# Patient Record
Sex: Female | Born: 1970 | Race: White | Hispanic: No | Marital: Married | Smoking: Former smoker
Health system: Southern US, Community
[De-identification: ages and names within clinical notes are randomized; demographics above are authoritative.]

## PROBLEM LIST (undated history)

## (undated) DIAGNOSIS — F411 Generalized anxiety disorder: Secondary | ICD-10-CM

## (undated) DIAGNOSIS — K219 Gastro-esophageal reflux disease without esophagitis: Secondary | ICD-10-CM

## (undated) DIAGNOSIS — M1990 Unspecified osteoarthritis, unspecified site: Secondary | ICD-10-CM

## (undated) DIAGNOSIS — F329 Major depressive disorder, single episode, unspecified: Secondary | ICD-10-CM

## (undated) DIAGNOSIS — F32A Depression, unspecified: Secondary | ICD-10-CM

## (undated) DIAGNOSIS — F419 Anxiety disorder, unspecified: Secondary | ICD-10-CM

## (undated) DIAGNOSIS — K449 Diaphragmatic hernia without obstruction or gangrene: Secondary | ICD-10-CM

## (undated) DIAGNOSIS — I10 Essential (primary) hypertension: Secondary | ICD-10-CM

## (undated) DIAGNOSIS — C4491 Basal cell carcinoma of skin, unspecified: Secondary | ICD-10-CM

## (undated) DIAGNOSIS — R011 Cardiac murmur, unspecified: Secondary | ICD-10-CM

## (undated) DIAGNOSIS — Z5189 Encounter for other specified aftercare: Secondary | ICD-10-CM

## (undated) DIAGNOSIS — D649 Anemia, unspecified: Secondary | ICD-10-CM

## (undated) DIAGNOSIS — G2581 Restless legs syndrome: Secondary | ICD-10-CM

## (undated) DIAGNOSIS — C4490 Unspecified malignant neoplasm of skin, unspecified: Secondary | ICD-10-CM

## (undated) DIAGNOSIS — Z87891 Personal history of nicotine dependence: Secondary | ICD-10-CM

## (undated) HISTORY — DX: Encounter for other specified aftercare: Z51.89

## (undated) HISTORY — PX: WISDOM TOOTH EXTRACTION: SHX21

## (undated) HISTORY — DX: Gastro-esophageal reflux disease without esophagitis: K21.9

## (undated) HISTORY — DX: Unspecified malignant neoplasm of skin, unspecified: C44.90

## (undated) HISTORY — DX: Anemia, unspecified: D64.9

## (undated) HISTORY — DX: Basal cell carcinoma of skin, unspecified: C44.91

## (undated) HISTORY — DX: Cardiac murmur, unspecified: R01.1

## (undated) HISTORY — PX: STRABISMUS SURGERY: SHX218

## (undated) HISTORY — PX: EYE SURGERY: SHX253

## (undated) HISTORY — DX: Depression, unspecified: F32.A

## (undated) HISTORY — DX: Unspecified osteoarthritis, unspecified site: M19.90

## (undated) HISTORY — DX: Major depressive disorder, single episode, unspecified: F32.9

## (undated) HISTORY — PX: CERVICAL ABLATION: SHX5771

## (undated) HISTORY — DX: Anxiety disorder, unspecified: F41.9

---

## 1996-04-21 HISTORY — PX: BUNIONECTOMY: SHX129

## 1999-04-22 HISTORY — PX: NASAL SINUS SURGERY: SHX719

## 2007-03-16 ENCOUNTER — Emergency Department: Payer: Self-pay | Admitting: Emergency Medicine

## 2007-03-16 ENCOUNTER — Other Ambulatory Visit: Payer: Self-pay

## 2007-06-11 ENCOUNTER — Ambulatory Visit: Payer: Self-pay

## 2007-06-19 ENCOUNTER — Emergency Department: Payer: Self-pay | Admitting: Internal Medicine

## 2007-06-19 ENCOUNTER — Other Ambulatory Visit: Payer: Self-pay

## 2011-05-31 ENCOUNTER — Ambulatory Visit: Payer: Self-pay

## 2013-02-18 ENCOUNTER — Ambulatory Visit: Payer: Self-pay | Admitting: Physician Assistant

## 2013-02-18 LAB — RAPID STREP-A WITH REFLX: Micro Text Report: NEGATIVE

## 2013-02-20 LAB — BETA STREP CULTURE(ARMC)

## 2015-06-12 ENCOUNTER — Ambulatory Visit
Admission: EM | Admit: 2015-06-12 | Discharge: 2015-06-12 | Disposition: A | Discharge status: Home / Self Care | Payer: BLUE CROSS/BLUE SHIELD | Attending: Family Medicine | Admitting: Family Medicine

## 2015-06-12 ENCOUNTER — Encounter: Payer: Self-pay | Admitting: *Deleted

## 2015-06-12 ENCOUNTER — Ambulatory Visit (INDEPENDENT_AMBULATORY_CARE_PROVIDER_SITE_OTHER): Payer: BLUE CROSS/BLUE SHIELD

## 2015-06-12 DIAGNOSIS — M5030 Other cervical disc degeneration, unspecified cervical region: Secondary | ICD-10-CM

## 2015-06-12 DIAGNOSIS — M5412 Radiculopathy, cervical region: Secondary | ICD-10-CM

## 2015-06-12 DIAGNOSIS — M6249 Contracture of muscle, multiple sites: Secondary | ICD-10-CM

## 2015-06-12 DIAGNOSIS — M62838 Other muscle spasm: Secondary | ICD-10-CM

## 2015-06-12 HISTORY — DX: Essential (primary) hypertension: I10

## 2015-06-12 MED ORDER — NAPROXEN 500 MG PO TABS
500.0000 mg | ORAL_TABLET | Freq: Two times a day (BID) | ORAL | Status: DC
Start: 2015-06-12 — End: 2016-01-23

## 2015-06-12 MED ORDER — KETOROLAC TROMETHAMINE 60 MG/2ML IM SOLN: 60.0000 mg | Freq: Once | INTRAMUSCULAR | Status: DC

## 2015-06-12 MED ORDER — DIAZEPAM 2 MG PO TABS: 2.0000 mg | ORAL_TABLET | Freq: Three times a day (TID) | ORAL | Status: DC

## 2015-06-12 MED ORDER — METAXALONE 800 MG PO TABS: 800.0000 mg | ORAL_TABLET | Freq: Three times a day (TID) | ORAL | Status: DC

## 2015-06-12 MED ORDER — KETOROLAC TROMETHAMINE 60 MG/2ML IM SOLN
60.0000 mg | Freq: Once | INTRAMUSCULAR | Status: AC
  Administered 2015-06-12: 60 mg via INTRAMUSCULAR

## 2015-06-12 NOTE — Discharge Instructions (Signed)
Cervical Radiculopathy Cervical radiculopathy happens when a nerve in the neck (cervical nerve) is pinched or bruised. This condition can develop because of an injury or as part of the normal aging process. Pressure on the cervical nerves can cause pain or numbness that runs from the neck all the way down into the arm and fingers. Usually, this condition gets better with rest. Treatment may be needed if the condition does not improve.  CAUSES This condition may be caused by:  Injury.  Slipped (herniated) disk.  Muscle tightness in the neck because of overuse.  Arthritis.  Breakdown or degeneration in the bones and joints of the spine (spondylosis) due to aging.  Bone spurs that may develop near the cervical nerves. SYMPTOMS Symptoms of this condition include:  Pain that runs from the neck to the arm and hand. The pain can be severe or irritating. It may be worse when the neck is moved.  Numbness or weakness in the affected arm and hand. DIAGNOSIS This condition may be diagnosed based on symptoms, medical history, and a physical exam. You may also have tests, including:  X-rays.  CT scan.  MRI.  Electromyogram (EMG).  Nerve conduction tests. TREATMENT In many cases, treatment is not needed for this condition. With rest, the condition usually gets better over time. If treatment is needed, options may include:  Wearing a soft neck collar for short periods of time.  Physical therapy to strengthen your neck muscles.  Medicines, such as NSAIDs, oral corticosteroids, or spinal injections.  Surgery. This may be needed if other treatments do not help. Various types of surgery may be done depending on the cause of your problems. HOME CARE INSTRUCTIONS Managing Pain  Take over-the-counter and prescription medicines only as told by your health care provider.  If directed, apply ice to the affected area.  Put ice in a plastic bag.  Place a towel between your skin and the  bag.  Leave the ice on for 20 minutes, 2-3 times per day.  If ice does not help, you can try using heat. Take a warm shower or warm bath, or use a heat pack as told by your health care provider.  Try a gentle neck and shoulder massage to help relieve symptoms. Activity  Rest as needed. Follow instructions from your health care provider about any restrictions on activities.  Do stretching and strengthening exercises as told by your health care provider or physical therapist. General Instructions  If you were given a soft collar, wear it as told by your health care provider.  Use a flat pillow when you sleep.  Keep all follow-up visits as told by your health care provider. This is important. SEEK MEDICAL CARE IF:  Your condition does not improve with treatment. SEEK IMMEDIATE MEDICAL CARE IF:  Your pain gets much worse and cannot be controlled with medicines.  You have weakness or numbness in your hand, arm, face, or leg.  You have a high fever.  You have a stiff, rigid neck.  You lose control of your bowels or your bladder (have incontinence).  You have trouble with walking, balance, or speaking.   This information is not intended to replace advice given to you by your health care provider. Make sure you discuss any questions you have with your health care provider.   Document Released: 12/31/2000 Document Revised: 12/27/2014 Document Reviewed: 06/01/2014 Elsevier Interactive Patient Education 2016 Elsevier Inc.  Degenerative Disk Disease Degenerative disk disease is a condition caused by the changes  that occur in spinal disks as you grow older. Spinal disks are soft and compressible disks located between the bones of your spine (vertebrae). These disks act like shock absorbers. Degenerative disk disease can affect the whole spine. However, the neck and lower back are most commonly affected. Many changes can occur in the spinal disks with aging, such as:  The spinal disks  may dry and shrink.  Small tears may occur in the tough, outer covering of the disk (annulus).  The disk space may become smaller due to loss of water.  Abnormal growths in the bone (spurs) may occur. This can put pressure on the nerve roots exiting the spinal canal, causing pain.  The spinal canal may become narrowed. RISK FACTORS   Being overweight.  Having a family history of degenerative disk disease.  Smoking.  There is increased risk if you are doing heavy lifting or have a sudden injury. SIGNS AND SYMPTOMS  Symptoms vary from person to person and may include:  Pain that varies in intensity. Some people have no pain, while others have severe pain. The location of the pain depends on the part of your backbone that is affected.  You will have neck or arm pain if a disk in the neck area is affected.  You will have pain in your back, buttocks, or legs if a disk in the lower back is affected.  Pain that becomes worse while bending, reaching up, or with twisting movements.  Pain that may start gradually and then get worse as time passes. It may also start after a major or minor injury.  Numbness or tingling in the arms or legs. DIAGNOSIS  Your health care provider will ask you about your symptoms and about activities or habits that may cause the pain. He or she may also ask about any injuries, diseases, or treatments you have had. Your health care provider will examine you to check for the range of movement that is possible in the affected area, to check for strength in your extremities, and to check for sensation in the areas of the arms and legs supplied by different nerve roots. You may also have:   An X-ray of the spine.  Other imaging tests, such as MRI. TREATMENT  Your health care provider will advise you on the best plan for treatment. Treatment may include:  Medicines.  Rehabilitation exercises. HOME CARE INSTRUCTIONS   Follow proper lifting and walking  techniques as advised by your health care provider.  Maintain good posture.  Exercise regularly as advised by your health care provider.  Perform relaxation exercises.  Change your sitting, standing, and sleeping habits as advised by your health care provider.  Change positions frequently.  Lose weight or maintain a healthy weight as advised by your health care provider.  Do not use any tobacco products, including cigarettes, chewing tobacco, or electronic cigarettes. If you need help quitting, ask your health care provider.  Wear supportive footwear.  Take medicines only as directed by your health care provider. SEEK MEDICAL CARE IF:   Your pain does not go away within 1-4 weeks.  You have significant appetite or weight loss. SEEK IMMEDIATE MEDICAL CARE IF:   Your pain is severe.  You notice weakness in your arms, hands, or legs.  You begin to lose control of your bladder or bowel movements.  You have fevers or night sweats. MAKE SURE YOU:   Understand these instructions.  Will watch your condition.  Will get help right away  if you are not doing well or get worse.   This information is not intended to replace advice given to you by your health care provider. Make sure you discuss any questions you have with your health care provider.   Document Released: 02/02/2007 Document Revised: 04/28/2014 Document Reviewed: 08/09/2013 Elsevier Interactive Patient Education [employer].
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
## 2015-06-12 NOTE — ED Notes (Signed)
C/o gradual onset left sided neck pain over past several months, worse past 2 nights. Denies injury, paresthesias. States feels knot at left upper neck.

## 2015-12-18 NOTE — Progress Notes (Signed)
[facility] day:  12/19/2015  Chief Complaint: Lisa Vasquez is a 45 y.o. female with iron deficiency who is referred in consultation by Pasup Roxan Hockey, PA for assessment and management.  HPI: The patient has a history of iron deficiency anemia approximately 3-4 years ago. She seen at [facility] by Dr. Barkley Bruns..  As she was intolerant to oral iron supplements, she received IV iron.  She described receiving 2 infusions 3 weeks apart  In 07/2012. She tolerated her infusions well..   She has a history of menorrhagia is status post endometrial ablation in 2004.  She has had no menses since that time. She denies any blood in her stool or urine.  She has a history of gastritis/gastric erosions on EGD.  She states that she underwent EGD and colonoscopy about 3-4 years ago in the [facility] system which were negative. She was told she had a "nice colonoscopy".  She had a hiatal hernia with gastritis. She has been on omeprazole since that time.  She notes recently seeing black tarry stools. She is not on oral iron. She gets "physically ill" if she takes iron..  Regarding her diet, she eats yogurt and fruit for breakfast.  She tries to eat healthy with salad and grilled chicken. At times, she skips meals.  She skipped dinner 3 times this week. She describes having a bag of popcorn for dinner on Sunday night. She does not eat red meat. She mostly eats chicken, [place], and salmon. She eats green leafy vegetables. She has had ice pica since 04/2015.  Symptomatically, she feels "like shit".  She is aggravated as she is limited in her exercise and activity. She has a brown belt in tae kwon do.  She walks to the mailbox and back which is 20-30 feet and feels lightheaded.  This has caused frustration and depression.  She denies any B symptoms   Past Medical History:  Diagnosis Date  . Anemia   . Hypertension   . Skin cancer     Past Surgical  History:  Procedure Laterality Date  . CERVICAL ABLATION    . EYE SURGERY    . NASAL SINUS SURGERY  2001  . STRABISMUS SURGERY Right   . WISDOM TOOTH EXTRACTION      Family History  Problem Relation Age of Onset  . COPD Mother   . Skin cancer Mother   . Heart defect Mother   . Heart attack Maternal Aunt   . Heart attack Maternal Uncle   . Breast cancer Maternal Grandmother   . Melanoma Maternal Uncle     Social History:  reports that she has quit smoking. She does not have any smokeless tobacco history on file. She reports that she drinks alcohol. Her drug history is not on file.  She has a brown belt in tae kwon do. She lives in [place].  The patient is alone today.  Allergies:  Allergies  Allergen Reactions  . Ondansetron Other (See Comments)    Constipation     Current Medications: Current Outpatient Prescriptions  Medication Sig Dispense Refill  . lisinopril (PRINIVIL,ZESTRIL) 10 MG tablet Take 10 mg by mouth daily.    . nortriptyline (PAMELOR) 75 MG capsule Take 90 mg by mouth at bedtime.    Marland Kitchen omeprazole (PRILOSEC) 40 MG capsule Take 40 mg by mouth daily.    . rizatriptan (MAXALT) 10 MG tablet Take 10 mg by mouth as needed for migraine. May  repeat in 2 hours if needed    . sertraline (ZOLOFT) 100 MG tablet Take 200 mg by mouth daily.    . simvastatin (ZOCOR) 40 MG tablet Take 40 mg by mouth daily.    . diazepam (VALIUM) 2 MG tablet Take 1 tablet (2 mg total) by mouth 3 (three) times daily. (Patient not taking: Reported on 12/19/2015) 6 tablet 0  . metaxalone (SKELAXIN) 800 MG tablet Take 1 tablet (800 mg total) by mouth 3 (three) times daily. Start taking after finishing the valium for muscle spasm. (Patient not taking: Reported on 12/19/2015) 21 tablet 0  . naproxen (NAPROSYN) 500 MG tablet Take 1 tablet (500 mg total) by mouth 2 (two) times daily with a meal. (Patient not taking: Reported on 12/19/2015) 60 tablet 0   No current facility-administered medications for this  visit.     Review of Systems:  GENERAL:  Fatigued.  No fevers, sweats or weight loss. PERFORMANCE STATUS (ECOG):  2 HEENT:  No visual changes, runny nose, sore throat, mouth sores or tenderness. Lungs: No shortness of breath or cough.  No hemoptysis. Cardiac:  No chest pain, palpitations, orthopnea, or PND. GI:  Recent black tarry stools.  Nausea in AM.  Water emesis.  No diarrhea, constipation, or hematochezia. GU:  No menses s/p uterine ablation.  No urgency, frequency, dysuria, or hematuria. Musculoskeletal:  No back pain.  Arthritis in neck.  No muscle tenderness. Extremities:  No pain or swelling. Skin:  No rashes or skin changes. Neuro:  Lightheaded with activity.  No headache, numbness or weakness, balance or coordination issues. Endocrine:  No diabetes, thyroid issues.  Hot flashes- takes black cohash.  No night sweats. Psych:  Aggravated.  Depression.  No anxiety. Pain:  No focal pain. Review of systems:  All other systems reviewed and found to be negative.  Physical Exam: Blood pressure (!) 162/114, pulse 79, temperature 98.4 F (36.9 C), temperature source Tympanic, resp. rate 18, height 5\' 6"  (1.676 m), weight 235 lb 7.2 oz (106.8 kg). GENERAL:  Well developed, well nourished, sitting comfortably in the exam room in no acute distress. MENTAL STATUS:  Alert and oriented to person, place and time. HEAD:  Normocephalic, atraumatic, face symmetric, no Cushingoid features. EYES:  Glasses.eyes.  Pupils equal round and reactive to light and accomodation.  No conjunctivitis or scleral icterus. ENT:  Oropharynx clear without lesion.  Tongue normal. Mucous membranes moist.  RESPIRATORY:  Clear to auscultation without rales, wheezes or rhonchi. CARDIOVASCULAR:  Regular rate and rhythm without murmur, rub or gallop. ABDOMEN:  Soft, non-tender, with active bowel sounds, and no hepatosplenomegaly.  No masses. SKIN:  No rashes, ulcers or lesions. EXTREMITIES: No edema, no skin  discoloration or tenderness.  No palpable cords. LYMPH NODES: No palpable cervical, supraclavicular, axillary or inguinal adenopathy  NEUROLOGICAL: Unremarkable. PSYCH:  Appropriate.  No visits with results within 3 Day(s) from this visit.  Latest known visit with results is:  [facility] Conversion on 02/18/2013  Component Date Value Ref Range Status  . Micro Text Report 02/18/2013 NEGATIVE   Final  . Micro Text Report 02/20/2013    Final                   Value:   SOURCE: THROAT    ORGANISM 1                MODERATE GROWTH GROUP F STREPTOCOCCUS   COMMENT                   -  ANTIBIOTIC                                                        Assessment:  Lisa Vasquez is a 45 y.o. female with a history of iron deficiency anemia years ago. EGD and colonoscopy 3-4 years ago revealed gastritis only. She has been on omeprazole since that time.  She has a history of menorrhagia and status post uterine uterine ablation in 2004.   She has had no menses since that time.  Diet is modest.  Patient received IV iron at [facility] in 07/2012. She tolerated her infusions well.  She is intolerant of oral iron.  She notes a recent history of black tarry stools. She is not on oral iron.  She has had ice pica since 04/2015.  Plan: 1.  Discussed the iagnosis and management of iron deficiency anemia.  By history, she previously had heavy menses. She has had no menses since her uterine ablation. EGD and colonoscopy were negative.  Her diet is modest.  Concern is raised for upper GI bleeding given her recent black and tarry stools. 2.  Labs today:  CBC with differential, CMP, ferritin, iron studies, B12, folate. 3.  Guaiac cars x 3. 4.  Obtain records from [facility] on [address] [place] phone [phone] 5.  Preauth IV iron (same was [facility]). 6.  RTC for MD assess and IV iron.   Lequita Asal, MD  12/19/2015, 11:21 AM

## 2015-12-19 ENCOUNTER — Inpatient Hospital Stay: Payer: BLUE CROSS/BLUE SHIELD | Attending: Hematology and Oncology | Admitting: Hematology and Oncology

## 2015-12-19 ENCOUNTER — Encounter: Payer: Self-pay | Admitting: Hematology and Oncology

## 2015-12-19 ENCOUNTER — Inpatient Hospital Stay: Payer: BLUE CROSS/BLUE SHIELD

## 2015-12-19 DIAGNOSIS — D649 Anemia, unspecified: Secondary | ICD-10-CM

## 2015-12-19 DIAGNOSIS — Z87891 Personal history of nicotine dependence: Secondary | ICD-10-CM | POA: Insufficient documentation

## 2015-12-19 DIAGNOSIS — F5089 Other specified eating disorder: Secondary | ICD-10-CM | POA: Insufficient documentation

## 2015-12-19 DIAGNOSIS — N920 Excessive and frequent menstruation with regular cycle: Secondary | ICD-10-CM | POA: Insufficient documentation

## 2015-12-19 DIAGNOSIS — F329 Major depressive disorder, single episode, unspecified: Secondary | ICD-10-CM | POA: Insufficient documentation

## 2015-12-19 DIAGNOSIS — K921 Melena: Secondary | ICD-10-CM | POA: Diagnosis not present

## 2015-12-19 DIAGNOSIS — Z79899 Other long term (current) drug therapy: Secondary | ICD-10-CM | POA: Diagnosis not present

## 2015-12-19 DIAGNOSIS — K449 Diaphragmatic hernia without obstruction or gangrene: Secondary | ICD-10-CM | POA: Insufficient documentation

## 2015-12-19 DIAGNOSIS — D509 Iron deficiency anemia, unspecified: Secondary | ICD-10-CM | POA: Insufficient documentation

## 2015-12-19 DIAGNOSIS — I10 Essential (primary) hypertension: Secondary | ICD-10-CM | POA: Diagnosis not present

## 2015-12-19 LAB — IRON AND TIBC
Iron: 13 ug/dL — ABNORMAL LOW (ref 28–170)
Saturation Ratios: 2 % — ABNORMAL LOW (ref 10.4–31.8)
TIBC: 539 ug/dL — ABNORMAL HIGH (ref 250–450)
UIBC: 526 ug/dL

## 2015-12-19 LAB — COMPREHENSIVE METABOLIC PANEL
ALT: 21 U/L (ref 14–54)
AST: 26 U/L (ref 15–41)
Albumin: 3.9 g/dL (ref 3.5–5.0)
Alkaline Phosphatase: 56 U/L (ref 38–126)
Anion gap: 7 (ref 5–15)
BUN: 10 mg/dL (ref 6–20)
CO2: 25 mmol/L (ref 22–32)
Calcium: 8.6 mg/dL — ABNORMAL LOW (ref 8.9–10.3)
Chloride: 103 mmol/L (ref 101–111)
Creatinine, Ser: 0.95 mg/dL (ref 0.44–1.00)
GFR calc Af Amer: >60 mL/min (ref >60)
GFR calc non Af Amer: >60 mL/min (ref >60)
Glucose, Bld: 96 mg/dL (ref 65–99)
Potassium: 3.9 mmol/L (ref 3.5–5.1)
Sodium: 135 mmol/L (ref 135–145)
Total Bilirubin: 0.5 mg/dL (ref 0.3–1.2)
Total Protein: 7.1 g/dL (ref 6.5–8.1)

## 2015-12-19 LAB — FERRITIN: Ferritin: 9 ng/mL — ABNORMAL LOW (ref 11–307)

## 2015-12-19 LAB — CBC WITH DIFFERENTIAL/PLATELET
Basophils Absolute: 0.1 10*3/uL (ref 0–0.1)
Basophils Relative: 2 %
Eosinophils Absolute: 0.3 10*3/uL (ref 0–0.7)
Eosinophils Relative: 7 %
HCT: 27.8 % — ABNORMAL LOW (ref 35.0–47.0)
Hemoglobin: 8.4 g/dL — ABNORMAL LOW (ref 12.0–16.0)
Lymphocytes Relative: 24 %
Lymphs Abs: 1.1 10*3/uL (ref 1.0–3.6)
MCH: 20.5 pg — ABNORMAL LOW (ref 26.0–34.0)
MCHC: 30.3 g/dL — ABNORMAL LOW (ref 32.0–36.0)
MCV: 67.7 fL — ABNORMAL LOW (ref 80.0–100.0)
Monocytes Absolute: 0.4 10*3/uL (ref 0.2–0.9)
Monocytes Relative: 9 %
Neutro Abs: 2.8 10*3/uL (ref 1.4–6.5)
Neutrophils Relative %: 58 %
Platelets: 226 10*3/uL (ref 150–440)
RBC: 4.11 MIL/uL (ref 3.80–5.20)
RDW: 18 % — ABNORMAL HIGH (ref 11.5–14.5)
WBC: 4.7 10*3/uL (ref 3.6–11.0)

## 2015-12-19 LAB — FOLATE: Folate: 11.9 ng/mL (ref >5.9)

## 2015-12-19 LAB — VITAMIN B12: Vitamin B-12: 263 pg/mL (ref 180–914)

## 2015-12-19 NOTE — Progress Notes (Signed)
New patient evaluation.   

## 2015-12-19 NOTE — Progress Notes (Signed)
We rechecked pt's b/p at 11:03 and 160/100 p-76. Pt was nauseated because she had not ate anything this morning just in case she needed fasting blood work. I got her crackers and gingerale and she felt better recheck on b/p 148/102 at 11:36 with p-78 and then rechcek at 12:01 b/p 144/92 p-85.  We tried calling her PCP Dr. Leafy Kindle and the office was closed and paged md on call 3 times and no answer.  Pt felt good and she ahs b/p cuff at home and she will keep monitoring it and if her bottom number is over 100 she will call PCP and we will try calling PCP tom when the office is open.  Later this afternoon after pt already left Dr. Elder Love called back and he will call pt and make app for her.

## 2015-12-21 ENCOUNTER — Other Ambulatory Visit: Payer: Self-pay | Admitting: Hematology and Oncology

## 2015-12-21 ENCOUNTER — Telehealth: Payer: Self-pay | Admitting: *Deleted

## 2015-12-21 NOTE — Telephone Encounter (Signed)
-----   Message from Lequita Asal, MD sent at 12/21/2015 12:46 PM EDT ----- Regarding: B12 low normal  Order in for MMA at next blood draw.  M ----- Message ----- From: Interface, Lab In [place] Sent: 12/19/2015  12:22 PM To: Lequita Asal, MD

## 2015-12-21 NOTE — Telephone Encounter (Signed)
Called pt and let her know that the doctor wanted her to have a lab drawn when she comes back on 9/6. Pt agreeable to coming early and having labs work done.

## 2015-12-23 ENCOUNTER — Encounter: Payer: Self-pay | Admitting: Hematology and Oncology

## 2015-12-26 ENCOUNTER — Inpatient Hospital Stay: Payer: BLUE CROSS/BLUE SHIELD | Attending: Hematology and Oncology | Admitting: Hematology and Oncology

## 2015-12-26 ENCOUNTER — Telehealth: Payer: Self-pay | Admitting: *Deleted

## 2015-12-26 ENCOUNTER — Other Ambulatory Visit: Payer: Self-pay | Admitting: Hematology and Oncology

## 2015-12-26 ENCOUNTER — Other Ambulatory Visit: Payer: Self-pay | Admitting: *Deleted

## 2015-12-26 ENCOUNTER — Encounter: Payer: Self-pay | Admitting: *Deleted

## 2015-12-26 ENCOUNTER — Inpatient Hospital Stay: Payer: BLUE CROSS/BLUE SHIELD

## 2015-12-26 VITALS — BP 141/92 | HR 81 | Resp 18

## 2015-12-26 DIAGNOSIS — K2970 Gastritis, unspecified, without bleeding: Secondary | ICD-10-CM

## 2015-12-26 DIAGNOSIS — Z79899 Other long term (current) drug therapy: Secondary | ICD-10-CM | POA: Insufficient documentation

## 2015-12-26 DIAGNOSIS — R51 Headache: Secondary | ICD-10-CM | POA: Diagnosis not present

## 2015-12-26 DIAGNOSIS — D509 Iron deficiency anemia, unspecified: Secondary | ICD-10-CM | POA: Insufficient documentation

## 2015-12-26 DIAGNOSIS — N920 Excessive and frequent menstruation with regular cycle: Secondary | ICD-10-CM

## 2015-12-26 DIAGNOSIS — I10 Essential (primary) hypertension: Secondary | ICD-10-CM | POA: Insufficient documentation

## 2015-12-26 DIAGNOSIS — Z87891 Personal history of nicotine dependence: Secondary | ICD-10-CM | POA: Diagnosis not present

## 2015-12-26 DIAGNOSIS — D649 Anemia, unspecified: Secondary | ICD-10-CM

## 2015-12-26 DIAGNOSIS — E538 Deficiency of other specified B group vitamins: Secondary | ICD-10-CM | POA: Diagnosis not present

## 2015-12-26 LAB — OCCULT BLOOD X 1 CARD TO LAB, STOOL
Fecal Occult Bld: NEGATIVE
Fecal Occult Bld: NEGATIVE

## 2015-12-26 MED ORDER — IRON SUCROSE 20 MG/ML IV SOLN
200.0000 mg | Freq: Once | INTRAVENOUS | Status: AC
  Administered 2015-12-26: 200 mg via INTRAVENOUS
  Filled 2015-12-26: qty 10

## 2015-12-26 MED ORDER — SODIUM CHLORIDE 0.9 % IV SOLN
Freq: Once | INTRAVENOUS | Status: AC
  Administered 2015-12-26: 11:00:00 via INTRAVENOUS
  Filled 2015-12-26: qty 1000

## 2015-12-26 NOTE — Progress Notes (Signed)
Patient's BP medication expired and she has had to reorder.  She states she has been without it for about a week.  BP elevated 162/97.  Patient complains of headache. 4/10.

## 2015-12-26 NOTE — Telephone Encounter (Signed)
The nurse from Dr. Carollee Herter office called [place] back .dr Mike Gip  Wanted to know what kind of iron pt rcvd at their facility. Last time pt got any was 07/30/2012 and it was 750 mg of Injectofer and she rcvd 2 doses 1 week apart

## 2015-12-26 NOTE — Progress Notes (Signed)
[facility] day:  12/26/2015  Chief Complaint: Lisa Vasquez is a 45 y.o. female with iron deficiency who is seen for review of initial work-up and initiation of IV iron.  HPI: The patient was last seen in the hematology clinic on 12/19/2015.  At that time, she was seen for initial consultation.  She had a history of iron deficiency anemia years ago. EGD and colonoscopy 3-4 years ago revealed gastritis only. She has been on omeprazole since that time.  She had a history of menorrhagia and was status post uterine uterine ablation in 2004.   She had no menses since that time.  Diet was modest.  She had received IV iron at [facility] (07/2012).  At last visit, she noted black tarry stools. She was not on oral iron.  She had ice pica.  Workup included a hematocrit of 27.8, hemoglobin 8.4, MCV 67.7, platelets 226,000, white count 4700 with an ANC of 2800.  Ferritin was 9.  Iron saturation was 2% with a TIBC of 539.  B12 was 263 (low normal).  Folate was 9. Comprehensive metabolic panel was normal except for slightly low calcium of 8.6.   Guaiac cards 3 were negative.  Symptomatically, she has a headache today.  Blood pressure is elevated.  She has been out of her blood pressure medication for 1 week.   Past Medical History:  Diagnosis Date  . Anemia   . Hypertension   . Skin cancer     Past Surgical History:  Procedure Laterality Date  . CERVICAL ABLATION    . EYE SURGERY    . NASAL SINUS SURGERY  2001  . STRABISMUS SURGERY Right   . WISDOM TOOTH EXTRACTION      Family History  Problem Relation Age of Onset  . COPD Mother   . Skin cancer Mother   . Heart defect Mother   . Heart attack Maternal Aunt   . Heart attack Maternal Uncle   . Breast cancer Maternal Grandmother   . Melanoma Maternal Uncle     Social History:  reports that she has quit smoking. She does not have any smokeless tobacco history on file. She reports that she  drinks alcohol. Her drug history is not on file.  She has a brown belt in tae kwon do. She lives in [place].  The patient is alone today.  Allergies:  Allergies  Allergen Reactions  . Ondansetron Other (See Comments)    Constipation     Current Medications: Current Outpatient Prescriptions  Medication Sig Dispense Refill  . diazepam (VALIUM) 2 MG tablet Take 1 tablet (2 mg total) by mouth 3 (three) times daily. 6 tablet 0  . lisinopril (PRINIVIL,ZESTRIL) 10 MG tablet Take 10 mg by mouth daily.    . metaxalone (SKELAXIN) 800 MG tablet Take 1 tablet (800 mg total) by mouth 3 (three) times daily. Start taking after finishing the valium for muscle spasm. 21 tablet 0  . naproxen (NAPROSYN) 500 MG tablet Take 1 tablet (500 mg total) by mouth 2 (two) times daily with a meal. 60 tablet 0  . nortriptyline (PAMELOR) 75 MG capsule Take 90 mg by mouth at bedtime.    Marland Kitchen omeprazole (PRILOSEC) 40 MG capsule Take 40 mg by mouth daily.    . rizatriptan (MAXALT) 10 MG tablet Take 10 mg by mouth as needed for migraine. May repeat in 2 hours if needed    . sertraline (ZOLOFT) 100 MG tablet Take  200 mg by mouth daily.    . simvastatin (ZOCOR) 40 MG tablet Take 40 mg by mouth daily.     No current facility-administered medications for this visit.     Review of Systems:  GENERAL:  Fatigued.  No fevers or sweats.  Weight down 5 pounds. PERFORMANCE STATUS (ECOG):  2 HEENT:  No visual changes, runny nose, sore throat, mouth sores or tenderness. Lungs: No shortness of breath or cough.  No hemoptysis. Cardiac:  No chest pain, palpitations, orthopnea, or PND. GI:  Nausea in AM.  Water emesis.  No diarrhea, constipation, or hematochezia. GU:  No menses s/p uterine ablation.  No urgency, frequency, dysuria, or hematuria. Musculoskeletal:  No back pain.  Arthritis in neck.  No muscle tenderness. Extremities:  No pain or swelling. Skin:  No rashes or skin changes. Neuro:  Lightheaded with activity.  No headache,  numbness or weakness, balance or coordination issues. Endocrine:  No diabetes, thyroid issues.  Hot flashes- takes black cohash.  No night sweats. Psych:  Aggravated.  Depression.  No anxiety. Pain:  No focal pain. Review of systems:  All other systems reviewed and found to be negative.  Physical Exam: Blood pressure (!) 162/97, pulse 88, temperature 97.3 F (36.3 C), temperature source Tympanic, resp. rate 18, weight 230 lb 9.6 oz (104.6 kg). GENERAL:  Well developed, well nourished, sitting comfortably in the exam room in no acute distress. MENTAL STATUS:  Alert and oriented to person, place and time. HEAD:  Normocephalic, atraumatic, face symmetric, no Cushingoid features. EYES:  Glasses.  Pupils equal round and reactive to light and accomodation.  No conjunctivitis or scleral icterus. ENT:  Oropharynx clear without lesion.  Tongue normal. Mucous membranes moist.  RESPIRATORY:  Clear to auscultation without rales, wheezes or rhonchi. CARDIOVASCULAR:  Regular rate and rhythm without murmur, rub or gallop. ABDOMEN:  Soft, non-tender, with active bowel sounds, and no hepatosplenomegaly.  No masses. SKIN:  No rashes, ulcers or lesions. EXTREMITIES: No edema, no skin discoloration or tenderness.  No palpable cords. LYMPH NODES: No palpable cervical, supraclavicular, axillary or inguinal adenopathy  NEUROLOGICAL: Unremarkable. PSYCH:  Appropriate.   Appointment on 12/26/2015  Component Date Value Ref Range Status  . Fecal Occult Bld 12/26/2015 NEGATIVE  NEGATIVE Final  . Fecal Occult Bld 12/26/2015 NEGATIVE  NEGATIVE Final    Assessment:  Lisa Vasquez is a 45 y.o. female with iron deficiency anemia. EGD and colonoscopy 3-4 years ago revealed gastritis only. She has been on omeprazole since that time.  She has a history of menorrhagia and status post uterine uterine ablation in 2004.   She has had no menses since that time.  Diet is modest.  She has had ice pica since 04/2015.  She  received IV iron at [facility] in 07/2012. She tolerated her infusions well.  She is intolerant of oral iron.  Workup on 12/19/2015 revealed a hematocrit of 27.8, hemoglobin 8.4, MCV 67.7, platelets 226,000, white count 4700 with an ANC of 2800.  Ferritin was 9 (low) with an saturation was 2% with a TIBC of 539 (high) c/w iron deficiency.  B12 was 263 (low normal).  Folate was 9. Guaiac cards 3 were negative.  She has a headache.  Her blood pressure is elevated.  She ran out of her blood pressure medication 1 week ago.   Plan: 1.  Review of work-up and diagnosis of isolated iron deficiency.  Discuss initiation of Venofer x 3 then reassessment. 2.  Venofer today 3.  Labs:  MMA to r/o B12 deficiency. 4.  RTC weekly (09/13, 09/20, 09/27) x 3 for Venofer.  5.  Add labs on 01/16/2016 including CBC and ferritin. 6.  Follow-up with PCP regarding blood pressure. 7.  RTC on 01/23/2016 for MD assess and +/- Venofer.   Lequita Asal, MD  12/26/2015, 10:09 AM

## 2015-12-29 LAB — METHYLMALONIC ACID, SERUM: Methylmalonic Acid, Quantitative: 77 nmol/L (ref 0–378)

## 2016-01-02 ENCOUNTER — Inpatient Hospital Stay: Payer: BLUE CROSS/BLUE SHIELD

## 2016-01-02 VITALS — BP 119/81 | HR 87 | Temp 98.2°F | Resp 18

## 2016-01-02 DIAGNOSIS — D509 Iron deficiency anemia, unspecified: Secondary | ICD-10-CM

## 2016-01-02 MED ORDER — SODIUM CHLORIDE 0.9 % IV SOLN
Freq: Once | INTRAVENOUS | Status: AC
  Administered 2016-01-02: 09:00:00 via INTRAVENOUS
  Filled 2016-01-02: qty 1000

## 2016-01-02 MED ORDER — SODIUM CHLORIDE 0.9 % IV SOLN
200.0000 mg | Freq: Once | INTRAVENOUS | Status: AC
  Administered 2016-01-02: 200 mg via INTRAVENOUS
  Filled 2016-01-02: qty 10

## 2016-01-09 ENCOUNTER — Inpatient Hospital Stay: Payer: BLUE CROSS/BLUE SHIELD

## 2016-01-09 VITALS — BP 105/73 | HR 91 | Temp 97.0°F | Resp 18

## 2016-01-09 DIAGNOSIS — D509 Iron deficiency anemia, unspecified: Secondary | ICD-10-CM

## 2016-01-09 MED ORDER — SODIUM CHLORIDE 0.9 % IV SOLN
200.0000 mg | Freq: Once | INTRAVENOUS | Status: AC
  Administered 2016-01-09: 200 mg via INTRAVENOUS
  Filled 2016-01-09: qty 10

## 2016-01-09 MED ORDER — SODIUM CHLORIDE 0.9 % IV SOLN
Freq: Once | INTRAVENOUS | Status: AC
  Administered 2016-01-09: 09:00:00 via INTRAVENOUS
  Filled 2016-01-09: qty 1000

## 2016-01-09 MED ORDER — SODIUM CHLORIDE 0.9 % IV SOLN
200.0000 mg | Freq: Once | INTRAVENOUS | Status: DC
  Filled 2016-01-09: qty 10

## 2016-01-16 ENCOUNTER — Inpatient Hospital Stay: Payer: BLUE CROSS/BLUE SHIELD

## 2016-01-16 VITALS — BP 116/81 | HR 84 | Temp 97.2°F | Resp 18

## 2016-01-16 DIAGNOSIS — D509 Iron deficiency anemia, unspecified: Secondary | ICD-10-CM | POA: Diagnosis not present

## 2016-01-16 LAB — CBC WITH DIFFERENTIAL/PLATELET
Basophils Absolute: 0.1 10*3/uL (ref 0–0.1)
Basophils Relative: 1 %
Eosinophils Absolute: 0.4 10*3/uL (ref 0–0.7)
Eosinophils Relative: 6 %
HCT: 35.5 % (ref 35.0–47.0)
Hemoglobin: 10.9 g/dL — ABNORMAL LOW (ref 12.0–16.0)
Lymphocytes Relative: 28 %
Lymphs Abs: 1.8 10*3/uL (ref 1.0–3.6)
MCH: 22.5 pg — ABNORMAL LOW (ref 26.0–34.0)
MCHC: 30.9 g/dL — ABNORMAL LOW (ref 32.0–36.0)
MCV: 72.9 fL — ABNORMAL LOW (ref 80.0–100.0)
Monocytes Absolute: 0.5 10*3/uL (ref 0.2–0.9)
Monocytes Relative: 8 %
Neutro Abs: 3.5 10*3/uL (ref 1.4–6.5)
Neutrophils Relative %: 57 %
Platelets: 264 10*3/uL (ref 150–440)
RBC: 4.87 MIL/uL (ref 3.80–5.20)
RDW: 26.6 % — ABNORMAL HIGH (ref 11.5–14.5)
WBC: 6.3 10*3/uL (ref 3.6–11.0)

## 2016-01-16 LAB — FERRITIN: Ferritin: 139 ng/mL (ref 11–307)

## 2016-01-16 MED ORDER — SODIUM CHLORIDE 0.9 % IV SOLN
Freq: Once | INTRAVENOUS | Status: AC
Start: 2016-01-16 — End: 2016-01-16
  Administered 2016-01-16: 09:00:00 via INTRAVENOUS
  Filled 2016-01-16: qty 1000

## 2016-01-16 MED ORDER — SODIUM CHLORIDE 0.9 % IV SOLN
200.0000 mg | Freq: Once | INTRAVENOUS | Status: AC
  Administered 2016-01-16: 200 mg via INTRAVENOUS
  Filled 2016-01-16: qty 10

## 2016-01-19 ENCOUNTER — Encounter: Payer: Self-pay | Admitting: Hematology and Oncology

## 2016-01-23 ENCOUNTER — Inpatient Hospital Stay: Payer: BLUE CROSS/BLUE SHIELD | Attending: Hematology and Oncology | Admitting: Hematology and Oncology

## 2016-01-23 ENCOUNTER — Encounter: Payer: Self-pay | Admitting: Hematology and Oncology

## 2016-01-23 ENCOUNTER — Inpatient Hospital Stay: Payer: BLUE CROSS/BLUE SHIELD

## 2016-01-23 VITALS — BP 124/90 | HR 103 | Temp 97.3°F | Resp 18 | Wt 229.5 lb

## 2016-01-23 DIAGNOSIS — Z79899 Other long term (current) drug therapy: Secondary | ICD-10-CM | POA: Diagnosis not present

## 2016-01-23 DIAGNOSIS — D509 Iron deficiency anemia, unspecified: Secondary | ICD-10-CM

## 2016-01-23 DIAGNOSIS — K2970 Gastritis, unspecified, without bleeding: Secondary | ICD-10-CM | POA: Diagnosis not present

## 2016-01-23 DIAGNOSIS — I10 Essential (primary) hypertension: Secondary | ICD-10-CM

## 2016-01-23 DIAGNOSIS — D508 Other iron deficiency anemias: Secondary | ICD-10-CM

## 2016-01-23 DIAGNOSIS — Z87891 Personal history of nicotine dependence: Secondary | ICD-10-CM | POA: Insufficient documentation

## 2016-01-23 DIAGNOSIS — Z85828 Personal history of other malignant neoplasm of skin: Secondary | ICD-10-CM | POA: Diagnosis not present

## 2016-01-23 DIAGNOSIS — N920 Excessive and frequent menstruation with regular cycle: Secondary | ICD-10-CM | POA: Insufficient documentation

## 2016-01-23 NOTE — Progress Notes (Signed)
[facility] day:  01/23/2016  Chief Complaint: Lisa Vasquez is a 45 y.o. female with iron deficiency who is seen for reassessment after initiation of IV iron.  HPI: The patient was last seen in the hematology clinic on 12/26/2015.  At that time, work-up was reviewed.  We discussed her diagnosis of isolated iron deficiency and plan to initiate weekly Venofer.  She had some issues with hypertension as she had run out of her medications x 1 week.  Blood pressure improved after getting her lisinopril refilled.  She received Venofer on 12/26/2015, 01/02/2016, 01/09/2016, and 01/16/2016.  CBC on 01/16/2016 revealed a hematocrit of 35.5, hemoglobin 10.9, and MCV 72.9.  Ferritin was 139.  MMA on 12/26/2015 was 77 (normal) ruling out B12 deficiency.  Symptomatically, she is feeling better.  Ice pica resolved after her second infusion.   Past Medical History:  Diagnosis Date  . Anemia   . Hypertension   . Skin cancer     Past Surgical History:  Procedure Laterality Date  . CERVICAL ABLATION    . EYE SURGERY    . NASAL SINUS SURGERY  2001  . STRABISMUS SURGERY Right   . WISDOM TOOTH EXTRACTION      Family History  Problem Relation Age of Onset  . COPD Mother   . Skin cancer Mother   . Heart defect Mother   . Heart attack Maternal Aunt   . Heart attack Maternal Uncle   . Breast cancer Maternal Grandmother   . Melanoma Maternal Uncle     Social History:  reports that she has quit smoking. She does not have any smokeless tobacco history on file. She reports that she drinks alcohol. Her drug history is not on file.  She has a brown belt in tae kwon do. She lives in [place].  The patient is alone today.  Allergies:  Allergies  Allergen Reactions  . Ondansetron Other (See Comments)    Constipation     Current Medications: Current Outpatient Prescriptions  Medication Sig Dispense Refill  . lisinopril (PRINIVIL,ZESTRIL) 10 MG tablet  Take 10 mg by mouth daily.    . nortriptyline (PAMELOR) 75 MG capsule Take 90 mg by mouth at bedtime.    Marland Kitchen omeprazole (PRILOSEC) 40 MG capsule Take 40 mg by mouth daily.    . rizatriptan (MAXALT) 10 MG tablet Take 10 mg by mouth as needed for migraine. May repeat in 2 hours if needed    . sertraline (ZOLOFT) 100 MG tablet Take 200 mg by mouth daily.    . simvastatin (ZOCOR) 40 MG tablet Take 40 mg by mouth daily.     No current facility-administered medications for this visit.     Review of Systems:  GENERAL:  Feels better.  No fevers or sweats.  Weight down 1 pound. PERFORMANCE STATUS (ECOG):  1 HEENT:  No visual changes, runny nose, sore throat, mouth sores or tenderness. Lungs: No shortness of breath or cough.  No hemoptysis. Cardiac:  No chest pain, palpitations, orthopnea, or PND. GI:  Once a day nausea/vomiting.  No diarrhea, constipation, or hematochezia. GU:  No menses s/p uterine ablation.  No urgency, frequency, dysuria, or hematuria. Musculoskeletal:  No back pain.  Arthritis in neck.  No muscle tenderness. Extremities:  No pain or swelling. Skin:  No rashes or skin changes. Neuro:  No headache, numbness or weakness, balance or coordination issues. Endocrine:  No diabetes, thyroid issues.  Hot flashes- takes black cohash.  No night sweats. Psych:  No concerns expressed today. Pain:  No focal pain. Review of systems:  All other systems reviewed and found to be negative.  Physical Exam: Blood pressure 124/90, pulse (!) 103, temperature 97.3 F (36.3 C), temperature source Tympanic, resp. rate 18, weight 229 lb 8 oz (104.1 kg). GENERAL:  Well developed, well nourished, sitting comfortably in the exam room in no acute distress. MENTAL STATUS:  Alert and oriented to person, place and time. HEAD:  Shoulder length hair.  Normocephalic, atraumatic, face symmetric, no Cushingoid features. EYES:  Glasses.  Pupils equal round and reactive to light and accomodation.  No conjunctivitis  or scleral icterus. ENT:  Oropharynx clear without lesion.  Tongue normal. Mucous membranes moist.  RESPIRATORY:  Clear to auscultation without rales, wheezes or rhonchi. CARDIOVASCULAR:  Regular rate and rhythm without murmur, rub or gallop. ABDOMEN:  Soft, non-tender, with active bowel sounds, and no hepatosplenomegaly.  No masses. SKIN:  No rashes, ulcers or lesions. EXTREMITIES: No edema, no skin discoloration or tenderness.  No palpable cords. LYMPH NODES: No palpable cervical, supraclavicular, axillary or inguinal adenopathy  NEUROLOGICAL: Unremarkable. PSYCH:  Appropriate.   No visits with results within 3 Day(s) from this visit.  Latest known visit with results is:  Appointment on 01/16/2016  Component Date Value Ref Range Status  . WBC 01/16/2016 6.3  3.6 - 11.0 K/uL Final  . RBC 01/16/2016 4.87  3.80 - 5.20 MIL/uL Final  . Hemoglobin 01/16/2016 10.9* 12.0 - 16.0 g/dL Final  . HCT 01/16/2016 35.5  35.0 - 47.0 % Final  . MCV 01/16/2016 72.9* 80.0 - 100.0 fL Final  . MCH 01/16/2016 22.5* 26.0 - 34.0 pg Final  . MCHC 01/16/2016 30.9* 32.0 - 36.0 g/dL Final  . RDW 01/16/2016 26.6* 11.5 - 14.5 % Final  . Platelets 01/16/2016 264  150 - 440 K/uL Final  . Neutrophils Relative % 01/16/2016 57  % Final  . Neutro Abs 01/16/2016 3.5  1.4 - 6.5 K/uL Final  . Lymphocytes Relative 01/16/2016 28  % Final  . Lymphs Abs 01/16/2016 1.8  1.0 - 3.6 K/uL Final  . Monocytes Relative 01/16/2016 8  % Final  . Monocytes Absolute 01/16/2016 0.5  0.2 - 0.9 K/uL Final  . Eosinophils Relative 01/16/2016 6  % Final  . Eosinophils Absolute 01/16/2016 0.4  0 - 0.7 K/uL Final  . Basophils Relative 01/16/2016 1  % Final  . Basophils Absolute 01/16/2016 0.1  0 - 0.1 K/uL Final  . Ferritin 01/16/2016 139  11 - 307 ng/mL Final    Assessment:  Lisa Vasquez is a 45 y.o. female with iron deficiency anemia. EGD and colonoscopy 3-4 years ago revealed gastritis only. She has been on omeprazole since that  time.  She has a history of menorrhagia and status post uterine uterine ablation in 2004.   She has had no menses since that time.  Diet is modest.  She is intolerant of oral iron.  She has had ice pica since 04/2015.  She received IV iron at [facility] in 07/2012.  She received Venofer 800 mg (12/26/2015 - 01/16/2016).  She tolerated her infusions well.  Workup on 12/19/2015 revealed a hematocrit of 27.8, hemoglobin 8.4, MCV 67.7, platelets 226,000, white count 4700 with an ANC of 2800.  Ferritin was 9 (low) with an saturation was 2% with a TIBC of 539 (high) c/w iron deficiency.  B12 was 263 (low normal).  Folate was 9. Guaiac cards 3 were negative.  Symptomatically, she is feeling better.  Ice pica has resolved.   Plan: 1.  Review labs from 01/16/2016.  Iron stores replete. 2.  Review additional labs.  No current evidence of B12 deficiency. Continue to monitor. 3.  RTC in 3 months for labs (CBC with diff, ferritin). 4.  RTC in 6 months for MD assess, labs (CBC with diff, ferritin-day before) and +/- Venofer.   Lequita Asal, MD  01/23/2016, 10:00 AM

## 2016-01-23 NOTE — Progress Notes (Signed)
Patient offers no complaints today. 

## 2016-01-24 ENCOUNTER — Other Ambulatory Visit: Payer: Self-pay | Admitting: *Deleted

## 2016-01-24 DIAGNOSIS — D508 Other iron deficiency anemias: Secondary | ICD-10-CM

## 2016-04-23 ENCOUNTER — Inpatient Hospital Stay: Payer: BLUE CROSS/BLUE SHIELD | Attending: Hematology and Oncology

## 2016-07-21 ENCOUNTER — Other Ambulatory Visit: Payer: BLUE CROSS/BLUE SHIELD

## 2016-07-23 ENCOUNTER — Ambulatory Visit: Payer: BLUE CROSS/BLUE SHIELD

## 2016-07-23 ENCOUNTER — Ambulatory Visit: Payer: BLUE CROSS/BLUE SHIELD | Admitting: Hematology and Oncology

## 2016-07-28 ENCOUNTER — Inpatient Hospital Stay: Payer: BLUE CROSS/BLUE SHIELD | Attending: Hematology and Oncology

## 2016-07-28 DIAGNOSIS — I10 Essential (primary) hypertension: Secondary | ICD-10-CM | POA: Diagnosis not present

## 2016-07-28 DIAGNOSIS — Z87891 Personal history of nicotine dependence: Secondary | ICD-10-CM | POA: Diagnosis not present

## 2016-07-28 DIAGNOSIS — Z79899 Other long term (current) drug therapy: Secondary | ICD-10-CM | POA: Insufficient documentation

## 2016-07-28 DIAGNOSIS — N920 Excessive and frequent menstruation with regular cycle: Secondary | ICD-10-CM | POA: Insufficient documentation

## 2016-07-28 DIAGNOSIS — D508 Other iron deficiency anemias: Secondary | ICD-10-CM

## 2016-07-28 DIAGNOSIS — F5089 Other specified eating disorder: Secondary | ICD-10-CM | POA: Insufficient documentation

## 2016-07-28 DIAGNOSIS — D509 Iron deficiency anemia, unspecified: Secondary | ICD-10-CM | POA: Insufficient documentation

## 2016-07-28 DIAGNOSIS — R5383 Other fatigue: Secondary | ICD-10-CM | POA: Diagnosis not present

## 2016-07-28 DIAGNOSIS — K2970 Gastritis, unspecified, without bleeding: Secondary | ICD-10-CM | POA: Insufficient documentation

## 2016-07-28 DIAGNOSIS — Z85828 Personal history of other malignant neoplasm of skin: Secondary | ICD-10-CM | POA: Diagnosis not present

## 2016-07-28 LAB — CBC WITH DIFFERENTIAL/PLATELET
Basophils Absolute: 0.1 10*3/uL (ref 0–0.1)
Basophils Relative: 1 %
Eosinophils Absolute: 0.5 10*3/uL (ref 0–0.7)
Eosinophils Relative: 6 %
HCT: 35.3 % (ref 35.0–47.0)
Hemoglobin: 11.2 g/dL — ABNORMAL LOW (ref 12.0–16.0)
Lymphocytes Relative: 22 %
Lymphs Abs: 1.6 10*3/uL (ref 1.0–3.6)
MCH: 26.1 pg (ref 26.0–34.0)
MCHC: 31.7 g/dL — ABNORMAL LOW (ref 32.0–36.0)
MCV: 82.2 fL (ref 80.0–100.0)
Monocytes Absolute: 0.5 10*3/uL (ref 0.2–0.9)
Monocytes Relative: 6 %
Neutro Abs: 4.8 10*3/uL (ref 1.4–6.5)
Neutrophils Relative %: 65 %
Platelets: 268 10*3/uL (ref 150–440)
RBC: 4.3 MIL/uL (ref 3.80–5.20)
RDW: 16 % — ABNORMAL HIGH (ref 11.5–14.5)
WBC: 7.5 10*3/uL (ref 3.6–11.0)

## 2016-07-28 LAB — FERRITIN: Ferritin: 10 ng/mL — ABNORMAL LOW (ref 11–307)

## 2016-07-30 ENCOUNTER — Inpatient Hospital Stay: Payer: BLUE CROSS/BLUE SHIELD

## 2016-07-30 ENCOUNTER — Inpatient Hospital Stay (HOSPITAL_BASED_OUTPATIENT_CLINIC_OR_DEPARTMENT_OTHER): Payer: BLUE CROSS/BLUE SHIELD | Admitting: Hematology and Oncology

## 2016-07-30 VITALS — BP 139/80 | HR 88 | Resp 18

## 2016-07-30 VITALS — BP 141/95 | HR 87 | Temp 97.8°F | Resp 18 | Wt 235.2 lb

## 2016-07-30 DIAGNOSIS — R5383 Other fatigue: Secondary | ICD-10-CM | POA: Diagnosis not present

## 2016-07-30 DIAGNOSIS — D508 Other iron deficiency anemias: Secondary | ICD-10-CM

## 2016-07-30 DIAGNOSIS — E538 Deficiency of other specified B group vitamins: Secondary | ICD-10-CM

## 2016-07-30 DIAGNOSIS — Z79899 Other long term (current) drug therapy: Secondary | ICD-10-CM | POA: Diagnosis not present

## 2016-07-30 DIAGNOSIS — F5089 Other specified eating disorder: Secondary | ICD-10-CM

## 2016-07-30 DIAGNOSIS — D509 Iron deficiency anemia, unspecified: Secondary | ICD-10-CM | POA: Diagnosis not present

## 2016-07-30 MED ORDER — SODIUM CHLORIDE 0.9 % IV SOLN
Freq: Once | INTRAVENOUS | Status: AC
  Administered 2016-07-30: 10:00:00 via INTRAVENOUS
  Filled 2016-07-30: qty 1000

## 2016-07-30 MED ORDER — SODIUM CHLORIDE 0.9 % IV SOLN: 200.0000 mg | Freq: Once | INTRAVENOUS | Status: DC

## 2016-07-30 MED ORDER — IRON SUCROSE 20 MG/ML IV SOLN
200.0000 mg | Freq: Once | INTRAVENOUS | Status: AC
  Administered 2016-07-30: 200 mg via INTRAVENOUS

## 2016-07-30 NOTE — Patient Instructions (Signed)
Iron Sucrose injection What is this medicine? IRON SUCROSE (AHY ern SOO krohs) is an iron complex. Iron is used to make healthy red blood cells, which carry oxygen and nutrients throughout the body. This medicine is used to treat iron deficiency anemia in people with chronic kidney disease. This medicine may be used for other purposes; ask your health care provider or pharmacist if you have questions. COMMON BRAND NAME(S): Venofer What should I tell my health care provider before I take this medicine? They need to know if you have any of these conditions: -anemia not caused by low iron levels -heart disease -high levels of iron in the blood -kidney disease -liver disease -an unusual or allergic reaction to iron, other medicines, foods, dyes, or preservatives -pregnant or trying to get pregnant -breast-feeding How should I use this medicine? This medicine is for infusion into a vein. It is given by a health care professional in a hospital or clinic setting. Talk to your pediatrician regarding the use of this medicine in children. While this drug may be prescribed for children as young as 2 years for selected conditions, precautions do apply. Overdosage: If you think you have taken too much of this medicine contact a poison control center or emergency room at once. NOTE: This medicine is only for you. Do not share this medicine with others. What if I miss a dose? It is important not to miss your dose. Call your doctor or health care professional if you are unable to keep an appointment. What may interact with this medicine? Do not take this medicine with any of the following medications: -deferoxamine -dimercaprol -other iron products This medicine may also interact with the following medications: -chloramphenicol -deferasirox This list may not describe all possible interactions. Give your health care provider a list of all the medicines, herbs, non-prescription drugs, or dietary  supplements you use. Also tell them if you smoke, drink alcohol, or use illegal drugs. Some items may interact with your medicine. What should I watch for while using this medicine? Visit your doctor or healthcare professional regularly. Tell your doctor or healthcare professional if your symptoms do not start to get better or if they get worse. You may need blood work done while you are taking this medicine. You may need to follow a special diet. Talk to your doctor. Foods that contain iron include: whole grains/cereals, dried fruits, beans, or peas, leafy green vegetables, and organ meats (liver, kidney). What side effects may I notice from receiving this medicine? Side effects that you should report to your doctor or health care professional as soon as possible: -allergic reactions like skin rash, itching or hives, swelling of the face, lips, or tongue -breathing problems -changes in blood pressure -cough -fast, irregular heartbeat -feeling faint or lightheaded, falls -fever or chills -flushing, sweating, or hot feelings -joint or muscle aches/pains -seizures -swelling of the ankles or feet -unusually weak or tired Side effects that usually do not require medical attention (report to your doctor or health care professional if they continue or are bothersome): -diarrhea -feeling achy -headache -irritation at site where injected -nausea, vomiting -stomach upset -tiredness This list may not describe all possible side effects. Call your doctor for medical advice about side effects. You may report side effects to FDA at 1-800-FDA-1088. Where should I keep my medicine? This drug is given in a hospital or clinic and will not be stored at home. NOTE: This sheet is a summary. It may not cover all possible information. If   you have questions about this medicine, talk to your doctor, pharmacist, or health care provider.  2018 Elsevier/Gold Standard (2011-01-16 17:14:35)  

## 2016-07-30 NOTE — Progress Notes (Signed)
[facility] day:  07/30/2016  Chief Complaint: Lisa Vasquez is a 46 y.o. female with iron deficiency who is seen for 6 month assessment.  HPI: The patient was last seen in the hematology clinic on 01/25/2016.  At that time, she was feeling better after receiving Venofer 800 mg over 4 weeks.  Ice pica had resolved.  Hematocrit was 35.5 with a hemoglobin of 10.9.  Ferritin was 139.  She began to feel fatigued in 04/2016.  Her ice pica has returned. She notes no change in her diet. She denies any vaginal bleeding. She denies any blood in her urine or stool.   Past Medical History:  Diagnosis Date  . Anemia   . Hypertension   . Skin cancer     Past Surgical History:  Procedure Laterality Date  . CERVICAL ABLATION    . EYE SURGERY    . NASAL SINUS SURGERY  2001  . STRABISMUS SURGERY Right   . WISDOM TOOTH EXTRACTION      Family History  Problem Relation Age of Onset  . COPD Mother   . Skin cancer Mother   . Heart defect Mother   . Heart attack Maternal Aunt   . Heart attack Maternal Uncle   . Breast cancer Maternal Grandmother   . Melanoma Maternal Uncle     Social History:  reports that she has quit smoking. She does not have any smokeless tobacco history on file. She reports that she drinks alcohol. Her drug history is not on file.  She has a brown belt in tae kwon do. She lives in [place].  The patient is alone today.  Allergies:  Allergies  Allergen Reactions  . Ondansetron Other (See Comments)    Constipation     Current Medications: Current Outpatient Prescriptions  Medication Sig Dispense Refill  . lisinopril (PRINIVIL,ZESTRIL) 10 MG tablet Take 10 mg by mouth daily.    . nortriptyline (PAMELOR) 75 MG capsule Take 90 mg by mouth at bedtime.    Marland Kitchen omeprazole (PRILOSEC) 40 MG capsule Take 40 mg by mouth daily.    . rizatriptan (MAXALT) 10 MG tablet Take 10 mg by mouth as needed for migraine. May repeat in 2 hours if  needed    . sertraline (ZOLOFT) 100 MG tablet Take 200 mg by mouth daily.    . simvastatin (ZOCOR) 40 MG tablet Take 40 mg by mouth daily.     No current facility-administered medications for this visit.     Review of Systems:  GENERAL:  Feels fatigued.  No fevers or sweats.  Weight up 6 pounds. PERFORMANCE STATUS (ECOG):  1 HEENT:  No visual changes, runny nose, sore throat, mouth sores or tenderness. Lungs: No shortness of breath or cough.  No hemoptysis. Cardiac:  No chest pain, palpitations, orthopnea, or PND. GI:  No nausea, vomiting, diarrhea, constipation, melena or hematochezia. GU:  No menses s/p uterine ablation.  No urgency, frequency, dysuria, or hematuria. Musculoskeletal:  No back pain.  Arthritis in neck.  No muscle tenderness. Extremities:  No pain or swelling. Skin:  No rashes or skin changes. Neuro:  No headache, numbness or weakness, balance or coordination issues. Endocrine:  No diabetes, thyroid issues.  Hot flashes- takes black cohash.  No night sweats. Psych:  No mood changes. Pain:  No focal pain. Review of systems:  All other systems reviewed and found to be negative.  Physical Exam: Blood pressure (!) 150/103, pulse 96, temperature 97.8  F (36.6 C), temperature source Tympanic, resp. rate 18, weight 235 lb 3.7 oz (106.7 kg). GENERAL:  Well developed, well nourished, woman sitting comfortably in the exam room in no acute distress. MENTAL STATUS:  Alert and oriented to person, place and time. HEAD:  Shoulder length hair.  Normocephalic, atraumatic, face symmetric, no Cushingoid features. EYES:  Glasses.  Pupils equal round and reactive to light and accomodation.  No conjunctivitis or scleral icterus. ENT:  Oropharynx clear without lesion.  Tongue normal. Mucous membranes moist.  RESPIRATORY:  Clear to auscultation without rales, wheezes or rhonchi. CARDIOVASCULAR:  Regular rate and rhythm without murmur, rub or gallop. ABDOMEN:  Soft, non-tender, with active  bowel sounds, and no hepatosplenomegaly.  No masses. SKIN:  No rashes, ulcers or lesions. EXTREMITIES: No edema, no skin discoloration or tenderness.  No palpable cords. LYMPH NODES: No palpable cervical, supraclavicular, axillary or inguinal adenopathy  NEUROLOGICAL: Unremarkable. PSYCH:  Appropriate.   Appointment on 07/28/2016  Component Date Value Ref Range Status  . WBC 07/28/2016 7.5  3.6 - 11.0 K/uL Final  . RBC 07/28/2016 4.30  3.80 - 5.20 MIL/uL Final  . Hemoglobin 07/28/2016 11.2* 12.0 - 16.0 g/dL Final  . HCT 07/28/2016 35.3  35.0 - 47.0 % Final  . MCV 07/28/2016 82.2  80.0 - 100.0 fL Final  . MCH 07/28/2016 26.1  26.0 - 34.0 pg Final  . MCHC 07/28/2016 31.7* 32.0 - 36.0 g/dL Final  . RDW 07/28/2016 16.0* 11.5 - 14.5 % Final  . Platelets 07/28/2016 268  150 - 440 K/uL Final  . Neutrophils Relative % 07/28/2016 65  % Final  . Neutro Abs 07/28/2016 4.8  1.4 - 6.5 K/uL Final  . Lymphocytes Relative 07/28/2016 22  % Final  . Lymphs Abs 07/28/2016 1.6  1.0 - 3.6 K/uL Final  . Monocytes Relative 07/28/2016 6  % Final  . Monocytes Absolute 07/28/2016 0.5  0.2 - 0.9 K/uL Final  . Eosinophils Relative 07/28/2016 6  % Final  . Eosinophils Absolute 07/28/2016 0.5  0 - 0.7 K/uL Final  . Basophils Relative 07/28/2016 1  % Final  . Basophils Absolute 07/28/2016 0.1  0 - 0.1 K/uL Final  . Ferritin 07/28/2016 10* 11 - 307 ng/mL Final    Assessment:  Lisa Vasquez is a 46 y.o. female with iron deficiency anemia. EGD and colonoscopy 3-4 years ago revealed gastritis only. She has been on omeprazole since that time.  She has a history of menorrhagia and status post uterine uterine ablation in 2004.   She has had no menses since that time.  Diet is modest.  She is intolerant of oral iron.  She has had ice pica since 04/2015.  She [facility] in 07/2012.  She received Venofer 800 mg (12/26/2015 - 01/16/2016).  She tolerated her infusions well.  Workup on 12/19/2015  revealed a hematocrit of 27.8, hemoglobin 8.4, MCV 67.7, platelets 226,000, white count 4700 with an ANC of 2800.  Ferritin was 9 (low) with an saturation was 2% with a TIBC of 539 (high) c/w iron deficiency.  B12 was 263 (low normal).  MMA was 77 (normal) on 12/26/2015 r/o B12 deficiency.  Folate was 9. Guaiac cards 3 were negative.  Ferritin has been followed:  9 on 12/19/2015, 139 on 01/16/2016, and 10 on 07/28/2016.  Symptomatically, she is is feeling fatigued.  Ice pica has returned.  Exam is stable.  Plan: 1.  Review labs from 07/28/2016.  Discuss recurrent iron deficiency.  Etiology unclear.  Patient not having menses.  No apparent GI or GU bleeding.  Discuss reinstitution of Venofer. 2.  Venofer today 3.  RTC weekly x 2 for Venofer 4.  Urinalysis- r/o microscopic hematuria. 5.  Guaiac cards x 3 6.  RTC in 3 months for labs (CBC with diff, ferritin, B12). 7.  RTC in 6 months for MD assessment, labs (CBC with diff, ferritin-day before) and +/- Venofer.  Addendum:  Urinalysis reveals no hematuria.   Lequita Asal, MD  07/30/2016, 9:37 AM

## 2016-07-30 NOTE — Progress Notes (Signed)
Patient states she started getting fatigued again.  In late February she noticed the ice pica again.

## 2016-08-06 ENCOUNTER — Inpatient Hospital Stay: Payer: BLUE CROSS/BLUE SHIELD

## 2016-08-06 VITALS — BP 132/90 | HR 80 | Temp 97.1°F | Resp 18

## 2016-08-06 DIAGNOSIS — R7989 Other specified abnormal findings of blood chemistry: Secondary | ICD-10-CM

## 2016-08-06 DIAGNOSIS — E538 Deficiency of other specified B group vitamins: Secondary | ICD-10-CM

## 2016-08-06 DIAGNOSIS — D508 Other iron deficiency anemias: Secondary | ICD-10-CM

## 2016-08-06 DIAGNOSIS — D509 Iron deficiency anemia, unspecified: Secondary | ICD-10-CM | POA: Diagnosis not present

## 2016-08-06 LAB — URINALYSIS, COMPLETE (UACMP) WITH MICROSCOPIC
Bilirubin Urine: NEGATIVE
Glucose, UA: NEGATIVE mg/dL
Hgb urine dipstick: NEGATIVE
Ketones, ur: NEGATIVE mg/dL
Leukocytes, UA: NEGATIVE
Nitrite: NEGATIVE
Protein, ur: NEGATIVE mg/dL
RBC / HPF: NONE SEEN RBC/hpf (ref 0–5)
Specific Gravity, Urine: 1.025 (ref 1.005–1.030)
pH: 5.5 (ref 5.0–8.0)

## 2016-08-06 MED ORDER — SODIUM CHLORIDE 0.9 % IV SOLN
Freq: Once | INTRAVENOUS | Status: AC
  Administered 2016-08-06: 10:00:00 via INTRAVENOUS
  Filled 2016-08-06: qty 1000

## 2016-08-06 MED ORDER — SODIUM CHLORIDE 0.9 % IV SOLN: 200.0000 mg | Freq: Once | INTRAVENOUS | Status: DC

## 2016-08-06 MED ORDER — IRON SUCROSE 20 MG/ML IV SOLN
200.0000 mg | Freq: Once | INTRAVENOUS | Status: AC
  Administered 2016-08-06: 200 mg via INTRAVENOUS

## 2016-08-06 NOTE — Patient Instructions (Signed)
Iron Sucrose injection What is this medicine? IRON SUCROSE (AHY ern SOO krohs) is an iron complex. Iron is used to make healthy red blood cells, which carry oxygen and nutrients throughout the body. This medicine is used to treat iron deficiency anemia in people with chronic kidney disease. This medicine may be used for other purposes; ask your health care provider or pharmacist if you have questions. COMMON BRAND NAME(S): Venofer What should I tell my health care provider before I take this medicine? They need to know if you have any of these conditions: -anemia not caused by low iron levels -heart disease -high levels of iron in the blood -kidney disease -liver disease -an unusual or allergic reaction to iron, other medicines, foods, dyes, or preservatives -pregnant or trying to get pregnant -breast-feeding How should I use this medicine? This medicine is for infusion into a vein. It is given by a health care professional in a hospital or clinic setting. Talk to your pediatrician regarding the use of this medicine in children. While this drug may be prescribed for children as young as 2 years for selected conditions, precautions do apply. Overdosage: If you think you have taken too much of this medicine contact a poison control center or emergency room at once. NOTE: This medicine is only for you. Do not share this medicine with others. What if I miss a dose? It is important not to miss your dose. Call your doctor or health care professional if you are unable to keep an appointment. What may interact with this medicine? Do not take this medicine with any of the following medications: -deferoxamine -dimercaprol -other iron products This medicine may also interact with the following medications: -chloramphenicol -deferasirox This list may not describe all possible interactions. Give your health care provider a list of all the medicines, herbs, non-prescription drugs, or dietary  supplements you use. Also tell them if you smoke, drink alcohol, or use illegal drugs. Some items may interact with your medicine. What should I watch for while using this medicine? Visit your doctor or healthcare professional regularly. Tell your doctor or healthcare professional if your symptoms do not start to get better or if they get worse. You may need blood work done while you are taking this medicine. You may need to follow a special diet. Talk to your doctor. Foods that contain iron include: whole grains/cereals, dried fruits, beans, or peas, leafy green vegetables, and organ meats (liver, kidney). What side effects may I notice from receiving this medicine? Side effects that you should report to your doctor or health care professional as soon as possible: -allergic reactions like skin rash, itching or hives, swelling of the face, lips, or tongue -breathing problems -changes in blood pressure -cough -fast, irregular heartbeat -feeling faint or lightheaded, falls -fever or chills -flushing, sweating, or hot feelings -joint or muscle aches/pains -seizures -swelling of the ankles or feet -unusually weak or tired Side effects that usually do not require medical attention (report to your doctor or health care professional if they continue or are bothersome): -diarrhea -feeling achy -headache -irritation at site where injected -nausea, vomiting -stomach upset -tiredness This list may not describe all possible side effects. Call your doctor for medical advice about side effects. You may report side effects to FDA at 1-800-FDA-1088. Where should I keep my medicine? This drug is given in a hospital or clinic and will not be stored at home. NOTE: This sheet is a summary. It may not cover all possible information. If   you have questions about this medicine, talk to your doctor, pharmacist, or health care provider.  2018 Elsevier/Gold Standard (2011-01-16 17:14:35)  

## 2016-08-13 ENCOUNTER — Inpatient Hospital Stay: Payer: BLUE CROSS/BLUE SHIELD

## 2016-08-13 VITALS — BP 112/79 | HR 90 | Temp 97.9°F | Resp 20

## 2016-08-13 DIAGNOSIS — D509 Iron deficiency anemia, unspecified: Secondary | ICD-10-CM | POA: Diagnosis not present

## 2016-08-13 DIAGNOSIS — D508 Other iron deficiency anemias: Secondary | ICD-10-CM

## 2016-08-13 MED ORDER — IRON SUCROSE 20 MG/ML IV SOLN
200.0000 mg | Freq: Once | INTRAVENOUS | Status: AC
  Administered 2016-08-13: 200 mg via INTRAVENOUS
  Filled 2016-08-13: qty 10

## 2016-08-13 MED ORDER — SODIUM CHLORIDE 0.9 % IV SOLN
Freq: Once | INTRAVENOUS | Status: AC
  Administered 2016-08-13: 09:00:00 via INTRAVENOUS
  Filled 2016-08-13: qty 1000

## 2016-08-13 MED ORDER — SODIUM CHLORIDE 0.9 % IV SOLN: 200.0000 mg | Freq: Once | INTRAVENOUS | Status: DC

## 2016-08-13 NOTE — Patient Instructions (Signed)

## 2016-08-27 ENCOUNTER — Encounter: Payer: Self-pay | Admitting: Hematology and Oncology

## 2016-09-05 ENCOUNTER — Inpatient Hospital Stay: Payer: BLUE CROSS/BLUE SHIELD | Attending: Hematology and Oncology

## 2016-09-05 DIAGNOSIS — D509 Iron deficiency anemia, unspecified: Secondary | ICD-10-CM | POA: Insufficient documentation

## 2016-09-05 DIAGNOSIS — E538 Deficiency of other specified B group vitamins: Secondary | ICD-10-CM

## 2016-09-05 DIAGNOSIS — F5089 Other specified eating disorder: Secondary | ICD-10-CM | POA: Insufficient documentation

## 2016-09-05 DIAGNOSIS — R5383 Other fatigue: Secondary | ICD-10-CM | POA: Diagnosis not present

## 2016-09-05 DIAGNOSIS — Z79899 Other long term (current) drug therapy: Secondary | ICD-10-CM | POA: Diagnosis not present

## 2016-09-05 DIAGNOSIS — I10 Essential (primary) hypertension: Secondary | ICD-10-CM | POA: Insufficient documentation

## 2016-09-05 DIAGNOSIS — Z87891 Personal history of nicotine dependence: Secondary | ICD-10-CM | POA: Insufficient documentation

## 2016-09-05 DIAGNOSIS — K2970 Gastritis, unspecified, without bleeding: Secondary | ICD-10-CM | POA: Diagnosis not present

## 2016-09-05 DIAGNOSIS — D508 Other iron deficiency anemias: Secondary | ICD-10-CM

## 2016-09-05 DIAGNOSIS — Z85828 Personal history of other malignant neoplasm of skin: Secondary | ICD-10-CM | POA: Diagnosis not present

## 2016-09-05 DIAGNOSIS — N920 Excessive and frequent menstruation with regular cycle: Secondary | ICD-10-CM | POA: Insufficient documentation

## 2016-09-05 LAB — OCCULT BLOOD X 1 CARD TO LAB, STOOL
Fecal Occult Bld: POSITIVE — AB
Fecal Occult Bld: POSITIVE — AB

## 2016-09-09 ENCOUNTER — Telehealth: Payer: Self-pay | Admitting: *Deleted

## 2016-09-09 NOTE — Telephone Encounter (Signed)
-----   Message from Lequita Asal, MD sent at 09/09/2016  1:38 PM EDT ----- Regarding: Please call patient  All guaiac cards +.  Any melena or hematochezia?  Does she have a gastroenterologist?  M  ----- Message ----- From: Interface, Lab In [place] Sent: 09/05/2016  12:12 PM To: Lequita Asal, MD

## 2016-09-09 NOTE — Telephone Encounter (Signed)
Called patient to inform her that her guiac stools are positive.  She does have melena but no hematochezia.  She does not have a gastroenterologist.

## 2016-10-29 ENCOUNTER — Inpatient Hospital Stay: Payer: BLUE CROSS/BLUE SHIELD

## 2016-10-30 ENCOUNTER — Telehealth: Payer: Self-pay | Admitting: *Deleted

## 2016-10-30 ENCOUNTER — Inpatient Hospital Stay: Payer: BLUE CROSS/BLUE SHIELD | Attending: Hematology and Oncology

## 2016-10-30 DIAGNOSIS — Z79899 Other long term (current) drug therapy: Secondary | ICD-10-CM | POA: Diagnosis not present

## 2016-10-30 DIAGNOSIS — D508 Other iron deficiency anemias: Secondary | ICD-10-CM

## 2016-10-30 DIAGNOSIS — E538 Deficiency of other specified B group vitamins: Secondary | ICD-10-CM

## 2016-10-30 DIAGNOSIS — D500 Iron deficiency anemia secondary to blood loss (chronic): Secondary | ICD-10-CM | POA: Diagnosis not present

## 2016-10-30 LAB — CBC WITH DIFFERENTIAL/PLATELET
Basophils Absolute: 0.1 10*3/uL (ref 0–0.1)
Basophils Relative: 2 %
Eosinophils Absolute: 0.3 10*3/uL (ref 0–0.7)
Eosinophils Relative: 6 %
HCT: 38.3 % (ref 35.0–47.0)
Hemoglobin: 12.4 g/dL (ref 12.0–16.0)
Lymphocytes Relative: 22 %
Lymphs Abs: 1.2 10*3/uL (ref 1.0–3.6)
MCH: 27.7 pg (ref 26.0–34.0)
MCHC: 32.5 g/dL (ref 32.0–36.0)
MCV: 85.2 fL (ref 80.0–100.0)
Monocytes Absolute: 0.5 10*3/uL (ref 0.2–0.9)
Monocytes Relative: 8 %
Neutro Abs: 3.5 10*3/uL (ref 1.4–6.5)
Neutrophils Relative %: 62 %
Platelets: 242 10*3/uL (ref 150–440)
RBC: 4.49 MIL/uL (ref 3.80–5.20)
RDW: 16.2 % — ABNORMAL HIGH (ref 11.5–14.5)
WBC: 5.6 10*3/uL (ref 3.6–11.0)

## 2016-10-30 LAB — FERRITIN: Ferritin: 15 ng/mL (ref 11–307)

## 2016-10-30 LAB — VITAMIN B12: Vitamin B-12: 335 pg/mL (ref 180–914)

## 2016-10-30 NOTE — Telephone Encounter (Signed)
-----   Message from Lequita Asal, MD sent at 10/30/2016  2:07 PM EDT ----- Regarding: Please call patient  Ferritin is low.  Hematocrit is normal.  Any symptoms?  Consider Venofer if symptomatic.  M  ----- Message ----- From: Interface, Lab In [place] Sent: 10/30/2016   9:27 AM To: Lequita Asal, MD

## 2016-10-30 NOTE — Telephone Encounter (Signed)
Called patient to inquire if she is having any symptoms?  Her ferritin is low.  Also asked if she would consider Venofer if she is symptomatic.  She states she is fatigued and short of breath.  She also asked what is going to be done about her stool cards being positive?

## 2016-10-31 ENCOUNTER — Other Ambulatory Visit: Payer: Self-pay | Admitting: *Deleted

## 2016-10-31 DIAGNOSIS — D508 Other iron deficiency anemias: Secondary | ICD-10-CM

## 2016-10-31 NOTE — Telephone Encounter (Signed)
  She needs referral to GI.  Venofer weekly x 2.  M

## 2016-11-04 ENCOUNTER — Inpatient Hospital Stay: Payer: BLUE CROSS/BLUE SHIELD

## 2016-11-04 VITALS — BP 130/84 | HR 85 | Temp 97.0°F | Resp 20

## 2016-11-04 DIAGNOSIS — D500 Iron deficiency anemia secondary to blood loss (chronic): Secondary | ICD-10-CM

## 2016-11-04 MED ORDER — SODIUM CHLORIDE 0.9 % IV SOLN
Freq: Once | INTRAVENOUS | Status: AC
  Administered 2016-11-04: 09:00:00 via INTRAVENOUS
  Filled 2016-11-04: qty 1000

## 2016-11-04 MED ORDER — IRON SUCROSE 20 MG/ML IV SOLN
200.0000 mg | Freq: Once | INTRAVENOUS | Status: AC
  Administered 2016-11-04: 200 mg via INTRAVENOUS

## 2016-11-04 MED ORDER — SODIUM CHLORIDE 0.9 % IV SOLN: 200.0000 mg | Freq: Once | INTRAVENOUS | Status: DC

## 2016-11-04 NOTE — Patient Instructions (Signed)
Iron Sucrose injection What is this medicine? IRON SUCROSE (AHY ern SOO krohs) is an iron complex. Iron is used to make healthy red blood cells, which carry oxygen and nutrients throughout the body. This medicine is used to treat iron deficiency anemia in people with chronic kidney disease. This medicine may be used for other purposes; ask your health care provider or pharmacist if you have questions. COMMON BRAND NAME(S): Venofer What should I tell my health care provider before I take this medicine? They need to know if you have any of these conditions: -anemia not caused by low iron levels -heart disease -high levels of iron in the blood -kidney disease -liver disease -an unusual or allergic reaction to iron, other medicines, foods, dyes, or preservatives -pregnant or trying to get pregnant -breast-feeding How should I use this medicine? This medicine is for infusion into a vein. It is given by a health care professional in a hospital or clinic setting. Talk to your pediatrician regarding the use of this medicine in children. While this drug may be prescribed for children as young as 2 years for selected conditions, precautions do apply. Overdosage: If you think you have taken too much of this medicine contact a poison control center or emergency room at once. NOTE: This medicine is only for you. Do not share this medicine with others. What if I miss a dose? It is important not to miss your dose. Call your doctor or health care professional if you are unable to keep an appointment. What may interact with this medicine? Do not take this medicine with any of the following medications: -deferoxamine -dimercaprol -other iron products This medicine may also interact with the following medications: -chloramphenicol -deferasirox This list may not describe all possible interactions. Give your health care provider a list of all the medicines, herbs, non-prescription drugs, or dietary  supplements you use. Also tell them if you smoke, drink alcohol, or use illegal drugs. Some items may interact with your medicine. What should I watch for while using this medicine? Visit your doctor or healthcare professional regularly. Tell your doctor or healthcare professional if your symptoms do not start to get better or if they get worse. You may need blood work done while you are taking this medicine. You may need to follow a special diet. Talk to your doctor. Foods that contain iron include: whole grains/cereals, dried fruits, beans, or peas, leafy green vegetables, and organ meats (liver, kidney). What side effects may I notice from receiving this medicine? Side effects that you should report to your doctor or health care professional as soon as possible: -allergic reactions like skin rash, itching or hives, swelling of the face, lips, or tongue -breathing problems -changes in blood pressure -cough -fast, irregular heartbeat -feeling faint or lightheaded, falls -fever or chills -flushing, sweating, or hot feelings -joint or muscle aches/pains -seizures -swelling of the ankles or feet -unusually weak or tired Side effects that usually do not require medical attention (report to your doctor or health care professional if they continue or are bothersome): -diarrhea -feeling achy -headache -irritation at site where injected -nausea, vomiting -stomach upset -tiredness This list may not describe all possible side effects. Call your doctor for medical advice about side effects. You may report side effects to FDA at 1-800-FDA-1088. Where should I keep my medicine? This drug is given in a hospital or clinic and will not be stored at home. NOTE: This sheet is a summary. It may not cover all possible information. If   you have questions about this medicine, talk to your doctor, pharmacist, or health care provider.  2018 Elsevier/Gold Standard (2011-01-16 17:14:35)  

## 2016-11-12 ENCOUNTER — Inpatient Hospital Stay: Payer: BLUE CROSS/BLUE SHIELD

## 2016-11-12 ENCOUNTER — Telehealth: Payer: Self-pay | Admitting: *Deleted

## 2016-11-12 NOTE — Telephone Encounter (Signed)
Called patient regarding her phone call to Shirlean Mylar that she received venofer on 11-04-16 and after taking a shower, vomited and passed out.  She wanted to know if this could be caused by Venofer.  Asked patient how soon after receiving Venofer did she throw up and pass out.  She states this all happened this past Monday, 6 days post treatment.  She states she threw up what looked like bile.   She also confirmed that she has had Venofer in the past without complications.  I advised her per MD that she does not feel this is related to her treatment.  Advised her to contact PCP.  She is in agreement and will do so.Marland Kitchen

## 2016-11-17 ENCOUNTER — Inpatient Hospital Stay: Payer: BLUE CROSS/BLUE SHIELD

## 2016-11-17 ENCOUNTER — Other Ambulatory Visit: Payer: Self-pay | Admitting: Hematology and Oncology

## 2016-11-17 DIAGNOSIS — D508 Other iron deficiency anemias: Secondary | ICD-10-CM

## 2016-11-17 DIAGNOSIS — D500 Iron deficiency anemia secondary to blood loss (chronic): Secondary | ICD-10-CM | POA: Diagnosis not present

## 2016-11-17 MED ORDER — SODIUM CHLORIDE 0.9 % IV SOLN
Freq: Once | INTRAVENOUS | Status: AC
  Administered 2016-11-17: 10:00:00 via INTRAVENOUS
  Filled 2016-11-17: qty 1000

## 2016-11-17 MED ORDER — IRON SUCROSE 20 MG/ML IV SOLN
200.0000 mg | Freq: Once | INTRAVENOUS | Status: AC
  Administered 2016-11-17: 200 mg via INTRAVENOUS
  Filled 2016-11-17: qty 10

## 2016-12-09 ENCOUNTER — Other Ambulatory Visit
Admission: RE | Admit: 2016-12-09 | Discharge: 2016-12-09 | Disposition: A | Discharge status: Home / Self Care | Payer: BLUE CROSS/BLUE SHIELD | Source: Ambulatory Visit | Attending: Gastroenterology | Admitting: Gastroenterology

## 2016-12-09 ENCOUNTER — Other Ambulatory Visit: Payer: Self-pay

## 2016-12-09 ENCOUNTER — Ambulatory Visit (INDEPENDENT_AMBULATORY_CARE_PROVIDER_SITE_OTHER): Payer: BLUE CROSS/BLUE SHIELD | Admitting: Gastroenterology

## 2016-12-09 ENCOUNTER — Encounter: Payer: Self-pay | Admitting: Gastroenterology

## 2016-12-09 VITALS — BP 157/93 | HR 102 | Temp 97.5°F | Ht 65.5 in | Wt 226.0 lb

## 2016-12-09 DIAGNOSIS — D500 Iron deficiency anemia secondary to blood loss (chronic): Secondary | ICD-10-CM | POA: Diagnosis not present

## 2016-12-09 LAB — FERRITIN: Ferritin: 87 ng/mL (ref 11–307)

## 2016-12-09 LAB — IRON AND TIBC
Iron: 53 ug/dL (ref 28–170)
Saturation Ratios: 11 % (ref 10.4–31.8)
TIBC: 482 ug/dL — ABNORMAL HIGH (ref 250–450)
UIBC: 429 ug/dL

## 2016-12-09 LAB — CBC
HCT: 43 % (ref 35.0–47.0)
Hemoglobin: 14.4 g/dL (ref 12.0–16.0)
MCH: 29.2 pg (ref 26.0–34.0)
MCHC: 33.5 g/dL (ref 32.0–36.0)
MCV: 86.9 fL (ref 80.0–100.0)
Platelets: 307 10*3/uL (ref 150–440)
RBC: 4.95 MIL/uL (ref 3.80–5.20)
RDW: 17 % — ABNORMAL HIGH (ref 11.5–14.5)
WBC: 9.2 10*3/uL (ref 3.6–11.0)

## 2016-12-09 MED ORDER — OMEPRAZOLE 40 MG PO CPDR: 40.0000 mg | DELAYED_RELEASE_CAPSULE | Freq: Two times a day (BID) | ORAL | 2 refills | Status: DC

## 2016-12-09 NOTE — Progress Notes (Signed)
Cephas Darby, MD [address]  [place]  [place], [place] [address]  Main: [phone]  Fax: [phone]    Gastroenterology Consultation  Referring Provider:     Ronnell Freshwater, PA-C Primary Care Physician:  Ronnell Freshwater, PA-C Primary Gastroenterologist:  Dr. Cephas Darby Reason for Consultation:     Iron deficiency anemia        HPI:   Lisa Vasquez is a 46 y.o. y/o female referred by Dr. Leafy Kindle, Wilburt Finlay, PA-C  for consultation & management  of IDA. She has been diagnosed with iron deficiency anemia in 11/2015 based on CBC and low ferritin levels. She closely follows Dr. Mike Gip since September 2017 and has received Venofer infusions since then. However, she has been persistently anemic with low ferritin levels.  Intermittent dark, tarry stools past 2years, currently brown BMs. She thinks that her hemoglobin might be low as she feels tired and ice pica has returned. Takes BC arthritis strength powder for chronic pain of neck, shoulders, has been taking since she was a teenager, 2 times a week, 1 packet/dose. She has not seen a pain specialist or orthopedic doctor. She had history of cervical ablation in 2014 Prilosec 40mg  daily for heart burn Hb 12 in 10/2016 She denies any other upper GI, lower GI symptoms, weight loss, loss of appetite  GI Procedures: EGD 2014 at [facility] showed erosions, hiatal hernia Colonoscopy 2014 at [facility], normal per patient  Wine daily, 1-2 glasses before bed No smoking No drug abuse  Fam h/o GI malignancy: none  Past Medical History:  Diagnosis Date  . Anemia   . Hypertension   . Skin cancer     Past Surgical History:  Procedure Laterality Date  . CERVICAL ABLATION    . EYE SURGERY    . NASAL SINUS SURGERY  2001  . STRABISMUS SURGERY Right   . WISDOM TOOTH EXTRACTION      Prior to Admission medications   Medication Sig Start Date End Date Taking? Authorizing Provider  fluticasone (FLONASE) 50 MCG/ACT  nasal spray PLACE 1 SPRAY IN EACH NOSTRIL D 09/01/16  Yes [provider]  lisinopril-hydrochlorothiazide (PRINZIDE,ZESTORETIC) 20-12.5 MG tablet TK 1 T PO  QD 11/11/16  Yes [provider]  nortriptyline (PAMELOR) 10 MG capsule TK 3 CS PO QD AT DINNER 09/19/16  Yes [provider]  omeprazole (PRILOSEC) 40 MG capsule Take 40 mg by mouth daily.   Yes [provider]  rizatriptan (MAXALT) 10 MG tablet Take 10 mg by mouth as needed for migraine. May repeat in 2 hours if needed   Yes [provider]  sertraline (ZOLOFT) 100 MG tablet Take 200 mg by mouth daily.   Yes [provider]  simvastatin (ZOCOR) 40 MG tablet Take 40 mg by mouth daily.   Yes [provider]  lisinopril (PRINIVIL,ZESTRIL) 10 MG tablet Take 10 mg by mouth daily.    [provider]  nortriptyline (PAMELOR) 75 MG capsule Take 90 mg by mouth at bedtime.    [provider]    Family History  Problem Relation Age of Onset  . COPD Mother   . Skin cancer Mother   . Heart defect Mother   . Heart attack Maternal Aunt   . Heart attack Maternal Uncle   . Breast cancer Maternal Grandmother   . Melanoma Maternal Uncle      Social History  Substance Use Topics  . Smoking status: Former Research scientist (life sciences)  . Smokeless  tobacco: Never Used  . Alcohol use Yes    Allergies as of 12/09/2016 - Review Complete 12/09/2016  Allergen Reaction Noted  . Ondansetron Other (See Comments) 12/02/2012    Review of Systems:    All systems reviewed and negative except where noted in HPI.   Physical Exam:  BP (!) 157/93   Pulse (!) 102   Temp (!) 97.5 F (36.4 C) (Oral)   Ht 5' 5.5" (1.664 m)   Wt 102.5 kg (226 lb)   BMI 37.04 kg/m  No LMP recorded. Patient has had an ablation.  General:   Alert,  Well-developed, well-nourished, pleasant and cooperative in NAD Head:  Normocephalic and atraumatic. Eyes:  Sclera clear, no icterus.   Conjunctiva pink. Ears:  Normal  auditory acuity. Nose:  No deformity, discharge, or lesions. Mouth:  No deformity or lesions,oropharynx pink & moist. Neck:  Supple; no masses or thyromegaly. Lungs:  Respirations even and unlabored.  Clear throughout to auscultation.   No wheezes, crackles, or rhonchi. No acute distress. Heart:  Regular rate and rhythm; no murmurs, clicks, rubs, or gallops. Abdomen:  Normal bowel sounds.  No bruits.  Soft, non-tender and non-distended without masses, hepatosplenomegaly or hernias noted.  No guarding or rebound tenderness.   Rectal: Nor performed Msk:  Symmetrical without gross deformities. Good, equal movement & strength bilaterally. Pulses:  Normal pulses noted. Extremities:  No clubbing or edema.  No cyanosis. Neurologic:  Alert and oriented x3;  grossly normal neurologically. Skin:  Intact without significant lesions or rashes. No jaundice. Lymph Nodes:  No significant cervical adenopathy. Psych:  Alert and cooperative. Normal mood and affect.  Imaging Studies: None  Assessment and Plan:   Lisa Vasquez is a 46 y.o. y/o female with Chronic pain on chronic BC powder, iron deficiency anemia since September 2017, on Venofer on a regular basis. Has persistent iron deficiency. Hemoglobin normal in 10/2016. She has fatigue, ice pica. Also, has chronic heartburn  1.Increase prilosec to 40mg  BID  2.Stop BC powder, avoid NSAIDs, discuss about chronic pain issues with either PCP or a pain specialist. Advised her to see orthopedics for chronic neck pain 3.Blood work today 4. Based on the blood work today and if she has persistent iron deficiency anemia despite stopping BC powder, I will proceed with upper endoscopy and a colonoscopy. If these are unremarkable, I will proceed with VCE.  Follow up in 3 months   Cephas Darby, MD

## 2016-12-09 NOTE — Patient Instructions (Addendum)
1.Increase prilosec to 40mg  BID  2.Stop BC powder 3.Blood work today  Please call our office to speak with my nurse Driscilla Grammes at [phone] during business hours from 8am to 4pm if you have any questions/concerns. During after hours, you will be redirected to on call GI physician. For any emergency please call 911 or go the nearest emergency room.    Cephas Darby, MD [address]  [place]  [place], [place] [address]  Main: [phone]  Fax: [phone]

## 2016-12-10 ENCOUNTER — Encounter: Payer: Self-pay | Admitting: Gastroenterology

## 2016-12-11 ENCOUNTER — Other Ambulatory Visit: Payer: Self-pay

## 2017-01-27 ENCOUNTER — Inpatient Hospital Stay: Payer: BLUE CROSS/BLUE SHIELD | Attending: Hematology and Oncology

## 2017-01-27 ENCOUNTER — Other Ambulatory Visit: Payer: Self-pay

## 2017-01-27 DIAGNOSIS — R5383 Other fatigue: Secondary | ICD-10-CM | POA: Insufficient documentation

## 2017-01-27 DIAGNOSIS — Z79899 Other long term (current) drug therapy: Secondary | ICD-10-CM | POA: Diagnosis not present

## 2017-01-27 DIAGNOSIS — Z87891 Personal history of nicotine dependence: Secondary | ICD-10-CM | POA: Insufficient documentation

## 2017-01-27 DIAGNOSIS — E538 Deficiency of other specified B group vitamins: Secondary | ICD-10-CM

## 2017-01-27 DIAGNOSIS — I10 Essential (primary) hypertension: Secondary | ICD-10-CM | POA: Insufficient documentation

## 2017-01-27 DIAGNOSIS — N920 Excessive and frequent menstruation with regular cycle: Secondary | ICD-10-CM | POA: Diagnosis not present

## 2017-01-27 DIAGNOSIS — D508 Other iron deficiency anemias: Secondary | ICD-10-CM

## 2017-01-27 DIAGNOSIS — K2970 Gastritis, unspecified, without bleeding: Secondary | ICD-10-CM | POA: Diagnosis not present

## 2017-01-27 DIAGNOSIS — Z85828 Personal history of other malignant neoplasm of skin: Secondary | ICD-10-CM | POA: Insufficient documentation

## 2017-01-27 DIAGNOSIS — D500 Iron deficiency anemia secondary to blood loss (chronic): Secondary | ICD-10-CM | POA: Insufficient documentation

## 2017-01-27 LAB — CBC WITH DIFFERENTIAL/PLATELET
Basophils Absolute: 0.1 10*3/uL (ref 0–0.1)
Basophils Relative: 2 %
Eosinophils Absolute: 0.5 10*3/uL (ref 0–0.7)
Eosinophils Relative: 8 %
HCT: 41.8 % (ref 35.0–47.0)
Hemoglobin: 13.9 g/dL (ref 12.0–16.0)
Lymphocytes Relative: 21 %
Lymphs Abs: 1.4 10*3/uL (ref 1.0–3.6)
MCH: 29.3 pg (ref 26.0–34.0)
MCHC: 33.2 g/dL (ref 32.0–36.0)
MCV: 88.3 fL (ref 80.0–100.0)
Monocytes Absolute: 0.5 10*3/uL (ref 0.2–0.9)
Monocytes Relative: 8 %
Neutro Abs: 4 10*3/uL (ref 1.4–6.5)
Neutrophils Relative %: 61 %
Platelets: 246 10*3/uL (ref 150–440)
RBC: 4.73 MIL/uL (ref 3.80–5.20)
RDW: 16.7 % — ABNORMAL HIGH (ref 11.5–14.5)
WBC: 6.4 10*3/uL (ref 3.6–11.0)

## 2017-01-27 LAB — FERRITIN: Ferritin: 37 ng/mL (ref 11–307)

## 2017-01-27 NOTE — Progress Notes (Signed)
[facility] day:  01/28/2017  Chief Complaint: Lisa Vasquez is a 46 y.o. female with iron deficiency who is seen for 6 month assessment.  HPI: The patient was last seen in the hematology clinic on 07/30/2016.  At that time, patient was doing the feel fatigued again. Her ice pica and returned. Patient was maintaining an iron rich diet. She denied bleeding. Hemoglobin 11.2, hematocrit 35.3, MCV 82.2, and platelets 268,000.  Ferritin was 10.  Guaiac cards 2 were positive for occult blood.  Patient received weekly Venofer 200 mg x 3 doses (07/30/2016 - 08/13/2016).  CBC on 10/30/2016 revealed a hemoglobin of 12.4, hematocrit 38.3, and MCV 85.2. Ferritin was 15. B12 level was 335.  She was referred to gastroenterology.  Patient received 2 additional doses of Venofer 200 mg (11/04/2016, 11/17/2016). CBC on 12/09/2016 revealed hemoglobin 14.4, hematocrit 43.0, and MCV 86.9. Ferritin was 87. Iron saturations was 11% with a TIBC of 482.  Patient was seen in consult by Dr. Sherri Sear on 12/09/2016. Patient was advised to stop daily use of BC powders and to avoid NSAIDs. She was encouraged to discuss pain management issues with PCP, and to see orthopedics for her chronic neck pain. Prilosec was increased to 40 mg twice a day. Patient was scheduled follow-up with GI in 3 months, at which time she will be considered for an EGD and colonoscopy if her iron deficiency persists. If these studies yield an unclear etiology, Dr. Marius Ditch plans to proceed with a capsule study to further evaluate patient for GI blood loss.   CBC on 01/27/2017 revealing a hemoglobin of 13.9, hematocrit 41.8, and MCV 88.3. Ferritin has drifted back down to 37.  Symptomatically, patient is feeling tired. She has difficulties initiating sleep. She only sleeps 5 hours a night. She has never been diagnosed with OSAH syndrome, however she does snore. Patient denies ice pica at this time. She  states, "I start to crave ice when my levels get really low". Patient maintaining an iron rich diet. She denies bleeding; no hematochezia, melena, or vaginal bleeding. Stool has changed to light brown (rather than dark) after discontinuation of BC powders.   Past Medical History:  Diagnosis Date  . Anemia   . Hypertension   . Skin cancer     Past Surgical History:  Procedure Laterality Date  . CERVICAL ABLATION    . EYE SURGERY    . NASAL SINUS SURGERY  2001  . STRABISMUS SURGERY Right   . WISDOM TOOTH EXTRACTION      Family History  Problem Relation Age of Onset  . COPD Mother   . Skin cancer Mother   . Heart defect Mother   . Heart attack Maternal Aunt   . Heart attack Maternal Uncle   . Breast cancer Maternal Grandmother   . Melanoma Maternal Uncle     Social History:  reports that she has quit smoking. She has never used smokeless tobacco. She reports that she drinks alcohol. Her drug history is not on file.  She has a brown belt in tae kwon do. She lives in [place].  The patient is alone today.  Allergies:  Allergies  Allergen Reactions  . Ondansetron Other (See Comments)    Constipation     Current Medications: Current Outpatient Prescriptions  Medication Sig Dispense Refill  . fluticasone (FLONASE) 50 MCG/ACT nasal spray PLACE 1 SPRAY IN EACH NOSTRIL D  1  . lisinopril (PRINIVIL,ZESTRIL) 10 MG tablet Take  10 mg by mouth daily.    Marland Kitchen lisinopril-hydrochlorothiazide (PRINZIDE,ZESTORETIC) 20-12.5 MG tablet TK 1 T PO  QD  0  . nortriptyline (PAMELOR) 10 MG capsule TK 3 CS PO QD AT DINNER  3  . nortriptyline (PAMELOR) 75 MG capsule Take 90 mg by mouth at bedtime.    Marland Kitchen omeprazole (PRILOSEC) 40 MG capsule Take 1 capsule (40 mg total) by mouth 2 (two) times daily before a meal. 180 capsule 2  . rizatriptan (MAXALT) 10 MG tablet Take 10 mg by mouth as needed for migraine. May repeat in 2 hours if needed    . sertraline (ZOLOFT) 100 MG tablet Take 200 mg by mouth daily.     . simvastatin (ZOCOR) 40 MG tablet Take 40 mg by mouth daily.     No current facility-administered medications for this visit.     Review of Systems:  GENERAL:  Feels tired.  No fevers or sweats.  Weight up 2 pounds. PERFORMANCE STATUS (ECOG):  1 HEENT:  No visual changes, runny nose, sore throat, mouth sores or tenderness. Lungs: No shortness of breath or cough.  No hemoptysis. No known sleep apnea. Cardiac:  No chest pain, palpitations, orthopnea, or PND. GI:  No nausea, vomiting, diarrhea, constipation, melena or hematochezia. GU:  No menses s/p uterine ablation.  No urgency, frequency, dysuria, or hematuria. Musculoskeletal:  No back pain.  Arthritis in neck.  No muscle tenderness. Extremities:  No pain or swelling. Skin:  No rashes or skin changes. Neuro:  No headache, numbness or weakness, balance or coordination issues. Endocrine:  No diabetes, thyroid issues.  Hot flashes- takes black cohash.  No night sweats. Psych:  No mood changes.  Poor sleep. Pain:  No focal pain. Review of systems:  All other systems reviewed and found to be negative.  Physical Exam: Blood pressure 117/86, pulse 80, temperature 97.8 F (36.6 C), temperature source Tympanic, weight 228 lb 13.4 oz (103.8 kg). GENERAL:  Well developed, well nourished, woman sitting comfortably in the exam room in no acute distress. MENTAL STATUS:  Alert and oriented to person, place and time. HEAD:  Short blonde hair with pink highlights.  Normocephalic, atraumatic, face symmetric, no Cushingoid features. EYES:  Glasses.  Pupils equal round and reactive to light and accomodation.  No conjunctivitis or scleral icterus. ENT:  Oropharynx clear without lesion.  Tongue normal. Mucous membranes moist.  RESPIRATORY:  Clear to auscultation without rales, wheezes or rhonchi. CARDIOVASCULAR:  Regular rate and rhythm without murmur, rub or gallop. ABDOMEN:  Soft, non-tender, with active bowel sounds, and no hepatosplenomegaly.  No  masses. SKIN:  Tattoos.  No rashes, ulcers or lesions. EXTREMITIES: No edema, no skin discoloration or tenderness.  No palpable cords. LYMPH NODES: No palpable cervical, supraclavicular, axillary or inguinal adenopathy  NEUROLOGICAL: Unremarkable. PSYCH:  Appropriate.   Orders Only on 01/27/2017  Component Date Value Ref Range Status  . WBC 01/27/2017 6.4  3.6 - 11.0 K/uL Final  . RBC 01/27/2017 4.73  3.80 - 5.20 MIL/uL Final  . Hemoglobin 01/27/2017 13.9  12.0 - 16.0 g/dL Final  . HCT 01/27/2017 41.8  35.0 - 47.0 % Final  . MCV 01/27/2017 88.3  80.0 - 100.0 fL Final  . MCH 01/27/2017 29.3  26.0 - 34.0 pg Final  . MCHC 01/27/2017 33.2  32.0 - 36.0 g/dL Final  . RDW 01/27/2017 16.7* 11.5 - 14.5 % Final  . Platelets 01/27/2017 246  150 - 440 K/uL Final  . Neutrophils Relative % 01/27/2017 61  %  Final  . Neutro Abs 01/27/2017 4.0  1.4 - 6.5 K/uL Final  . Lymphocytes Relative 01/27/2017 21  % Final  . Lymphs Abs 01/27/2017 1.4  1.0 - 3.6 K/uL Final  . Monocytes Relative 01/27/2017 8  % Final  . Monocytes Absolute 01/27/2017 0.5  0.2 - 0.9 K/uL Final  . Eosinophils Relative 01/27/2017 8  % Final  . Eosinophils Absolute 01/27/2017 0.5  0 - 0.7 K/uL Final  . Basophils Relative 01/27/2017 2  % Final  . Basophils Absolute 01/27/2017 0.1  0 - 0.1 K/uL Final  . Ferritin 01/27/2017 37  11 - 307 ng/mL Final    Assessment:  Lisa Vasquez is a 46 y.o. female with iron deficiency anemia. EGD and colonoscopy 3-4 years ago revealed gastritis only. She has been on omeprazole since that time.  She has a history of menorrhagia and status post uterine uterine ablation in 2004.   She has had no menses since that time.  Diet is modest.  She previously took excess BC powders.  She is intolerant of oral iron.  She has had ice pica since 04/2015.  She received IV iron at [facility] in 07/2012.  She received Venofer 800 mg (12/26/2015 - 01/16/2016).  Venofer 200 mg x 3  (07/30/2016 - 08/13/2016). Venofer  200 mg x 2  (11/04/2016, 11/17/2016). She tolerated her infusions well.  Workup on 12/19/2015 revealed a hematocrit of 27.8, hemoglobin 8.4, MCV 67.7, platelets 226,000, white count 4700 with an ANC of 2800.  Ferritin was 9 (low) with an saturation was 2% with a TIBC of 539 (high) c/w iron deficiency.  B12 was 263 (low normal).  MMA was 77 (normal) on 12/26/2015 r/o B12 deficiency.  Folate was 9. Guaiac cards 3 were negative.  She has received Venofer at [facility]: 600 mg (07/30/2016 - 08/13/2016) and 400 mg (11/04/2016, 11/17/2016).  She receives Venofer when her ferritin is < 30 and symptomatic.  Ferritin has been followed:  9 on 12/19/2015, 139 on 01/16/2016, 10 on 07/28/2016, 15 on 10/30/2016, 87 on 12/09/2016, and 37 on 01/27/2017.  Symptomatically, she feels fatigued.  She denies ice pica.  Exam is stable. Hemoglobin [place] 13.9 and hematocrit 41.8. RBC indices are normal. Ferritin is 37.    Plan: 1.  Review labs from 01/27/2017. Hematocrit and MCV are normal. Ferritin has drifted down from 87 to 37. 2.  No Venofer today.  Discuss plan for Venofer when ferritin < 30. 3.  Follow up with GI in November for continued evaluation and treatment. She will likely need an EGD +/- colonoscopy. 4.  RTC in 4 weeks for labs (CBC with diff, ferritin). 5.  RTC in 2 months for MD assessment, labs (CBC with diff, ferritin-day before) and +/- Venofer.   Honor Loh, NP 01/28/2017, 10:42 AM   I saw and evaluated the patient, participating in the key portions of the service and reviewing pertinent diagnostic studies and records.  I reviewed the nurse practitioner's note and agree with the findings and the plan.  The assessment and plan were discussed with the patient.  A few questions were asked by the patient and answered.   Nolon Stalls, MD 01/28/2017,10:42 AM

## 2017-01-28 ENCOUNTER — Inpatient Hospital Stay: Payer: BLUE CROSS/BLUE SHIELD

## 2017-01-28 ENCOUNTER — Inpatient Hospital Stay (HOSPITAL_BASED_OUTPATIENT_CLINIC_OR_DEPARTMENT_OTHER): Payer: BLUE CROSS/BLUE SHIELD | Admitting: Hematology and Oncology

## 2017-01-28 ENCOUNTER — Other Ambulatory Visit: Payer: Self-pay | Admitting: *Deleted

## 2017-01-28 ENCOUNTER — Encounter: Payer: Self-pay | Admitting: Hematology and Oncology

## 2017-01-28 ENCOUNTER — Other Ambulatory Visit: Payer: BLUE CROSS/BLUE SHIELD

## 2017-01-28 VITALS — BP 117/86 | HR 80 | Temp 97.8°F | Wt 228.8 lb

## 2017-01-28 DIAGNOSIS — N920 Excessive and frequent menstruation with regular cycle: Secondary | ICD-10-CM

## 2017-01-28 DIAGNOSIS — R5383 Other fatigue: Secondary | ICD-10-CM

## 2017-01-28 DIAGNOSIS — K2970 Gastritis, unspecified, without bleeding: Secondary | ICD-10-CM | POA: Diagnosis not present

## 2017-01-28 DIAGNOSIS — D500 Iron deficiency anemia secondary to blood loss (chronic): Secondary | ICD-10-CM | POA: Diagnosis not present

## 2017-01-28 DIAGNOSIS — D508 Other iron deficiency anemias: Secondary | ICD-10-CM

## 2017-01-28 NOTE — Progress Notes (Signed)
Patient states she has nausea every morning.  She recently saw GI who increased her Omeprazole to 40 mg twice daily.  Also stopped her from taking BC powders.  States she has a hard time falling asleep but once she goes to sleep she sleeps well. Otherwise, no complaints.

## 2017-01-29 ENCOUNTER — Ambulatory Visit: Payer: BLUE CROSS/BLUE SHIELD

## 2017-02-25 ENCOUNTER — Telehealth: Payer: Self-pay | Admitting: *Deleted

## 2017-02-25 ENCOUNTER — Inpatient Hospital Stay: Payer: BLUE CROSS/BLUE SHIELD | Attending: Hematology and Oncology

## 2017-02-25 DIAGNOSIS — Z87891 Personal history of nicotine dependence: Secondary | ICD-10-CM | POA: Insufficient documentation

## 2017-02-25 DIAGNOSIS — Z85828 Personal history of other malignant neoplasm of skin: Secondary | ICD-10-CM | POA: Insufficient documentation

## 2017-02-25 DIAGNOSIS — I10 Essential (primary) hypertension: Secondary | ICD-10-CM | POA: Insufficient documentation

## 2017-02-25 DIAGNOSIS — K2970 Gastritis, unspecified, without bleeding: Secondary | ICD-10-CM | POA: Diagnosis not present

## 2017-02-25 DIAGNOSIS — D500 Iron deficiency anemia secondary to blood loss (chronic): Secondary | ICD-10-CM | POA: Insufficient documentation

## 2017-02-25 DIAGNOSIS — Z79899 Other long term (current) drug therapy: Secondary | ICD-10-CM | POA: Insufficient documentation

## 2017-02-25 DIAGNOSIS — R5383 Other fatigue: Secondary | ICD-10-CM | POA: Insufficient documentation

## 2017-02-25 DIAGNOSIS — N920 Excessive and frequent menstruation with regular cycle: Secondary | ICD-10-CM | POA: Diagnosis not present

## 2017-02-25 DIAGNOSIS — D508 Other iron deficiency anemias: Secondary | ICD-10-CM

## 2017-02-25 LAB — CBC WITH DIFFERENTIAL/PLATELET
Basophils Absolute: 0.1 10*3/uL (ref 0–0.1)
Basophils Relative: 3 %
Eosinophils Absolute: 0.4 10*3/uL (ref 0–0.7)
Eosinophils Relative: 7 %
HCT: 39.3 % (ref 35.0–47.0)
Hemoglobin: 13.2 g/dL (ref 12.0–16.0)
Lymphocytes Relative: 23 %
Lymphs Abs: 1.2 10*3/uL (ref 1.0–3.6)
MCH: 29.9 pg (ref 26.0–34.0)
MCHC: 33.5 g/dL (ref 32.0–36.0)
MCV: 89.1 fL (ref 80.0–100.0)
Monocytes Absolute: 0.4 10*3/uL (ref 0.2–0.9)
Monocytes Relative: 9 %
Neutro Abs: 3 10*3/uL (ref 1.4–6.5)
Neutrophils Relative %: 58 %
Platelets: 244 10*3/uL (ref 150–440)
RBC: 4.41 MIL/uL (ref 3.80–5.20)
RDW: 14 % (ref 11.5–14.5)
WBC: 5.1 10*3/uL (ref 3.6–11.0)

## 2017-02-25 LAB — FERRITIN: Ferritin: 34 ng/mL (ref 11–307)

## 2017-02-25 NOTE — Telephone Encounter (Signed)
Called patient to inform her that her ferritin is 34.  States she is a little tired, otherwise, no symptoms.  Has RTC appt 12-4 and 12-5.  Wants to wait until that appt.

## 2017-02-25 NOTE — Telephone Encounter (Signed)
-----   Message from Lequita Asal, MD sent at 02/25/2017 12:16 PM EST ----- Regarding: Please call patient  Ferritin is 34.  Usually receives IV iron if symptomatic or ferritin < 30.  M  ----- Message ----- From: Interface, Lab In [place] Sent: 02/25/2017   9:04 AM To: Lequita Asal, MD

## 2017-03-24 ENCOUNTER — Inpatient Hospital Stay: Payer: BLUE CROSS/BLUE SHIELD | Attending: Hematology and Oncology

## 2017-03-24 DIAGNOSIS — Z87891 Personal history of nicotine dependence: Secondary | ICD-10-CM | POA: Insufficient documentation

## 2017-03-24 DIAGNOSIS — D508 Other iron deficiency anemias: Secondary | ICD-10-CM

## 2017-03-24 DIAGNOSIS — D509 Iron deficiency anemia, unspecified: Secondary | ICD-10-CM | POA: Diagnosis not present

## 2017-03-24 DIAGNOSIS — N920 Excessive and frequent menstruation with regular cycle: Secondary | ICD-10-CM | POA: Insufficient documentation

## 2017-03-24 DIAGNOSIS — I10 Essential (primary) hypertension: Secondary | ICD-10-CM | POA: Diagnosis not present

## 2017-03-24 DIAGNOSIS — Z79899 Other long term (current) drug therapy: Secondary | ICD-10-CM | POA: Diagnosis not present

## 2017-03-24 DIAGNOSIS — G2581 Restless legs syndrome: Secondary | ICD-10-CM | POA: Diagnosis not present

## 2017-03-24 DIAGNOSIS — Z85828 Personal history of other malignant neoplasm of skin: Secondary | ICD-10-CM | POA: Insufficient documentation

## 2017-03-24 DIAGNOSIS — K2970 Gastritis, unspecified, without bleeding: Secondary | ICD-10-CM | POA: Diagnosis not present

## 2017-03-24 LAB — CBC WITH DIFFERENTIAL/PLATELET
Basophils Absolute: 0.1 10*3/uL (ref 0–0.1)
Basophils Relative: 1 %
Eosinophils Absolute: 0.4 10*3/uL (ref 0–0.7)
Eosinophils Relative: 9 %
HCT: 44.2 % (ref 35.0–47.0)
Hemoglobin: 14.6 g/dL (ref 12.0–16.0)
Lymphocytes Relative: 30 %
Lymphs Abs: 1.4 10*3/uL (ref 1.0–3.6)
MCH: 29.3 pg (ref 26.0–34.0)
MCHC: 33 g/dL (ref 32.0–36.0)
MCV: 88.9 fL (ref 80.0–100.0)
Monocytes Absolute: 0.5 10*3/uL (ref 0.2–0.9)
Monocytes Relative: 9 %
Neutro Abs: 2.5 10*3/uL (ref 1.4–6.5)
Neutrophils Relative %: 51 %
Platelets: 269 10*3/uL (ref 150–440)
RBC: 4.97 MIL/uL (ref 3.80–5.20)
RDW: 13.5 % (ref 11.5–14.5)
WBC: 4.8 10*3/uL (ref 3.6–11.0)

## 2017-03-24 LAB — FERRITIN: Ferritin: 25 ng/mL (ref 11–307)

## 2017-03-24 NOTE — Progress Notes (Signed)
[facility] day:  03/25/2017  Chief Complaint: Lisa Vasquez is a 46 y.o. female with iron deficiency who is seen for 2 month assessment.  HPI: The patient was last seen in the hematology clinic on 01/28/2017.  At that time, patient was feeling tired. She noted difficulties with her sleep. Patient denied ice pica.  Exam was stable. Hemoglobin was 13.9, hematocrit 41.8, MCV 88.3, and platelets 246,000. Ferritin was 37.  Patient was offered additional iron infusions, however she elected to wait until her 03/2017 appointment.  Labs on 02/25/2017 revealed a hemoglobin of 13.2 with hematocrit 39.3. RBC indices were normal. Ferritin had decreased to 34.  Labs on 03/24/2017 revealed a hemoglobin of 14.6 with hematocrit 44.2. RBC indices were normal. Ferritin was 25.   Symptomatically, patient has been doing well. Patient has fatigue. She is experiencing ice pica once again. States, "I started chewing ice again about 2 weeks ago".  Patient denies obvious bleeding; no hematochezia, melena, or heavy vaginal bleeding. Patient has restless legs. She eats well. Patient eats chicken 4 times a week. She consumes green leafy vegetables on a daily basis.   Patient has pain in her neck and shoulders. Pain is rated 6/10 today.    Past Medical History:  Diagnosis Date  . Anemia   . Hypertension   . Skin cancer     Past Surgical History:  Procedure Laterality Date  . CERVICAL ABLATION    . EYE SURGERY    . NASAL SINUS SURGERY  2001  . STRABISMUS SURGERY Right   . WISDOM TOOTH EXTRACTION      Family History  Problem Relation Age of Onset  . COPD Mother   . Skin cancer Mother   . Heart defect Mother   . Heart attack Maternal Aunt   . Heart attack Maternal Uncle   . Breast cancer Maternal Grandmother   . Melanoma Maternal Uncle     Social History:  reports that she has quit smoking. she has never used smokeless tobacco. She reports that she drinks  alcohol. Her drug history is not on file.  She has a brown belt in tae kwon do. She lives in [place].  The patient is alone today.  Allergies:  Allergies  Allergen Reactions  . Ondansetron Other (See Comments)    Constipation     Current Medications: Current Outpatient Medications  Medication Sig Dispense Refill  . fluticasone (FLONASE) 50 MCG/ACT nasal spray PLACE 1 SPRAY IN EACH NOSTRIL D  1  . lisinopril-hydrochlorothiazide (PRINZIDE,ZESTORETIC) 20-12.5 MG tablet TK 1 T PO  QD  0  . nortriptyline (PAMELOR) 10 MG capsule TK 3 CS PO QD AT DINNER  3  . omeprazole (PRILOSEC) 40 MG capsule Take 1 capsule (40 mg total) by mouth 2 (two) times daily before a meal. 180 capsule 2  . rizatriptan (MAXALT) 10 MG tablet Take 10 mg by mouth as needed for migraine. May repeat in 2 hours if needed    . sertraline (ZOLOFT) 100 MG tablet Take 200 mg by mouth daily.    . simvastatin (ZOCOR) 40 MG tablet Take 40 mg by mouth daily.     No current facility-administered medications for this visit.     Review of Systems:  GENERAL:  Fatigued.  No fevers or sweats.  Weight stable.  PERFORMANCE STATUS (ECOG):  1 HEENT:  No visual changes, runny nose, sore throat, mouth sores or tenderness. Lungs: No shortness of breath or cough.  No hemoptysis. No known sleep apnea. Cardiac:  No chest pain, palpitations, orthopnea, or PND. GI:  No nausea, vomiting, diarrhea, constipation, melena or hematochezia. GU:  No menses s/p uterine ablation.  No urgency, frequency, dysuria, or hematuria. Musculoskeletal:  No back pain.  Arthritis in neck.  No muscle tenderness. Extremities:  No pain or swelling. Skin:  No rashes or skin changes. Neuro:  No headache, numbness or weakness, balance or coordination issues. Endocrine:  No diabetes, thyroid issues.  Hot flashes- takes black cohash.  No night sweats. Psych:  No mood changes.  Poor sleep. Pain:  Neck pain (6 out of 10). Review of systems:  All other systems reviewed and  found to be negative.  Physical Exam: Blood pressure 130/90, pulse 96, temperature 98.4 F (36.9 C), temperature source Tympanic, resp. rate 20, weight 228 lb 6.3 oz (103.6 kg). GENERAL:  Well developed, well nourished, woman sitting comfortably in the exam room in no acute distress. MENTAL STATUS:  Alert and oriented to person, place and time. HEAD:  Short styled blonde hair.  Normocephalic, atraumatic, face symmetric, no Cushingoid features. EYES:  Glasses.  Pupils equal round and reactive to light and accomodation.  No conjunctivitis or scleral icterus. ENT:  Oropharynx clear without lesion.  Tongue normal.  Mucous membranes moist.  RESPIRATORY:  Clear to auscultation without rales, wheezes or rhonchi. CARDIOVASCULAR:  Regular rate and rhythm without murmur, rub or gallop. ABDOMEN:  Soft, non-tender, with active bowel sounds, and no hepatosplenomegaly.  No masses. SKIN:  Tattoos.  No rashes, ulcers or lesions. EXTREMITIES: No edema, no skin discoloration or tenderness.  No palpable cords. LYMPH NODES: No palpable cervical, supraclavicular, axillary or inguinal adenopathy  NEUROLOGICAL: Unremarkable. PSYCH:  Appropriate.   Appointment on 03/24/2017  Component Date Value Ref Range Status  . Ferritin 03/24/2017 25  11 - 307 ng/mL Final  . WBC 03/24/2017 4.8  3.6 - 11.0 K/uL Final  . RBC 03/24/2017 4.97  3.80 - 5.20 MIL/uL Final  . Hemoglobin 03/24/2017 14.6  12.0 - 16.0 g/dL Final  . HCT 03/24/2017 44.2  35.0 - 47.0 % Final  . MCV 03/24/2017 88.9  80.0 - 100.0 fL Final  . MCH 03/24/2017 29.3  26.0 - 34.0 pg Final  . MCHC 03/24/2017 33.0  32.0 - 36.0 g/dL Final  . RDW 03/24/2017 13.5  11.5 - 14.5 % Final  . Platelets 03/24/2017 269  150 - 440 K/uL Final  . Neutrophils Relative % 03/24/2017 51  % Final  . Neutro Abs 03/24/2017 2.5  1.4 - 6.5 K/uL Final  . Lymphocytes Relative 03/24/2017 30  % Final  . Lymphs Abs 03/24/2017 1.4  1.0 - 3.6 K/uL Final  . Monocytes Relative 03/24/2017 9   % Final  . Monocytes Absolute 03/24/2017 0.5  0.2 - 0.9 K/uL Final  . Eosinophils Relative 03/24/2017 9  % Final  . Eosinophils Absolute 03/24/2017 0.4  0 - 0.7 K/uL Final  . Basophils Relative 03/24/2017 1  % Final  . Basophils Absolute 03/24/2017 0.1  0 - 0.1 K/uL Final    Assessment:  Lisa Vasquez is a 46 y.o. female with iron deficiency anemia. EGD and colonoscopy 3-4 years ago revealed gastritis only. She has been on omeprazole since that time.  She has a history of menorrhagia and status post uterine uterine ablation in 2004.   She has had no menses since that time.  Diet is modest.  She previously took excess BC powders.  She is intolerant of oral  iron.  She has had ice pica since 04/2015.  She received IV iron at [facility] in 07/2012.  She received Venofer 800 mg (12/26/2015 - 01/16/2016).  Venofer 200 mg x 3  (07/30/2016 - 08/13/2016). Venofer 200 mg x 2  (11/04/2016, 11/17/2016). She tolerated her infusions well.  Workup on 12/19/2015 revealed a hematocrit of 27.8, hemoglobin 8.4, MCV 67.7, platelets 226,000, white count 4700 with an ANC of 2800.  Ferritin was 9 (low) with an saturation was 2% with a TIBC of 539 (high) c/w iron deficiency.  B12 was 263 (low normal).  MMA was 77 (normal) on 12/26/2015 r/o B12 deficiency.  Folate was 9. Guaiac cards 3 were negative.  She has received Venofer at [facility]: 600 mg (07/30/2016 - 08/13/2016) and 400 mg (11/04/2016, 11/17/2016).  She receives Venofer when her ferritin is < 30 and symptomatic.  Ferritin has been followed:  9 on 12/19/2015, 139 on 01/16/2016, 10 on 07/28/2016, 15 on 10/30/2016, 87 on 12/09/2016, and 37 on 01/27/2017.  Symptomatically, she feels fatigued.  She has ice pica and restless legs. Patient denies obvious bleeding.  Exam is stable. Hemoglobin is 14.6 and hematocrit 44.2.  RBC indices are normal. Ferritin is 25.    Plan: 1.  Review labs from 03/24/2017. Hematocrit and MCV are normal.  Discuss declining iron stores.   Ferritin has drifted down from 37 to 25 in 2 months.  She is symptomatic.  Will schedule for weekly Venofer 200mg  x 2  2.  Discuss follow up with GI  for continued evaluation and treatment. She will likely need an EGD +/- colonoscopy. 3.  RTC in 6 weeks for labs (CBC with diff, ferritin). 4.  RTC in 3 months for MD assessment, labs (CBC with diff, ferritin-day before) and +/- Venofer.   Honor Loh, NP 03/25/2017, 9:15 AM   I saw and evaluated the patient, participating in the key portions of the service and reviewing pertinent diagnostic studies and records.  I reviewed the nurse practitioner's note and agree with the findings and the plan.  The assessment and plan were discussed with the patient.  A few questions were asked by the patient and answered.   Nolon Stalls, MD 03/25/2017,9:15 AM

## 2017-03-25 ENCOUNTER — Telehealth: Payer: Self-pay | Admitting: Gastroenterology

## 2017-03-25 ENCOUNTER — Inpatient Hospital Stay: Payer: BLUE CROSS/BLUE SHIELD

## 2017-03-25 ENCOUNTER — Encounter: Payer: Self-pay | Admitting: Hematology and Oncology

## 2017-03-25 ENCOUNTER — Inpatient Hospital Stay (HOSPITAL_BASED_OUTPATIENT_CLINIC_OR_DEPARTMENT_OTHER): Payer: BLUE CROSS/BLUE SHIELD | Admitting: Hematology and Oncology

## 2017-03-25 VITALS — BP 130/90 | HR 96 | Temp 98.4°F | Resp 20 | Wt 228.4 lb

## 2017-03-25 VITALS — BP 141/96 | HR 92 | Temp 96.0°F | Resp 20

## 2017-03-25 DIAGNOSIS — Z79899 Other long term (current) drug therapy: Secondary | ICD-10-CM

## 2017-03-25 DIAGNOSIS — D508 Other iron deficiency anemias: Secondary | ICD-10-CM

## 2017-03-25 DIAGNOSIS — D509 Iron deficiency anemia, unspecified: Secondary | ICD-10-CM

## 2017-03-25 MED ORDER — IRON SUCROSE 20 MG/ML IV SOLN
200.0000 mg | Freq: Once | INTRAVENOUS | Status: AC
  Administered 2017-03-25: 200 mg via INTRAVENOUS
  Filled 2017-03-25: qty 10

## 2017-03-25 MED ORDER — SODIUM CHLORIDE 0.9 % IV SOLN
Freq: Once | INTRAVENOUS | Status: AC
  Administered 2017-03-25: 09:00:00 via INTRAVENOUS
  Filled 2017-03-25: qty 1000

## 2017-03-25 MED ORDER — IRON SUCROSE 20 MG/ML IV SOLN
INTRAVENOUS | Status: AC
  Filled 2017-03-25: qty 10

## 2017-03-25 NOTE — Telephone Encounter (Signed)
LVM for patient to contact office to schedule colonoscopy/EGD with Dr. Marius Ditch. Dx: Anemia  Thanks Sharyn Lull

## 2017-03-25 NOTE — Telephone Encounter (Signed)
Per [place] this patient needs to be scheduled for a colonoscopy/EGD with Dr. Marius Ditch. Please call.

## 2017-03-25 NOTE — Progress Notes (Signed)
Patient states she is not feeling well lately because of her arthritis.  Otherwise, offers no complaints.

## 2017-04-01 ENCOUNTER — Inpatient Hospital Stay: Payer: BLUE CROSS/BLUE SHIELD

## 2017-04-01 VITALS — BP 133/88 | HR 88 | Temp 97.5°F | Resp 20

## 2017-04-01 DIAGNOSIS — D509 Iron deficiency anemia, unspecified: Secondary | ICD-10-CM

## 2017-04-01 MED ORDER — SODIUM CHLORIDE 0.9 % IV SOLN
Freq: Once | INTRAVENOUS | Status: AC
  Administered 2017-04-01: 09:00:00 via INTRAVENOUS
  Filled 2017-04-01: qty 1000

## 2017-04-01 MED ORDER — IRON SUCROSE 20 MG/ML IV SOLN
200.0000 mg | Freq: Once | INTRAVENOUS | Status: AC
  Administered 2017-04-01: 200 mg via INTRAVENOUS
  Filled 2017-04-01: qty 10

## 2017-04-01 MED ORDER — IRON SUCROSE 20 MG/ML IV SOLN
INTRAVENOUS | Status: AC
  Filled 2017-04-01: qty 10

## 2017-04-01 NOTE — Patient Instructions (Signed)

## 2017-05-05 ENCOUNTER — Inpatient Hospital Stay: Payer: BLUE CROSS/BLUE SHIELD | Attending: Hematology and Oncology

## 2017-05-05 DIAGNOSIS — D508 Other iron deficiency anemias: Secondary | ICD-10-CM

## 2017-05-05 DIAGNOSIS — D509 Iron deficiency anemia, unspecified: Secondary | ICD-10-CM | POA: Diagnosis not present

## 2017-05-05 LAB — CBC WITH DIFFERENTIAL/PLATELET
Basophils Absolute: 0.1 10*3/uL (ref 0–0.1)
Basophils Relative: 1 %
Eosinophils Absolute: 0.3 10*3/uL (ref 0–0.7)
Eosinophils Relative: 4 %
HCT: 36.9 % (ref 35.0–47.0)
Hemoglobin: 12.3 g/dL (ref 12.0–16.0)
Lymphocytes Relative: 20 %
Lymphs Abs: 1.2 10*3/uL (ref 1.0–3.6)
MCH: 30.2 pg (ref 26.0–34.0)
MCHC: 33.2 g/dL (ref 32.0–36.0)
MCV: 90.8 fL (ref 80.0–100.0)
Monocytes Absolute: 0.5 10*3/uL (ref 0.2–0.9)
Monocytes Relative: 8 %
Neutro Abs: 4 10*3/uL (ref 1.4–6.5)
Neutrophils Relative %: 67 %
Platelets: 209 10*3/uL (ref 150–440)
RBC: 4.07 MIL/uL (ref 3.80–5.20)
RDW: 14.4 % (ref 11.5–14.5)
WBC: 6 10*3/uL (ref 3.6–11.0)

## 2017-05-05 LAB — FERRITIN: Ferritin: 64 ng/mL (ref 11–307)

## 2017-05-06 ENCOUNTER — Other Ambulatory Visit: Payer: BLUE CROSS/BLUE SHIELD

## 2017-06-23 ENCOUNTER — Inpatient Hospital Stay: Payer: BLUE CROSS/BLUE SHIELD | Attending: Hematology and Oncology

## 2017-06-23 DIAGNOSIS — D509 Iron deficiency anemia, unspecified: Secondary | ICD-10-CM | POA: Diagnosis present

## 2017-06-23 DIAGNOSIS — D508 Other iron deficiency anemias: Secondary | ICD-10-CM

## 2017-06-23 LAB — CBC WITH DIFFERENTIAL/PLATELET
Basophils Absolute: 0.1 10*3/uL (ref 0–0.1)
Basophils Relative: 1 %
Eosinophils Absolute: 0.7 10*3/uL (ref 0–0.7)
Eosinophils Relative: 8 %
HCT: 43.5 % (ref 35.0–47.0)
Hemoglobin: 14.7 g/dL (ref 12.0–16.0)
Lymphocytes Relative: 18 %
Lymphs Abs: 1.6 10*3/uL (ref 1.0–3.6)
MCH: 30.4 pg (ref 26.0–34.0)
MCHC: 33.9 g/dL (ref 32.0–36.0)
MCV: 89.6 fL (ref 80.0–100.0)
Monocytes Absolute: 0.7 10*3/uL (ref 0.2–0.9)
Monocytes Relative: 8 %
Neutro Abs: 6 10*3/uL (ref 1.4–6.5)
Neutrophils Relative %: 65 %
Platelets: 243 10*3/uL (ref 150–440)
RBC: 4.86 MIL/uL (ref 3.80–5.20)
RDW: 14.2 % (ref 11.5–14.5)
WBC: 9.1 10*3/uL (ref 3.6–11.0)

## 2017-06-23 LAB — FERRITIN: Ferritin: 69 ng/mL (ref 11–307)

## 2017-06-23 NOTE — Progress Notes (Deleted)
[facility] day:  06/23/2017  Chief Complaint: Lisa Vasquez is a 47 y.o. female with iron deficiency who is seen for 3 month assessment.  HPI: The patient was last seen in the hematology clinic on 03/25/2017.  At that time, patient was doing well overall.  Patient complained of fatigue, ice pica, and restless leg symptoms.  She stated "I started chewing ice again about 2 weeks ago".  Patient denied bleeding.  Diet was good.  Patient complained of pain in her neck and shoulders rated 6 out of 10.  CBC was unremarkable.  Ferritin was low at 25.  Patient was scheduled for weekly Venofer 200 mg x2 doses.  Advised to follow-up with GI for possible EGD and colonoscopy.  Patient received Venofer 200 mg on 03/25/2017 and 04/01/2017.    Labs on 05/05/2017  showed a hemoglobin of 12.3, hematocrit 36.9, MCV 90.8, and platelets 209,000.  Ferritin was 64 (previously 25). Labs on 06/23/2017 revealed a hemoglobin 14.7, hematocrit 43.5, MCV 89.6, and platelets 243,000.  Ferritin was 69 (previously 64).  Message was left with patient on 03/25/2017 by the staff at Dr. Verlin Grills office to discuss need for recommended colonoscopy and EGD.  There is no documentation suggesting that the patient return to call to their office.  Symptomatically,   Past Medical History:  Diagnosis Date  . Anemia   . Hypertension   . Skin cancer     Past Surgical History:  Procedure Laterality Date  . CERVICAL ABLATION    . EYE SURGERY    . NASAL SINUS SURGERY  2001  . STRABISMUS SURGERY Right   . WISDOM TOOTH EXTRACTION      Family History  Problem Relation Age of Onset  . COPD Mother   . Skin cancer Mother   . Heart defect Mother   . Heart attack Maternal Aunt   . Heart attack Maternal Uncle   . Breast cancer Maternal Grandmother   . Melanoma Maternal Uncle     Social History:  reports that she has quit smoking. she has never used smokeless tobacco. She reports that  she drinks alcohol. Her drug history is not on file.  She has a brown belt in tae kwon do. She lives in [place].  The patient is alone today.  Allergies:  Allergies  Allergen Reactions  . Ondansetron Other (See Comments)    Constipation     Current Medications: Current Outpatient Medications  Medication Sig Dispense Refill  . fluticasone (FLONASE) 50 MCG/ACT nasal spray PLACE 1 SPRAY IN EACH NOSTRIL D  1  . lisinopril-hydrochlorothiazide (PRINZIDE,ZESTORETIC) 20-12.5 MG tablet TK 1 T PO  QD  0  . nortriptyline (PAMELOR) 10 MG capsule TK 3 CS PO QD AT DINNER  3  . omeprazole (PRILOSEC) 40 MG capsule Take 1 capsule (40 mg total) by mouth 2 (two) times daily before a meal. 180 capsule 2  . rizatriptan (MAXALT) 10 MG tablet Take 10 mg by mouth as needed for migraine. May repeat in 2 hours if needed    . sertraline (ZOLOFT) 100 MG tablet Take 200 mg by mouth daily.    . simvastatin (ZOCOR) 40 MG tablet Take 40 mg by mouth daily.     No current facility-administered medications for this visit.     Review of Systems:  GENERAL:  Fatigued.  No fevers or sweats.  Weight stable.  PERFORMANCE STATUS (ECOG):  1 HEENT:  No visual changes, runny nose, sore throat,  mouth sores or tenderness. Lungs: No shortness of breath or cough.  No hemoptysis. No known sleep apnea. Cardiac:  No chest pain, palpitations, orthopnea, or PND. GI:  No nausea, vomiting, diarrhea, constipation, melena or hematochezia. GU:  No menses s/p uterine ablation.  No urgency, frequency, dysuria, or hematuria. Musculoskeletal:  No back pain.  Arthritis in neck.  No muscle tenderness. Extremities:  No pain or swelling. Skin:  No rashes or skin changes. Neuro:  No headache, numbness or weakness, balance or coordination issues. Endocrine:  No diabetes, thyroid issues.  Hot flashes- takes black cohash.  No night sweats. Psych:  No mood changes.  Poor sleep. Pain:  Neck pain (6 out of 10). Review of systems:  All other systems  reviewed and found to be negative.  Physical Exam: There were no vitals taken for this visit. GENERAL:  Well developed, well nourished, woman sitting comfortably in the exam room in no acute distress. MENTAL STATUS:  Alert and oriented to person, place and time. HEAD:  Short styled blonde hair.  Normocephalic, atraumatic, face symmetric, no Cushingoid features. EYES:  Glasses.  Pupils equal round and reactive to light and accomodation.  No conjunctivitis or scleral icterus. ENT:  Oropharynx clear without lesion.  Tongue normal.  Mucous membranes moist.  RESPIRATORY:  Clear to auscultation without rales, wheezes or rhonchi. CARDIOVASCULAR:  Regular rate and rhythm without murmur, rub or gallop. ABDOMEN:  Soft, non-tender, with active bowel sounds, and no hepatosplenomegaly.  No masses. SKIN:  Tattoos.  No rashes, ulcers or lesions. EXTREMITIES: No edema, no skin discoloration or tenderness.  No palpable cords. LYMPH NODES: No palpable cervical, supraclavicular, axillary or inguinal adenopathy  NEUROLOGICAL: Unremarkable. PSYCH:  Appropriate.   Appointment on 06/23/2017  Component Date Value Ref Range Status  . Ferritin 06/23/2017 69  11 - 307 ng/mL Final   Performed at [facility], [address] [place], [place] [address]  . WBC 06/23/2017 9.1  3.6 - 11.0 K/uL Final  . RBC 06/23/2017 4.86  3.80 - 5.20 MIL/uL Final  . Hemoglobin 06/23/2017 14.7  12.0 - 16.0 g/dL Final  . HCT 06/23/2017 43.5  35.0 - 47.0 % Final  . MCV 06/23/2017 89.6  80.0 - 100.0 fL Final  . MCH 06/23/2017 30.4  26.0 - 34.0 pg Final  . MCHC 06/23/2017 33.9  32.0 - 36.0 g/dL Final  . RDW 06/23/2017 14.2  11.5 - 14.5 % Final  . Platelets 06/23/2017 243  150 - 440 K/uL Final  . Neutrophils Relative % 06/23/2017 65  % Final  . Neutro Abs 06/23/2017 6.0  1.4 - 6.5 K/uL Final  . Lymphocytes Relative 06/23/2017 18  % Final  . Lymphs Abs 06/23/2017 1.6  1.0 - 3.6 K/uL Final  . Monocytes Relative 06/23/2017 8   % Final  . Monocytes Absolute 06/23/2017 0.7  0.2 - 0.9 K/uL Final  . Eosinophils Relative 06/23/2017 8  % Final  . Eosinophils Absolute 06/23/2017 0.7  0 - 0.7 K/uL Final  . Basophils Relative 06/23/2017 1  % Final  . Basophils Absolute 06/23/2017 0.1  0 - 0.1 K/uL Final   Performed at [facility], [address] [place],  [address]    Assessment:  Lisa Vasquez is a 47 y.o. female with iron deficiency anemia. EGD and colonoscopy 3-4 years ago revealed gastritis only. She has been on omeprazole since that time.  She has a history of menorrhagia and status post uterine uterine ablation in 2004.  She has had no menses since that time.  Diet is modest.  She previously took excess BC powders.  She is intolerant of oral iron.  She has had ice pica since 04/2015.  She received IV iron at [facility] in 07/2012.  She received Venofer 800 mg (12/26/2015 - 01/16/2016).  Venofer 200 mg x 3  (07/30/2016 - 08/13/2016). Venofer 200 mg x 2  (11/04/2016, 11/17/2016). She tolerated her infusions well.  Workup on 12/19/2015 revealed a hematocrit of 27.8, hemoglobin 8.4, MCV 67.7, platelets 226,000, white count 4700 with an ANC of 2800.  Ferritin was 9 (low) with an saturation was 2% with a TIBC of 539 (high) c/w iron deficiency.  B12 was 263 (low normal).  MMA was 77 (normal) on 12/26/2015 r/o B12 deficiency.  Folate was 9. Guaiac cards 3 were negative.  She has received Venofer at [facility]: 600 mg (07/30/2016 - 08/13/2016), 400 mg (11/04/2016, 11/17/2016), and 400 mg (03/25/2017, 04/01/2017).  She receives Venofer when her ferritin is < 30 and symptomatic.  Ferritin has been followed:  9 on 12/19/2015, 139 on 01/16/2016, 10 on 07/28/2016, 15 on 10/30/2016, 87 on 12/09/2016, 37 on 01/27/2017, 34 02/25/2017, 25 on 03/24/2017, 64 on 05/05/2017, and 69 on 06/23/2017.  Symptomatically,  she feels fatigued.  She has ice pica and restless legs. Patient denies obvious bleeding.  Exam is stable.    Hemoglobin 14.7 with hematocrit 43.5.  RBC indices are normal.  Ferritin stable at 69.  Plan: 1.  Review labs from 06/23/2017. Hematocrit and MCV are normal. Ferritin stable at 69.  She receives Venofer if her ferritin is < 30. No Venofer today. 2.  Discuss follow up with GI  for continued evaluation and treatment. She will likely need an EGD +/- colonoscopy. 3.  RTC in 6 weeks for labs (CBC with diff, ferritin). 4.  RTC in 3 months for MD assessment, labs (CBC with diff, ferritin-day before) and +/- Venofer.   Honor Loh, NP 06/23/2017, 4:17 PM   I saw and evaluated the patient, participating in the key portions of the service and reviewing pertinent diagnostic studies and records.  I reviewed the nurse practitioner's note and agree with the findings and the plan.  The assessment and plan were discussed with the patient.  A few questions were asked by the patient and answered.   Nolon Stalls, MD 06/23/2017,4:17 PM

## 2017-06-24 ENCOUNTER — Inpatient Hospital Stay: Payer: BLUE CROSS/BLUE SHIELD | Admitting: Hematology and Oncology

## 2017-06-24 ENCOUNTER — Inpatient Hospital Stay: Payer: BLUE CROSS/BLUE SHIELD

## 2017-12-03 ENCOUNTER — Other Ambulatory Visit: Payer: Self-pay | Admitting: Gastroenterology

## 2018-02-05 ENCOUNTER — Other Ambulatory Visit: Payer: Self-pay | Admitting: Hematology and Oncology

## 2018-02-05 DIAGNOSIS — D509 Iron deficiency anemia, unspecified: Secondary | ICD-10-CM

## 2018-02-09 ENCOUNTER — Inpatient Hospital Stay: Payer: BLUE CROSS/BLUE SHIELD | Attending: Hematology and Oncology

## 2018-02-09 DIAGNOSIS — D500 Iron deficiency anemia secondary to blood loss (chronic): Secondary | ICD-10-CM

## 2018-02-09 DIAGNOSIS — D509 Iron deficiency anemia, unspecified: Secondary | ICD-10-CM | POA: Diagnosis not present

## 2018-02-09 DIAGNOSIS — R42 Dizziness and giddiness: Secondary | ICD-10-CM | POA: Diagnosis not present

## 2018-02-09 DIAGNOSIS — G2581 Restless legs syndrome: Secondary | ICD-10-CM | POA: Diagnosis not present

## 2018-02-09 DIAGNOSIS — R5383 Other fatigue: Secondary | ICD-10-CM | POA: Insufficient documentation

## 2018-02-09 LAB — CBC WITH DIFFERENTIAL/PLATELET
Abs Immature Granulocytes: 0.05 10*3/uL (ref 0.00–0.07)
Basophils Absolute: 0.1 10*3/uL (ref 0.0–0.1)
Basophils Relative: 1 %
Eosinophils Absolute: 0.1 10*3/uL (ref 0.0–0.5)
Eosinophils Relative: 2 %
HCT: 28.3 % — ABNORMAL LOW (ref 36.0–46.0)
Hemoglobin: 8 g/dL — ABNORMAL LOW (ref 12.0–15.0)
Immature Granulocytes: 1 %
Lymphocytes Relative: 15 %
Lymphs Abs: 1 10*3/uL (ref 0.7–4.0)
MCH: 20.8 pg — ABNORMAL LOW (ref 26.0–34.0)
MCHC: 28.3 g/dL — ABNORMAL LOW (ref 30.0–36.0)
MCV: 73.7 fL — ABNORMAL LOW (ref 80.0–100.0)
Monocytes Absolute: 0.3 10*3/uL (ref 0.1–1.0)
Monocytes Relative: 5 %
Neutro Abs: 5.4 10*3/uL (ref 1.7–7.7)
Neutrophils Relative %: 76 %
Platelets: 311 10*3/uL (ref 150–400)
RBC: 3.84 MIL/uL — ABNORMAL LOW (ref 3.87–5.11)
RDW: 17 % — ABNORMAL HIGH (ref 11.5–15.5)
WBC: 7 10*3/uL (ref 4.0–10.5)
nRBC: 0.4 % — ABNORMAL HIGH (ref 0.0–0.2)

## 2018-02-09 LAB — FERRITIN: Ferritin: 8 ng/mL — ABNORMAL LOW (ref 11–307)

## 2018-02-10 ENCOUNTER — Inpatient Hospital Stay: Payer: BLUE CROSS/BLUE SHIELD

## 2018-02-10 ENCOUNTER — Inpatient Hospital Stay (HOSPITAL_BASED_OUTPATIENT_CLINIC_OR_DEPARTMENT_OTHER): Payer: BLUE CROSS/BLUE SHIELD | Admitting: Urgent Care

## 2018-02-10 VITALS — BP 160/97 | HR 81 | Resp 18

## 2018-02-10 VITALS — BP 158/91 | HR 77 | Temp 95.4°F | Resp 18 | Wt 235.9 lb

## 2018-02-10 DIAGNOSIS — D509 Iron deficiency anemia, unspecified: Secondary | ICD-10-CM | POA: Diagnosis not present

## 2018-02-10 DIAGNOSIS — R42 Dizziness and giddiness: Secondary | ICD-10-CM | POA: Diagnosis not present

## 2018-02-10 DIAGNOSIS — R5383 Other fatigue: Secondary | ICD-10-CM

## 2018-02-10 DIAGNOSIS — D500 Iron deficiency anemia secondary to blood loss (chronic): Secondary | ICD-10-CM

## 2018-02-10 DIAGNOSIS — G2581 Restless legs syndrome: Secondary | ICD-10-CM

## 2018-02-10 DIAGNOSIS — R0600 Dyspnea, unspecified: Secondary | ICD-10-CM

## 2018-02-10 LAB — IRON AND TIBC
Iron: 14 ug/dL — ABNORMAL LOW (ref 28–170)
Saturation Ratios: 3 % — ABNORMAL LOW (ref 10.4–31.8)
TIBC: 571 ug/dL — ABNORMAL HIGH (ref 250–450)
UIBC: 557 ug/dL

## 2018-02-10 MED ORDER — IRON SUCROSE 20 MG/ML IV SOLN
200.0000 mg | Freq: Once | INTRAVENOUS | Status: AC
  Administered 2018-02-10: 200 mg via INTRAVENOUS
  Filled 2018-02-10: qty 10

## 2018-02-10 MED ORDER — SODIUM CHLORIDE 0.9 % IV SOLN
Freq: Once | INTRAVENOUS | Status: AC
  Administered 2018-02-10: 10:00:00 via INTRAVENOUS
  Filled 2018-02-10: qty 250

## 2018-02-10 MED ORDER — SODIUM CHLORIDE 0.9 % IV SOLN: 200.0000 mg | Freq: Once | INTRAVENOUS | Status: DC

## 2018-02-10 NOTE — Progress Notes (Signed)
Patient is feeling fatigued.  Also c/o nausea in the mornings.  States she does not have it at all when her iron is normal.

## 2018-02-10 NOTE — Progress Notes (Signed)
[facility] day:  02/10/2018  Chief Complaint: Lisa Vasquez is a 47 y.o. female with iron deficiency who is seen for a 21-month assessment and consideration of additional IV iron infusions.  HPI: The patient was last seen in the hematology clinic on 03/25/2017.  At that time, patient complained of being fatigued.  Approximately 2 weeks prior to her visit, patient began to experience significant ice pica.  She stated, "I started chewing ice again".  Patient denied bleeding.  She had restless leg symptoms.  Exam was stable.  Hemoglobin 14.6 and hematocrit 44.2.  RBC indices were normal.  Ferritin was low at 25.  Patient received Venofer 200 mg IV x2 (03/25/2017 and 04/01/2017).  CBC on 05/05/2017: WBC of 6000 (ANC 4000).  Hemoglobin 12.2, hematocrit 36.9, MCV 90.8, and platelets 209,000.  Ferritin had improved to 64 ng/mL. CBC on 06/23/2017: WBC of 9100 (ANC 6000).  Hemoglobin 14.7, hematocrit 43.5, MCV 89.6, and platelets 243,000.  Ferritin remains stable at 69 ng/mL. CBC on 02/09/2018: WBC of 7000 ([place] 5400).  Hemoglobin   8.0, hematocrit 28.3, MCV 73.7, and platelets 311,000.  Ferritin had significantly dropped to 8 ng/mL  In the interim, patient has been markedly fatigued for the last month.  Patient complains of increasing exertional shortness of breath and a sensation of her heart pounding.  Patient states, "I got really winded coming into the office this morning.  I felt like I was going to have to sit down in the parking lot".  She notes vertiginous symptoms with rapid position changes. (+) ice pica; has been "eating ice" more over the last few weeks.  Patient has significant restless leg symptoms. She denies bleeding; no hematochezia, melena, or gross hematuria.  Patient notes that she had a EGD and colonoscopy 1 year ago, however she is unable to advise which provider performed her procedure.  There is no record of EGD/colonoscopy being done at  [facility], nor are there any recent results available in care everywhere for review.  Patient denies that she has experienced any B symptoms. She denies any interval infections. Patient advises that she maintains an adequate appetite. She is eating well. Weight today is 235 lb 14.3 oz (107 kg), which compared to her last visit to the clinic, represents a 7 pound increase.  Patient denies pain in the clinic today.  Past Medical History:  Diagnosis Date  . Anemia   . Hypertension   . Skin cancer     Past Surgical History:  Procedure Laterality Date  . CERVICAL ABLATION    . EYE SURGERY    . NASAL SINUS SURGERY  2001  . STRABISMUS SURGERY Right   . WISDOM TOOTH EXTRACTION      Family History  Problem Relation Age of Onset  . COPD Mother   . Skin cancer Mother   . Heart defect Mother   . Heart attack Maternal Aunt   . Heart attack Maternal Uncle   . Breast cancer Maternal Grandmother   . Melanoma Maternal Uncle     Social History:  reports that she has quit smoking. She has never used smokeless tobacco. She reports that she drinks alcohol. Her drug history is not on file.  She has a brown belt in tae kwon do. She lives in [place].  The patient is alone today.  Allergies:  Allergies  Allergen Reactions  . Ondansetron Other (See Comments)    Constipation     Current Medications: Current  Outpatient Medications  Medication Sig Dispense Refill  . fluticasone (FLONASE) 50 MCG/ACT nasal spray PLACE 1 SPRAY IN EACH NOSTRIL D  1  . nortriptyline (PAMELOR) 10 MG capsule TK 3 CS PO QD AT DINNER  3  . omeprazole (PRILOSEC) 40 MG capsule Take 1 capsule (40 mg total) by mouth 2 (two) times daily before a meal. 180 capsule 2  . rizatriptan (MAXALT) 10 MG tablet Take 10 mg by mouth as needed for migraine. May repeat in 2 hours if needed    . sertraline (ZOLOFT) 100 MG tablet Take 200 mg by mouth daily.    . simvastatin (ZOCOR) 40 MG tablet Take 40 mg by mouth daily.    . cyclobenzaprine  (FLEXERIL) 10 MG tablet Take 10 mg by mouth daily.  1  . lisinopril (PRINIVIL,ZESTRIL) 10 MG tablet Take 10 mg by mouth daily.  0  . metoprolol succinate (TOPROL-XL) 25 MG 24 hr tablet Take 25 mg by mouth daily.  1   No current facility-administered medications for this visit.     Review of Systems  Constitutional: Positive for malaise/fatigue. Negative for diaphoresis, fever and weight loss (up 7 pounds).  HENT: Negative.   Eyes: Negative.   Respiratory: Positive for shortness of breath (exerional). Negative for cough, hemoptysis and sputum production.   Cardiovascular: Negative for chest pain, palpitations, orthopnea, leg swelling and PND.       Periods of pounding heart  Gastrointestinal: Positive for heartburn. Negative for abdominal pain, blood in stool, constipation, diarrhea, melena, nausea and vomiting.  Genitourinary: Negative for dysuria, frequency, hematuria and urgency.  Musculoskeletal: Positive for neck pain (Arthritis). Negative for back pain, falls, joint pain and myalgias.  Skin: Negative for itching and rash.  Neurological: Positive for dizziness. Negative for tremors, weakness and headaches.  Endo/Heme/Allergies: Does not bruise/bleed easily.       Vasomotor symptoms-uses black cohosh  Psychiatric/Behavioral: Negative for depression, memory loss and suicidal ideas. The patient is not nervous/anxious and does not have insomnia.   All other systems reviewed and are negative.  Performance status (ECOG): 1 - Symptomatic but completely ambulatory  Vital Signs BP (!) 158/91 (BP Location: Right Arm, Patient Position: Sitting)   Pulse 77   Temp (!) 95.4 F (35.2 C) (Tympanic)   Resp 18   Wt 235 lb 14.3 oz (107 kg)   SpO2 99%   BMI 38.66 kg/m   Physical Exam  Constitutional: She is oriented to person, place, and time and well-developed, well-nourished, and in no distress.  HENT:  Head: Normocephalic and atraumatic.  Mouth/Throat: Oropharynx is clear and moist and  mucous membranes are normal.  Blonde hair  Eyes: Pupils are equal, round, and reactive to light. EOM are normal. No scleral icterus.  Glasses  Neck: Normal range of motion. Neck supple. No tracheal deviation present. No thyromegaly present.  Cardiovascular: Normal rate, regular rhythm, normal heart sounds and intact distal pulses. Exam reveals no gallop and no friction rub.  No murmur heard. Pulses bounding  Pulmonary/Chest: Effort normal and breath sounds normal. No respiratory distress. She has no wheezes. She has no rales.  Abdominal: Soft. Bowel sounds are normal. She exhibits no distension. There is no tenderness.  Musculoskeletal: Normal range of motion. She exhibits no edema or tenderness.  Lymphadenopathy:    She has no cervical adenopathy.    She has no axillary adenopathy.       Right: No inguinal and no supraclavicular adenopathy present.       Left: No  inguinal and no supraclavicular adenopathy present.  Neurological: She is alert and oriented to person, place, and time.  Skin: Skin is warm and dry. No rash noted. No erythema. There is pallor.  Psychiatric: Mood, affect and judgment normal.  Nursing note and vitals reviewed.    Appointment on 02/09/2018  Component Date Value Ref Range Status  . Ferritin 02/09/2018 8* 11 - 307 ng/mL Final   Performed at [facility], [address] [place], [place] [address]  . WBC 02/09/2018 7.0  4.0 - 10.5 K/uL Final  . RBC 02/09/2018 3.84* 3.87 - 5.11 MIL/uL Final  . Hemoglobin 02/09/2018 8.0* 12.0 - 15.0 g/dL Final  . HCT 02/09/2018 28.3* 36.0 - 46.0 % Final  . MCV 02/09/2018 73.7* 80.0 - 100.0 fL Final  . MCH 02/09/2018 20.8* 26.0 - 34.0 pg Final  . MCHC 02/09/2018 28.3* 30.0 - 36.0 g/dL Final  . RDW 02/09/2018 17.0* 11.5 - 15.5 % Final  . Platelets 02/09/2018 311  150 - 400 K/uL Final  . nRBC 02/09/2018 0.4* 0.0 - 0.2 % Final  . Neutrophils Relative % 02/09/2018 76  % Final  . Neutro Abs 02/09/2018 5.4  1.7 - 7.7 K/uL  Final  . Lymphocytes Relative 02/09/2018 15  % Final  . Lymphs Abs 02/09/2018 1.0  0.7 - 4.0 K/uL Final  . Monocytes Relative 02/09/2018 5  % Final  . Monocytes Absolute 02/09/2018 0.3  0.1 - 1.0 K/uL Final  . Eosinophils Relative 02/09/2018 2  % Final  . Eosinophils Absolute 02/09/2018 0.1  0.0 - 0.5 K/uL Final  . Basophils Relative 02/09/2018 1  % Final  . Basophils Absolute 02/09/2018 0.1  0.0 - 0.1 K/uL Final  . Immature Granulocytes 02/09/2018 1  % Final  . Abs Immature Granulocytes 02/09/2018 0.05  0.00 - 0.07 K/uL Final   Performed at [facility], [address] [place], [place] [address]    Assessment:  Lisa Vasquez is a 47 y.o. female with iron deficiency anemia. EGD and colonoscopy 3-4 years ago revealed gastritis only. She has been on omeprazole since that time.  She has a history of menorrhagia and status post uterine uterine ablation in 2004.   She has had no menses since that time.  Diet is modest.  She previously took excess BC powders.  She is intolerant of oral iron.  She has had ice pica since 04/2015.  She received IV iron at [facility] in 07/2012.  She received Venofer 800 mg (12/26/2015 - 01/16/2016).  Venofer 200 mg x 3  (07/30/2016 - 08/13/2016). Venofer 200 mg x 2  (11/04/2016, 11/17/2016). She tolerated her infusions well.  Workup on 12/19/2015 revealed a hematocrit of 27.8, hemoglobin 8.4, MCV 67.7, platelets 226,000, white count 4700 with an ANC of 2800.  Ferritin was 9 (low) with an saturation was 2% with a TIBC of 539 (high) c/w iron deficiency.  B12 was 263 (low normal).  MMA was 77 (normal) on 12/26/2015 r/o B12 deficiency.  Folate was 9. Guaiac cards 3 were negative.  She has received Venofer at [facility]: 600 mg (07/30/2016 - 08/13/2016), 400 mg (11/04/2016, 11/17/2016), and 400 mg (03/25/2017, 04/01/2017).  She receives Venofer when her ferritin is < 30 and symptomatic.  Ferritin has been followed:  9 on 12/19/2015, 139 on 01/16/2016, 10 on  07/28/2016, 15 on 10/30/2016, 87 on 12/09/2016, 37 on 01/27/2017, 34 on 02/25/2017, 25 on 03/24/2017, 64 on 05/05/2017, 69 on 06/23/2017, and 8 on 02/09/2018.  Symptomatically, patient  has been markedly fatigued over the course of the last month.  She has experienced a pounding heartbeat and exertional dyspnea.  Patient has ice pica and restless leg symptoms.  She notes vertiginous symptoms with position changes.  Exam reveals that patient is pale in color in the clinic today.  Vital signs are stable; no tachycardia or tachypnea.  WBC 7000 ([place] 5400).  Hemoglobin 8.0, hematocrit 28.3, MCV 73.7, MCH 20.8, and platelets 311,000.  Ferritin low at 8 ng/mL (previously 69).    Plan: 1. Review labs from 02/09/2018. 2. Iron deficiency anemia  Hemoglobin 8.0, hematocrit 28.3, MCV 73.7, MCH 20.8.   Ferritin low at 8 ng/mL.  Iron saturation 3% with a TIBC of 571.   Labs consistent with known IDA (microcytic hypochromic anemia).  Patient is symptomatic - (+) DOE, vertiginous symptoms, fatigue, ice pica, and RLS.   Discuss need for further treatment of her severe IDA.  Discuss low hemoglobin. PRBC transfusion offered, however patient electing to trial IV iron first.   Patient is intolerant of oral iron.  Discussed need for additional intravenous iron infusions.  Patient amenable.  Venofer 200 mg today, then weekly x3 (total 800 mg)  Discussed need for follow-up with GI.  Patient advising that she had EGD/colonoscopy done a year ago and it was "clean".  Unable to locate records and [facility]/care everywhere.  Last note from Dr. Sherri Sear (GI), dated 12/09/2016, indicated that patient needed a VCE study.  It appears that patient was lost to follow-up, as she was supposed to follow-up in 3 months with GI.  Will schedule patient an appointment with Dr. Marius Ditch prior to her leaving the clinic today.  Discuss low hemoglobin. PRBC transfusion offered, however patient electing to trial IV iron first.  3.   RTC 2 weeks after last visit Venofer infusion for labs (CBC with diff, ferritin). 4.  RTC in 3 months for MD assessment, labs (CBC with diff, ferritin-day before) and +/- Venofer.   Honor Loh, NP  02/10/2018, 11:25 AM

## 2018-02-10 NOTE — Patient Instructions (Signed)
Iron Sucrose injection What is this medicine? IRON SUCROSE (AHY ern SOO krohs) is an iron complex. Iron is used to make healthy red blood cells, which carry oxygen and nutrients throughout the body. This medicine is used to treat iron deficiency anemia in people with chronic kidney disease. This medicine may be used for other purposes; ask your health care provider or pharmacist if you have questions. COMMON BRAND NAME(S): Venofer What should I tell my health care provider before I take this medicine? They need to know if you have any of these conditions: -anemia not caused by low iron levels -heart disease -high levels of iron in the blood -kidney disease -liver disease -an unusual or allergic reaction to iron, other medicines, foods, dyes, or preservatives -pregnant or trying to get pregnant -breast-feeding How should I use this medicine? This medicine is for infusion into a vein. It is given by a health care professional in a hospital or clinic setting. Talk to your pediatrician regarding the use of this medicine in children. While this drug may be prescribed for children as young as 2 years for selected conditions, precautions do apply. Overdosage: If you think you have taken too much of this medicine contact a poison control center or emergency room at once. NOTE: This medicine is only for you. Do not share this medicine with others. What if I miss a dose? It is important not to miss your dose. Call your doctor or health care professional if you are unable to keep an appointment. What may interact with this medicine? Do not take this medicine with any of the following medications: -deferoxamine -dimercaprol -other iron products This medicine may also interact with the following medications: -chloramphenicol -deferasirox This list may not describe all possible interactions. Give your health care provider a list of all the medicines, herbs, non-prescription drugs, or dietary  supplements you use. Also tell them if you smoke, drink alcohol, or use illegal drugs. Some items may interact with your medicine. What should I watch for while using this medicine? Visit your doctor or healthcare professional regularly. Tell your doctor or healthcare professional if your symptoms do not start to get better or if they get worse. You may need blood work done while you are taking this medicine. You may need to follow a special diet. Talk to your doctor. Foods that contain iron include: whole grains/cereals, dried fruits, beans, or peas, leafy green vegetables, and organ meats (liver, kidney). What side effects may I notice from receiving this medicine? Side effects that you should report to your doctor or health care professional as soon as possible: -allergic reactions like skin rash, itching or hives, swelling of the face, lips, or tongue -breathing problems -changes in blood pressure -cough -fast, irregular heartbeat -feeling faint or lightheaded, falls -fever or chills -flushing, sweating, or hot feelings -joint or muscle aches/pains -seizures -swelling of the ankles or feet -unusually weak or tired Side effects that usually do not require medical attention (report to your doctor or health care professional if they continue or are bothersome): -diarrhea -feeling achy -headache -irritation at site where injected -nausea, vomiting -stomach upset -tiredness This list may not describe all possible side effects. Call your doctor for medical advice about side effects. You may report side effects to FDA at 1-800-FDA-1088. Where should I keep my medicine? This drug is given in a hospital or clinic and will not be stored at home. NOTE: This sheet is a summary. It may not cover all possible information. If   you have questions about this medicine, talk to your doctor, pharmacist, or health care provider.  2018 Elsevier/Gold Standard (2011-01-16 17:14:35)  

## 2018-02-16 ENCOUNTER — Inpatient Hospital Stay: Payer: BLUE CROSS/BLUE SHIELD

## 2018-02-16 VITALS — BP 118/77 | HR 86 | Temp 97.0°F | Resp 16

## 2018-02-16 DIAGNOSIS — D500 Iron deficiency anemia secondary to blood loss (chronic): Secondary | ICD-10-CM

## 2018-02-16 DIAGNOSIS — D509 Iron deficiency anemia, unspecified: Secondary | ICD-10-CM | POA: Diagnosis not present

## 2018-02-16 MED ORDER — IRON SUCROSE 20 MG/ML IV SOLN
200.0000 mg | Freq: Once | INTRAVENOUS | Status: AC
  Administered 2018-02-16: 200 mg via INTRAVENOUS
  Filled 2018-02-16: qty 10

## 2018-02-16 MED ORDER — SODIUM CHLORIDE 0.9 % IV SOLN
Freq: Once | INTRAVENOUS | Status: AC
  Administered 2018-02-16: 15:00:00 via INTRAVENOUS
  Filled 2018-02-16: qty 250

## 2018-02-16 MED ORDER — SODIUM CHLORIDE 0.9 % IV SOLN: 200.0000 mg | Freq: Once | INTRAVENOUS | Status: DC

## 2018-02-16 NOTE — Patient Instructions (Signed)
Iron Sucrose injection What is this medicine? IRON SUCROSE (AHY ern SOO krohs) is an iron complex. Iron is used to make healthy red blood cells, which carry oxygen and nutrients throughout the body. This medicine is used to treat iron deficiency anemia in people with chronic kidney disease. This medicine may be used for other purposes; ask your health care provider or pharmacist if you have questions. COMMON BRAND NAME(S): Venofer What should I tell my health care provider before I take this medicine? They need to know if you have any of these conditions: -anemia not caused by low iron levels -heart disease -high levels of iron in the blood -kidney disease -liver disease -an unusual or allergic reaction to iron, other medicines, foods, dyes, or preservatives -pregnant or trying to get pregnant -breast-feeding How should I use this medicine? This medicine is for infusion into a vein. It is given by a health care professional in a hospital or clinic setting. Talk to your pediatrician regarding the use of this medicine in children. While this drug may be prescribed for children as young as 2 years for selected conditions, precautions do apply. Overdosage: If you think you have taken too much of this medicine contact a poison control center or emergency room at once. NOTE: This medicine is only for you. Do not share this medicine with others. What if I miss a dose? It is important not to miss your dose. Call your doctor or health care professional if you are unable to keep an appointment. What may interact with this medicine? Do not take this medicine with any of the following medications: -deferoxamine -dimercaprol -other iron products This medicine may also interact with the following medications: -chloramphenicol -deferasirox This list may not describe all possible interactions. Give your health care provider a list of all the medicines, herbs, non-prescription drugs, or dietary  supplements you use. Also tell them if you smoke, drink alcohol, or use illegal drugs. Some items may interact with your medicine. What should I watch for while using this medicine? Visit your doctor or healthcare professional regularly. Tell your doctor or healthcare professional if your symptoms do not start to get better or if they get worse. You may need blood work done while you are taking this medicine. You may need to follow a special diet. Talk to your doctor. Foods that contain iron include: whole grains/cereals, dried fruits, beans, or peas, leafy green vegetables, and organ meats (liver, kidney). What side effects may I notice from receiving this medicine? Side effects that you should report to your doctor or health care professional as soon as possible: -allergic reactions like skin rash, itching or hives, swelling of the face, lips, or tongue -breathing problems -changes in blood pressure -cough -fast, irregular heartbeat -feeling faint or lightheaded, falls -fever or chills -flushing, sweating, or hot feelings -joint or muscle aches/pains -seizures -swelling of the ankles or feet -unusually weak or tired Side effects that usually do not require medical attention (report to your doctor or health care professional if they continue or are bothersome): -diarrhea -feeling achy -headache -irritation at site where injected -nausea, vomiting -stomach upset -tiredness This list may not describe all possible side effects. Call your doctor for medical advice about side effects. You may report side effects to FDA at 1-800-FDA-1088. Where should I keep my medicine? This drug is given in a hospital or clinic and will not be stored at home. NOTE: This sheet is a summary. It may not cover all possible information. If   you have questions about this medicine, talk to your doctor, pharmacist, or health care provider.  2018 Elsevier/Gold Standard (2011-01-16 17:14:35)  

## 2018-02-24 ENCOUNTER — Inpatient Hospital Stay: Payer: BLUE CROSS/BLUE SHIELD | Attending: Hematology and Oncology

## 2018-02-24 ENCOUNTER — Encounter: Payer: Self-pay | Admitting: Gastroenterology

## 2018-02-24 ENCOUNTER — Ambulatory Visit: Payer: BLUE CROSS/BLUE SHIELD | Admitting: Gastroenterology

## 2018-02-24 VITALS — BP 155/96 | HR 94 | Temp 97.6°F | Resp 18

## 2018-02-24 DIAGNOSIS — D509 Iron deficiency anemia, unspecified: Secondary | ICD-10-CM | POA: Diagnosis present

## 2018-02-24 MED ORDER — SODIUM CHLORIDE 0.9 % IV SOLN: 200.0000 mg | Freq: Once | INTRAVENOUS | Status: DC

## 2018-02-24 MED ORDER — SODIUM CHLORIDE 0.9 % IV SOLN
Freq: Once | INTRAVENOUS | Status: AC
  Administered 2018-02-24: 15:00:00 via INTRAVENOUS
  Filled 2018-02-24: qty 250

## 2018-02-24 MED ORDER — IRON SUCROSE 20 MG/ML IV SOLN
200.0000 mg | Freq: Once | INTRAVENOUS | Status: AC
  Administered 2018-02-24: 200 mg via INTRAVENOUS

## 2018-03-03 ENCOUNTER — Inpatient Hospital Stay: Payer: BLUE CROSS/BLUE SHIELD | Attending: Hematology and Oncology

## 2018-03-03 VITALS — BP 151/96 | HR 102 | Temp 97.6°F | Resp 18

## 2018-03-03 DIAGNOSIS — D509 Iron deficiency anemia, unspecified: Secondary | ICD-10-CM

## 2018-03-03 MED ORDER — SODIUM CHLORIDE 0.9 % IV SOLN: 200.0000 mg | Freq: Once | INTRAVENOUS | Status: DC

## 2018-03-03 MED ORDER — IRON SUCROSE 20 MG/ML IV SOLN
200.0000 mg | Freq: Once | INTRAVENOUS | Status: AC
  Administered 2018-03-03: 200 mg via INTRAVENOUS

## 2018-03-03 MED ORDER — SODIUM CHLORIDE 0.9 % IV SOLN
Freq: Once | INTRAVENOUS | Status: AC
  Administered 2018-03-03: 15:00:00 via INTRAVENOUS
  Filled 2018-03-03: qty 250

## 2018-03-03 MED ORDER — IRON SUCROSE 20 MG/ML IV SOLN
INTRAVENOUS | Status: AC
  Filled 2018-03-03: qty 10

## 2018-03-03 NOTE — Patient Instructions (Signed)
Iron Sucrose injection What is this medicine? IRON SUCROSE (AHY ern SOO krohs) is an iron complex. Iron is used to make healthy red blood cells, which carry oxygen and nutrients throughout the body. This medicine is used to treat iron deficiency anemia in people with chronic kidney disease. This medicine may be used for other purposes; ask your health care provider or pharmacist if you have questions. COMMON BRAND NAME(S): Venofer What should I tell my health care provider before I take this medicine? They need to know if you have any of these conditions: -anemia not caused by low iron levels -heart disease -high levels of iron in the blood -kidney disease -liver disease -an unusual or allergic reaction to iron, other medicines, foods, dyes, or preservatives -pregnant or trying to get pregnant -breast-feeding How should I use this medicine? This medicine is for infusion into a vein. It is given by a health care professional in a hospital or clinic setting. Talk to your pediatrician regarding the use of this medicine in children. While this drug may be prescribed for children as young as 2 years for selected conditions, precautions do apply. Overdosage: If you think you have taken too much of this medicine contact a poison control center or emergency room at once. NOTE: This medicine is only for you. Do not share this medicine with others. What if I miss a dose? It is important not to miss your dose. Call your doctor or health care professional if you are unable to keep an appointment. What may interact with this medicine? Do not take this medicine with any of the following medications: -deferoxamine -dimercaprol -other iron products This medicine may also interact with the following medications: -chloramphenicol -deferasirox This list may not describe all possible interactions. Give your health care provider a list of all the medicines, herbs, non-prescription drugs, or dietary  supplements you use. Also tell them if you smoke, drink alcohol, or use illegal drugs. Some items may interact with your medicine. What should I watch for while using this medicine? Visit your doctor or healthcare professional regularly. Tell your doctor or healthcare professional if your symptoms do not start to get better or if they get worse. You may need blood work done while you are taking this medicine. You may need to follow a special diet. Talk to your doctor. Foods that contain iron include: whole grains/cereals, dried fruits, beans, or peas, leafy green vegetables, and organ meats (liver, kidney). What side effects may I notice from receiving this medicine? Side effects that you should report to your doctor or health care professional as soon as possible: -allergic reactions like skin rash, itching or hives, swelling of the face, lips, or tongue -breathing problems -changes in blood pressure -cough -fast, irregular heartbeat -feeling faint or lightheaded, falls -fever or chills -flushing, sweating, or hot feelings -joint or muscle aches/pains -seizures -swelling of the ankles or feet -unusually weak or tired Side effects that usually do not require medical attention (report to your doctor or health care professional if they continue or are bothersome): -diarrhea -feeling achy -headache -irritation at site where injected -nausea, vomiting -stomach upset -tiredness This list may not describe all possible side effects. Call your doctor for medical advice about side effects. You may report side effects to FDA at 1-800-FDA-1088. Where should I keep my medicine? This drug is given in a hospital or clinic and will not be stored at home. NOTE: This sheet is a summary. It may not cover all possible information. If   you have questions about this medicine, talk to your doctor, pharmacist, or health care provider.  2018 Elsevier/Gold Standard (2011-01-16 17:14:35)  

## 2018-03-10 ENCOUNTER — Inpatient Hospital Stay: Payer: BLUE CROSS/BLUE SHIELD

## 2018-03-17 ENCOUNTER — Inpatient Hospital Stay: Payer: BLUE CROSS/BLUE SHIELD

## 2018-05-10 ENCOUNTER — Inpatient Hospital Stay: Payer: BLUE CROSS/BLUE SHIELD | Attending: Hematology and Oncology

## 2018-05-10 DIAGNOSIS — D509 Iron deficiency anemia, unspecified: Secondary | ICD-10-CM | POA: Insufficient documentation

## 2018-05-10 DIAGNOSIS — D500 Iron deficiency anemia secondary to blood loss (chronic): Secondary | ICD-10-CM

## 2018-05-10 LAB — CBC WITH DIFFERENTIAL/PLATELET
Abs Immature Granulocytes: 0.04 10*3/uL (ref 0.00–0.07)
Basophils Absolute: 0.1 10*3/uL (ref 0.0–0.1)
Basophils Relative: 1 %
Eosinophils Absolute: 0.7 10*3/uL — ABNORMAL HIGH (ref 0.0–0.5)
Eosinophils Relative: 9 %
HCT: 33.5 % — ABNORMAL LOW (ref 36.0–46.0)
Hemoglobin: 10.1 g/dL — ABNORMAL LOW (ref 12.0–15.0)
Immature Granulocytes: 1 %
Lymphocytes Relative: 19 %
Lymphs Abs: 1.5 10*3/uL (ref 0.7–4.0)
MCH: 25.2 pg — ABNORMAL LOW (ref 26.0–34.0)
MCHC: 30.1 g/dL (ref 30.0–36.0)
MCV: 83.5 fL (ref 80.0–100.0)
Monocytes Absolute: 0.6 10*3/uL (ref 0.1–1.0)
Monocytes Relative: 7 %
Neutro Abs: 5.1 10*3/uL (ref 1.7–7.7)
Neutrophils Relative %: 63 %
Platelets: 291 10*3/uL (ref 150–400)
RBC: 4.01 MIL/uL (ref 3.87–5.11)
RDW: 17.1 % — ABNORMAL HIGH (ref 11.5–15.5)
WBC: 8 10*3/uL (ref 4.0–10.5)
nRBC: 0 % (ref 0.0–0.2)

## 2018-05-10 LAB — FERRITIN: Ferritin: 10 ng/mL — ABNORMAL LOW (ref 11–307)

## 2018-05-12 ENCOUNTER — Inpatient Hospital Stay: Payer: BLUE CROSS/BLUE SHIELD | Admitting: Hematology and Oncology

## 2018-05-12 ENCOUNTER — Inpatient Hospital Stay: Payer: BLUE CROSS/BLUE SHIELD

## 2018-05-12 NOTE — Progress Notes (Deleted)
[facility] day:  05/12/2018  Chief Complaint: Lisa Vasquez is a 48 y.o. female with iron deficiency who is seen for a 3 month assessment.  HPI: The patient was last seen in the hematology clinic on 02/10/2018 by Honor Loh, NP.  At that time, she had been markedly fatigued over the course of the last month.  She had experienced a pounding heartbeat and exertional dyspnea.  She had ice pica and restless leg symptoms.  She noted vertigo with position changes.  She was pale in color.  Vital signs were stable; no tachycardia or tachypnea.  WBC was 7000 ([place] 5400).  Hemoglobin was 8.0, hematocrit 28.3, MCV 73.7, MCH 20.8, and platelets 311,000.  Ferritin was low at 8 ng/mL (previously 69).   Patient declined PRBC transfusion.  She received Venofer weekly x 4 (02/10/2018 - 03/03/2018).  She was referred to GI.  An appointment with Dr. Marius Ditch was scheduled (not performed).  CBC on 05/10/2018: WBC of 8000 ([place] 5100).  Hemoglobin 10.1, hematocrit 33.5, MCV 83.5, and platelets 291,000.  Ferritin was 10.  During the interim,   Past Medical History:  Diagnosis Date  . Anemia   . Hypertension   . Skin cancer     Past Surgical History:  Procedure Laterality Date  . CERVICAL ABLATION    . EYE SURGERY    . NASAL SINUS SURGERY  2001  . STRABISMUS SURGERY Right   . WISDOM TOOTH EXTRACTION      Family History  Problem Relation Age of Onset  . COPD Mother   . Skin cancer Mother   . Heart defect Mother   . Heart attack Maternal Aunt   . Heart attack Maternal Uncle   . Breast cancer Maternal Grandmother   . Melanoma Maternal Uncle     Social History:  reports that she has quit smoking. She has never used smokeless tobacco. She reports current alcohol use. No history on file for drug.  She has a brown belt in tae kwon do. She lives in [place].  The patient is alone today.  Allergies:  Allergies  Allergen Reactions  . Ondansetron Other (See  Comments)    Constipation     Current Medications: Current Outpatient Medications  Medication Sig Dispense Refill  . cyclobenzaprine (FLEXERIL) 10 MG tablet Take 10 mg by mouth daily.  1  . fluticasone (FLONASE) 50 MCG/ACT nasal spray PLACE 1 SPRAY IN EACH NOSTRIL D  1  . lisinopril (PRINIVIL,ZESTRIL) 10 MG tablet Take 10 mg by mouth daily.  0  . metoprolol succinate (TOPROL-XL) 25 MG 24 hr tablet Take 25 mg by mouth daily.  1  . nortriptyline (PAMELOR) 10 MG capsule TK 3 CS PO QD AT DINNER  3  . omeprazole (PRILOSEC) 40 MG capsule Take 1 capsule (40 mg total) by mouth 2 (two) times daily before a meal. 180 capsule 2  . rizatriptan (MAXALT) 10 MG tablet Take 10 mg by mouth as needed for migraine. May repeat in 2 hours if needed    . sertraline (ZOLOFT) 100 MG tablet Take 200 mg by mouth daily.    . simvastatin (ZOCOR) 40 MG tablet Take 40 mg by mouth daily.     No current facility-administered medications for this visit.     Review of Systems  Constitutional: Positive for malaise/fatigue. Negative for diaphoresis, fever and weight loss (up 7 pounds).  HENT: Negative.   Eyes: Negative.   Respiratory: Positive for shortness of  breath (exerional). Negative for cough, hemoptysis and sputum production.   Cardiovascular: Negative for chest pain, palpitations, orthopnea, leg swelling and PND.       Periods of pounding heart  Gastrointestinal: Positive for heartburn. Negative for abdominal pain, blood in stool, constipation, diarrhea, melena, nausea and vomiting.  Genitourinary: Negative for dysuria, frequency, hematuria and urgency.  Musculoskeletal: Positive for neck pain (Arthritis). Negative for back pain, falls, joint pain and myalgias.  Skin: Negative for itching and rash.  Neurological: Positive for dizziness. Negative for tremors, weakness and headaches.  Endo/Heme/Allergies: Does not bruise/bleed easily.       Vasomotor symptoms-uses black cohosh  Psychiatric/Behavioral: Negative  for depression, memory loss and suicidal ideas. The patient is not nervous/anxious and does not have insomnia.   All other systems reviewed and are negative.  Performance status (ECOG): 1 - Symptomatic but completely ambulatory  Vital Signs There were no vitals taken for this visit.  Physical Exam  Constitutional: She is oriented to person, place, and time and well-developed, well-nourished, and in no distress.  HENT:  Head: Normocephalic and atraumatic.  Mouth/Throat: Oropharynx is clear and moist and mucous membranes are normal.  Blonde hair  Eyes: Pupils are equal, round, and reactive to light. EOM are normal. No scleral icterus.  Glasses  Neck: Normal range of motion. Neck supple. No tracheal deviation present. No thyromegaly present.  Cardiovascular: Normal rate, regular rhythm, normal heart sounds and intact distal pulses. Exam reveals no gallop and no friction rub.  No murmur heard. Pulses bounding  Pulmonary/Chest: Effort normal and breath sounds normal. No respiratory distress. She has no wheezes. She has no rales.  Abdominal: Soft. Bowel sounds are normal. She exhibits no distension. There is no abdominal tenderness.  Musculoskeletal: Normal range of motion.        General: No tenderness or edema.  Lymphadenopathy:    She has no cervical adenopathy.    She has no axillary adenopathy.       Right: No inguinal and no supraclavicular adenopathy present.       Left: No inguinal and no supraclavicular adenopathy present.  Neurological: She is alert and oriented to person, place, and time.  Skin: Skin is warm and dry. No rash noted. No erythema. There is pallor.  Psychiatric: Mood, affect and judgment normal.  Nursing note and vitals reviewed.    Appointment on 05/10/2018  Component Date Value Ref Range Status  . Ferritin 05/10/2018 10* 11 - 307 ng/mL Final   Performed at [facility], [address] [place], [place] [address]  . WBC 05/10/2018 8.0  4.0 - 10.5  K/uL Final  . RBC 05/10/2018 4.01  3.87 - 5.11 MIL/uL Final  . Hemoglobin 05/10/2018 10.1* 12.0 - 15.0 g/dL Final  . HCT 05/10/2018 33.5* 36.0 - 46.0 % Final  . MCV 05/10/2018 83.5  80.0 - 100.0 fL Final  . MCH 05/10/2018 25.2* 26.0 - 34.0 pg Final  . MCHC 05/10/2018 30.1  30.0 - 36.0 g/dL Final  . RDW 05/10/2018 17.1* 11.5 - 15.5 % Final  . Platelets 05/10/2018 291  150 - 400 K/uL Final  . nRBC 05/10/2018 0.0  0.0 - 0.2 % Final  . Neutrophils Relative % 05/10/2018 63  % Final  . Neutro Abs 05/10/2018 5.1  1.7 - 7.7 K/uL Final  . Lymphocytes Relative 05/10/2018 19  % Final  . Lymphs Abs 05/10/2018 1.5  0.7 - 4.0 K/uL Final  . Monocytes Relative 05/10/2018 7  % Final  . Monocytes  Absolute 05/10/2018 0.6  0.1 - 1.0 K/uL Final  . Eosinophils Relative 05/10/2018 9  % Final  . Eosinophils Absolute 05/10/2018 0.7* 0.0 - 0.5 K/uL Final  . Basophils Relative 05/10/2018 1  % Final  . Basophils Absolute 05/10/2018 0.1  0.0 - 0.1 K/uL Final  . Immature Granulocytes 05/10/2018 1  % Final  . Abs Immature Granulocytes 05/10/2018 0.04  0.00 - 0.07 K/uL Final   Performed at [facility], [address], [place], [place] [address]    Assessment:  Lisa Vasquez is a 47 y.o. female with iron deficiency anemia. EGD and colonoscopy 3-4 years ago revealed gastritis only. She has been on omeprazole since that time.  She has a history of menorrhagia and status post uterine uterine ablation in 2004.   She has had no menses since that time.  Diet is modest.  She previously took excess BC powders.  She is intolerant of oral iron.  She has had ice pica since 04/2015.  She received IV iron at [facility] in 07/2012.  She received Venofer 800 mg (12/26/2015 - 01/16/2016).  Venofer 200 mg x 3  (07/30/2016 - 08/13/2016). Venofer 200 mg x 2  (11/04/2016, 11/17/2016). She tolerated her infusions well.  Workup on 12/19/2015 revealed a hematocrit of 27.8, hemoglobin 8.4, MCV 67.7, platelets 226,000, white  count 4700 with an ANC of 2800.  Ferritin was 9 (low) with an saturation was 2% with a TIBC of 539 (high) c/w iron deficiency.  B12 was 263 (low normal).  MMA was 77 (normal) on 12/26/2015 r/o B12 deficiency.  Folate was 9. Guaiac cards 3 were negative.  She has received Venofer at [facility]: x3 (07/30/2016 - 08/13/2016), x2 (11/04/2016, 11/17/2016), x2 (03/25/2017, 04/01/2017), and x 4 (02/10/2018 - 03/03/2018).  She receives Venofer when her ferritin is < 30 and symptomatic.  Ferritin has been followed:  9 on 12/19/2015, 139 on 01/16/2016, 10 on 07/28/2016, 15 on 10/30/2016, 87 on 12/09/2016, 37 on 01/27/2017, 34 on 02/25/2017, 25 on 03/24/2017, 64 on 05/05/2017, 69 on 06/23/2017, 8 on 02/09/2018, and 10 on 05/10/2018.  Symptomatically,  patient has been markedly fatigued over the course of the last month.  She has experienced a pounding heartbeat and exertional dyspnea.  Patient has ice pica and restless leg symptoms.  She notes vertiginous symptoms with position changes.  Exam reveals that patient is pale in color in the clinic today.  Vital signs are stable; no tachycardia or tachypnea.  WBC 7000 ([place] 5400).  Hemoglobin 8.0, hematocrit 28.3, MCV 73.7, MCH 20.8, and platelets 311,000.  Ferritin low at 8 ng/mL (previously 69).   Plan: 1. Review labs from 05/10/2018.   2. Iron deficiency anemia  Hemoglobin 8.0, hematocrit 28.3, MCV 73.7, MCH 20.8.   Ferritin low at 8 ng/mL.  Iron saturation 3% with a TIBC of 571.   Labs consistent with known IDA (microcytic hypochromic anemia).  Patient is symptomatic - (+) DOE, vertiginous symptoms, fatigue, ice pica, and RLS.   Discuss need for further treatment of her severe IDA.  Discuss low hemoglobin. PRBC transfusion offered, however patient electing to trial IV iron first.   Patient is intolerant of oral iron.  Discussed need for additional intravenous iron infusions.  Patient amenable.  Venofer 200 mg today, then weekly x3 (total 800  mg)  Discussed need for follow-up with GI.  Patient advising that she had EGD/colonoscopy done a year ago and it was "clean".  Unable to locate records and CHL/care everywhere.  Last note from Dr. Sherri Sear (GI), dated 12/09/2016, indicated that patient needed a VCE study.  It appears that patient was lost to follow-up, as she was supposed to follow-up in 3 months with GI.  Will schedule patient an appointment with Dr. Marius Ditch prior to her leaving the clinic today.  Discuss low hemoglobin. PRBC transfusion offered, however patient electing to trial IV iron first.  3.  RTC 2 weeks after last visit Venofer infusion for labs (CBC with diff, ferritin). 4.  RTC in 3 months for MD assessment, labs (CBC with diff, ferritin-day before) and +/- Venofer.   Honor Loh, NP  05/12/2018, 6:25 AM

## 2018-06-29 ENCOUNTER — Emergency Department: Payer: BLUE CROSS/BLUE SHIELD

## 2018-06-29 ENCOUNTER — Inpatient Hospital Stay
Admission: EM | Admit: 2018-06-29 | Discharge: 2018-07-02 | DRG: 917 | Disposition: A | Discharge status: Home / Self Care | Payer: BLUE CROSS/BLUE SHIELD | Attending: Internal Medicine | Admitting: Internal Medicine

## 2018-06-29 DIAGNOSIS — L03113 Cellulitis of right upper limb: Secondary | ICD-10-CM | POA: Diagnosis present

## 2018-06-29 DIAGNOSIS — Z62810 Personal history of physical and sexual abuse in childhood: Secondary | ICD-10-CM | POA: Diagnosis present

## 2018-06-29 DIAGNOSIS — Z888 Allergy status to other drugs, medicaments and biological substances status: Secondary | ICD-10-CM

## 2018-06-29 DIAGNOSIS — Z560 Unemployment, unspecified: Secondary | ICD-10-CM | POA: Diagnosis not present

## 2018-06-29 DIAGNOSIS — F319 Bipolar disorder, unspecified: Secondary | ICD-10-CM | POA: Diagnosis present

## 2018-06-29 DIAGNOSIS — D509 Iron deficiency anemia, unspecified: Secondary | ICD-10-CM | POA: Diagnosis present

## 2018-06-29 DIAGNOSIS — Z79899 Other long term (current) drug therapy: Secondary | ICD-10-CM | POA: Diagnosis not present

## 2018-06-29 DIAGNOSIS — Z808 Family history of malignant neoplasm of other organs or systems: Secondary | ICD-10-CM | POA: Diagnosis not present

## 2018-06-29 DIAGNOSIS — T424X2A Poisoning by benzodiazepines, intentional self-harm, initial encounter: Secondary | ICD-10-CM | POA: Diagnosis present

## 2018-06-29 DIAGNOSIS — E669 Obesity, unspecified: Secondary | ICD-10-CM | POA: Diagnosis present

## 2018-06-29 DIAGNOSIS — I10 Essential (primary) hypertension: Secondary | ICD-10-CM | POA: Diagnosis present

## 2018-06-29 DIAGNOSIS — Z91411 Personal history of adult psychological abuse: Secondary | ICD-10-CM

## 2018-06-29 DIAGNOSIS — Z87891 Personal history of nicotine dependence: Secondary | ICD-10-CM

## 2018-06-29 DIAGNOSIS — Y908 Blood alcohol level of 240 mg/100 ml or more: Secondary | ICD-10-CM | POA: Diagnosis present

## 2018-06-29 DIAGNOSIS — T1491XA Suicide attempt, initial encounter: Secondary | ICD-10-CM

## 2018-06-29 DIAGNOSIS — J690 Pneumonitis due to inhalation of food and vomit: Secondary | ICD-10-CM | POA: Diagnosis present

## 2018-06-29 DIAGNOSIS — J9600 Acute respiratory failure, unspecified whether with hypoxia or hypercapnia: Secondary | ICD-10-CM

## 2018-06-29 DIAGNOSIS — Z01818 Encounter for other preprocedural examination: Secondary | ICD-10-CM

## 2018-06-29 DIAGNOSIS — T50901A Poisoning by unspecified drugs, medicaments and biological substances, accidental (unintentional), initial encounter: Secondary | ICD-10-CM

## 2018-06-29 DIAGNOSIS — E876 Hypokalemia: Secondary | ICD-10-CM | POA: Diagnosis not present

## 2018-06-29 DIAGNOSIS — J9601 Acute respiratory failure with hypoxia: Secondary | ICD-10-CM | POA: Diagnosis present

## 2018-06-29 DIAGNOSIS — J9621 Acute and chronic respiratory failure with hypoxia: Secondary | ICD-10-CM | POA: Diagnosis present

## 2018-06-29 DIAGNOSIS — F10129 Alcohol abuse with intoxication, unspecified: Secondary | ICD-10-CM | POA: Diagnosis present

## 2018-06-29 DIAGNOSIS — F10929 Alcohol use, unspecified with intoxication, unspecified: Secondary | ICD-10-CM | POA: Diagnosis present

## 2018-06-29 DIAGNOSIS — R739 Hyperglycemia, unspecified: Secondary | ICD-10-CM | POA: Diagnosis not present

## 2018-06-29 DIAGNOSIS — Z6837 Body mass index (BMI) 37.0-37.9, adult: Secondary | ICD-10-CM

## 2018-06-29 DIAGNOSIS — J9690 Respiratory failure, unspecified, unspecified whether with hypoxia or hypercapnia: Secondary | ICD-10-CM

## 2018-06-29 DIAGNOSIS — T50902A Poisoning by unspecified drugs, medicaments and biological substances, intentional self-harm, initial encounter: Secondary | ICD-10-CM | POA: Diagnosis not present

## 2018-06-29 DIAGNOSIS — T510X2A Toxic effect of ethanol, intentional self-harm, initial encounter: Secondary | ICD-10-CM | POA: Diagnosis present

## 2018-06-29 DIAGNOSIS — Z9189 Other specified personal risk factors, not elsewhere classified: Secondary | ICD-10-CM | POA: Diagnosis present

## 2018-06-29 DIAGNOSIS — Z85828 Personal history of other malignant neoplasm of skin: Secondary | ICD-10-CM

## 2018-06-29 DIAGNOSIS — T50902D Poisoning by unspecified drugs, medicaments and biological substances, intentional self-harm, subsequent encounter: Secondary | ICD-10-CM | POA: Diagnosis not present

## 2018-06-29 LAB — CBC WITH DIFFERENTIAL/PLATELET
Abs Immature Granulocytes: 0.15 10*3/uL — ABNORMAL HIGH (ref 0.00–0.07)
Basophils Absolute: 0.2 10*3/uL — ABNORMAL HIGH (ref 0.0–0.1)
Basophils Relative: 2 %
Eosinophils Absolute: 1.7 10*3/uL — ABNORMAL HIGH (ref 0.0–0.5)
Eosinophils Relative: 14 %
HCT: 34.4 % — ABNORMAL LOW (ref 36.0–46.0)
Hemoglobin: 10 g/dL — ABNORMAL LOW (ref 12.0–15.0)
Immature Granulocytes: 1 %
Lymphocytes Relative: 37 %
Lymphs Abs: 4.6 10*3/uL — ABNORMAL HIGH (ref 0.7–4.0)
MCH: 23.3 pg — ABNORMAL LOW (ref 26.0–34.0)
MCHC: 29.1 g/dL — ABNORMAL LOW (ref 30.0–36.0)
MCV: 80.2 fL (ref 80.0–100.0)
Monocytes Absolute: 0.7 10*3/uL (ref 0.1–1.0)
Monocytes Relative: 6 %
Neutro Abs: 5 10*3/uL (ref 1.7–7.7)
Neutrophils Relative %: 40 %
Platelets: 471 10*3/uL — ABNORMAL HIGH (ref 150–400)
RBC: 4.29 MIL/uL (ref 3.87–5.11)
RDW: 15.9 % — ABNORMAL HIGH (ref 11.5–15.5)
WBC: 12.4 10*3/uL — ABNORMAL HIGH (ref 4.0–10.5)
nRBC: 0.6 % — ABNORMAL HIGH (ref 0.0–0.2)

## 2018-06-29 LAB — URINALYSIS, COMPLETE (UACMP) WITH MICROSCOPIC
Bacteria, UA: NONE SEEN
Bilirubin Urine: NEGATIVE
Glucose, UA: NEGATIVE mg/dL
Hgb urine dipstick: NEGATIVE
Ketones, ur: NEGATIVE mg/dL
Leukocytes,Ua: NEGATIVE
Nitrite: NEGATIVE
Protein, ur: NEGATIVE mg/dL
Specific Gravity, Urine: 1.005 (ref 1.005–1.030)
Squamous Epithelial / HPF: NONE SEEN (ref 0–5)
pH: 5 (ref 5.0–8.0)

## 2018-06-29 LAB — URINE DRUG SCREEN, QUALITATIVE (ARMC ONLY)
Amphetamines, Ur Screen: NOT DETECTED
Barbiturates, Ur Screen: NOT DETECTED
Benzodiazepine, Ur Scrn: NOT DETECTED
Cannabinoid 50 Ng, Ur ~~LOC~~: NOT DETECTED
Cocaine Metabolite,Ur ~~LOC~~: NOT DETECTED
MDMA (Ecstasy)Ur Screen: NOT DETECTED
Methadone Scn, Ur: NOT DETECTED
Opiate, Ur Screen: NOT DETECTED
Phencyclidine (PCP) Ur S: NOT DETECTED
Tricyclic, Ur Screen: POSITIVE — AB

## 2018-06-29 LAB — BLOOD GAS, VENOUS
Acid-base deficit: 7.1 mmol/L — ABNORMAL HIGH (ref 0.0–2.0)
Bicarbonate: 21.5 mmol/L (ref 20.0–28.0)
O2 Saturation: 80.9 %
Patient temperature: 37
pCO2, Ven: 55 mmHg (ref 44.0–60.0)
pH, Ven: 7.2 — ABNORMAL LOW (ref 7.250–7.430)
pO2, Ven: 56 mmHg — ABNORMAL HIGH (ref 32.0–45.0)

## 2018-06-29 LAB — COMPREHENSIVE METABOLIC PANEL
ALT: 40 U/L (ref 0–44)
AST: 99 U/L — ABNORMAL HIGH (ref 15–41)
Albumin: 4.1 g/dL (ref 3.5–5.0)
Alkaline Phosphatase: 64 U/L (ref 38–126)
Anion gap: 13 (ref 5–15)
BUN: 10 mg/dL (ref 6–20)
CO2: 20 mmol/L — ABNORMAL LOW (ref 22–32)
Calcium: 8.3 mg/dL — ABNORMAL LOW (ref 8.9–10.3)
Chloride: 107 mmol/L (ref 98–111)
Creatinine, Ser: 0.74 mg/dL (ref 0.44–1.00)
GFR calc Af Amer: >60 mL/min (ref >60)
GFR calc non Af Amer: >60 mL/min (ref >60)
Glucose, Bld: 112 mg/dL — ABNORMAL HIGH (ref 70–99)
Potassium: 3.9 mmol/L (ref 3.5–5.1)
Sodium: 140 mmol/L (ref 135–145)
Total Bilirubin: 0.3 mg/dL (ref 0.3–1.2)
Total Protein: 7.7 g/dL (ref 6.5–8.1)

## 2018-06-29 LAB — ACETAMINOPHEN LEVEL: Acetaminophen (Tylenol), Serum: <10 ug/mL — ABNORMAL LOW (ref 10–30)

## 2018-06-29 LAB — SALICYLATE LEVEL: Salicylate Lvl: <7 mg/dL (ref 2.8–30.0)

## 2018-06-29 LAB — POCT PREGNANCY, URINE: Preg Test, Ur: NEGATIVE

## 2018-06-29 LAB — ETHANOL: Alcohol, Ethyl (B): 318 mg/dL (ref <10)

## 2018-06-29 MED ORDER — FENTANYL CITRATE (PF) 100 MCG/2ML IJ SOLN: 100.0000 ug | INTRAMUSCULAR | Status: DC | PRN

## 2018-06-29 MED ORDER — MIDAZOLAM HCL 2 MG/2ML IJ SOLN
INTRAMUSCULAR | Status: AC
  Filled 2018-06-29: qty 2

## 2018-06-29 MED ORDER — MIDAZOLAM 50MG/50ML (1MG/ML) PREMIX INFUSION: 1.0000 mg/h | INTRAVENOUS | Status: DC

## 2018-06-29 MED ORDER — FENTANYL CITRATE (PF) 100 MCG/2ML IJ SOLN
100.0000 ug | INTRAMUSCULAR | Status: DC | PRN
  Administered 2018-06-29 – 2018-06-30 (×2): 100 ug via INTRAVENOUS
  Filled 2018-06-29 (×2): qty 2

## 2018-06-29 MED ORDER — DEXMEDETOMIDINE HCL IN NACL 400 MCG/100ML IV SOLN
0.4000 ug/kg/h | INTRAVENOUS | Status: DC
  Administered 2018-06-29: 0.5 ug/kg/h via INTRAVENOUS
  Administered 2018-06-29: 0.4 ug/kg/h via INTRAVENOUS
  Filled 2018-06-29: qty 100

## 2018-06-29 MED ORDER — SODIUM CHLORIDE 0.9 % IV BOLUS
1000.0000 mL | Freq: Once | INTRAVENOUS | Status: AC
  Administered 2018-06-29: 1000 mL via INTRAVENOUS

## 2018-06-29 MED ORDER — ROCURONIUM BROMIDE 50 MG/5ML IV SOLN
100.0000 mg | Freq: Once | INTRAVENOUS | Status: AC
  Administered 2018-06-29: 100 mg via INTRAVENOUS
  Filled 2018-06-29: qty 10

## 2018-06-29 MED ORDER — DEXMEDETOMIDINE BOLUS VIA INFUSION
1.0000 ug/kg | Freq: Once | INTRAVENOUS | Status: AC
  Administered 2018-06-29: 102.1 ug via INTRAVENOUS
  Filled 2018-06-29: qty 103

## 2018-06-29 MED ORDER — SODIUM CHLORIDE 0.9 % IV SOLN
1.0000 mg/h | INTRAVENOUS | Status: DC
  Administered 2018-06-29: 1 mg/h via INTRAVENOUS
  Filled 2018-06-29: qty 10

## 2018-06-29 MED ORDER — SODIUM CHLORIDE 0.9 % IV SOLN
3.0000 g | Freq: Once | INTRAVENOUS | Status: AC
  Administered 2018-06-29: 3 g via INTRAVENOUS
  Filled 2018-06-29: qty 3

## 2018-06-29 MED ORDER — SODIUM CHLORIDE 0.9 % IV SOLN
INTRAVENOUS | Status: DC
  Administered 2018-06-29 – 2018-06-30 (×4): via INTRAVENOUS

## 2018-06-29 MED ORDER — MIDAZOLAM HCL 2 MG/2ML IJ SOLN
1.0000 mg | Freq: Once | INTRAMUSCULAR | Status: AC
  Administered 2018-06-29: 1 mg via INTRAVENOUS

## 2018-06-29 NOTE — Progress Notes (Signed)
eLink Physician-Brief Progress Note Patient Name: RENNEE COYNE DOB: 04-26-1970 MRN: [id number]   Date of Service  06/29/2018  HPI/Events of Note  48 yo female found down with empty bottle of Zoloft. UDS + for TCA's and ETOH also +. Intubated and ventilated. PCCM asked to assume care. VSS.  eICU Interventions  No new orders.      Intervention Category Evaluation Type: New Patient Evaluation  Lysle Dingwall 06/29/2018, 11:53 PM

## 2018-06-29 NOTE — ED Notes (Signed)
Pt has 16 Fr foley inserted at this time. 14 OG tube palced with biule return 55 @lip  ETT 7.5, 22 @lips 

## 2018-06-29 NOTE — ED Notes (Signed)
Date and time results received: 06/29/18 0746 (use smartphrase ".now" to insert current time)  Test: ETOH Critical Value: 318  Name of Provider Notified: Dr. Quentin Cornwall  Orders Received? Or Actions Taken?:

## 2018-06-29 NOTE — ED Triage Notes (Signed)
Pt presents to ED via ACMES for OD pt took UKA of benzo pt is none responsive on arrival

## 2018-06-29 NOTE — ED Notes (Signed)
Called floor to give report, nurse will call this RN back when free

## 2018-06-29 NOTE — ED Notes (Signed)
Husband at bedside stating pt lost her job 3 mths ago and had to sell their home. Husband cant confirm medications or whom MD is for pt. Husband states pt has been drinking excessively and stating " I just dont want to live"  EDP at bedside

## 2018-06-29 NOTE — ED Notes (Signed)
Called floor to give report for 2nd time, secretary stated that nurse would was going to pass meds and call me back.

## 2018-06-29 NOTE — H&P (Signed)
[name] [place] NAME: Lisa Vasquez    MR#:  [id number]  DATE OF BIRTH:  1970/08/14  DATE OF ADMISSION:  06/29/2018  PRIMARY CARE PHYSICIAN: Ronnell Freshwater, PA-C   REQUESTING/REFERRING PHYSICIAN: Quentin Cornwall, MD  CHIEF COMPLAINT:   Chief Complaint  Patient presents with  . Drug Overdose    HISTORY OF PRESENT ILLNESS:  Lisa Vasquez  is a 48 y.o. female who presents with chief complaint as above.  Patient brought to the ED by EMS for unresponsiveness and respiratory compromise.  She was found by her daughter at home unresponsive.  She was hypoxic, and had to be intubated in the ED.  Per report by her husband she recently lost her employment and they have been under financial stress since that time, patient has been drinking alcohol excessively since that time and making comments about not wanting to live.  Urine tox screen here in the ED is positive only for TCAs, and the patient had an active prescription for nortriptyline.  Her alcohol level is elevated.  Hospitalist were called for admission  PAST MEDICAL HISTORY:   Past Medical History:  Diagnosis Date  . Anemia   . Hypertension   . Skin cancer      PAST SURGICAL HISTORY:   Past Surgical History:  Procedure Laterality Date  . CERVICAL ABLATION    . EYE SURGERY    . NASAL SINUS SURGERY  2001  . STRABISMUS SURGERY Right   . WISDOM TOOTH EXTRACTION       SOCIAL HISTORY:   Social History   Tobacco Use  . Smoking status: Former Research scientist (life sciences)  . Smokeless tobacco: Never Used  Substance Use Topics  . Alcohol use: Yes     FAMILY HISTORY:   Family History  Problem Relation Age of Onset  . COPD Mother   . Skin cancer Mother   . Heart defect Mother   . Heart attack Maternal Aunt   . Heart attack Maternal Uncle   . Breast cancer Maternal Grandmother   . Melanoma Maternal Uncle      DRUG ALLERGIES:   Allergies  Allergen Reactions  . Ondansetron Other  (See Comments)    Constipation     MEDICATIONS AT HOME:   Prior to Admission medications   Medication Sig Start Date End Date Taking? Authorizing Provider  cyclobenzaprine (FLEXERIL) 10 MG tablet Take 10 mg by mouth daily. 12/18/17   [provider]  fluticasone (FLONASE) 50 MCG/ACT nasal spray PLACE 1 SPRAY IN EACH NOSTRIL D 09/01/16   [provider]  lisinopril (PRINIVIL,ZESTRIL) 10 MG tablet Take 10 mg by mouth daily. 11/18/17   [provider]  metoprolol succinate (TOPROL-XL) 25 MG 24 hr tablet Take 25 mg by mouth daily. 11/30/17   [provider]  nortriptyline (PAMELOR) 10 MG capsule TK 3 CS PO QD AT [facility] 09/19/16   [provider]  omeprazole (PRILOSEC) 40 MG capsule Take 1 capsule (40 mg total) by mouth 2 (two) times daily before a meal. 12/09/16 02/10/18  Vanga, Tally Due, MD  rizatriptan (MAXALT) 10 MG tablet Take 10 mg by mouth as needed for migraine. May repeat in 2 hours if needed    [provider]  sertraline (ZOLOFT) 100 MG tablet Take 200 mg by mouth daily.    [provider]  simvastatin (ZOCOR) 40 MG tablet Take 40 mg by mouth daily.    [provider]    REVIEW OF  SYSTEMS:  Review of Systems  Unable to perform ROS: Critical illness     VITAL SIGNS:   Vitals:   06/29/18 2030 06/29/18 2045 06/29/18 2130 06/29/18 2145  BP: 94/64 97/66 94/80  (!) 93/55  Pulse: 68  62   Resp: (!) 22 (!) 23 (!) 23 (!) 22  Temp:      TempSrc:      SpO2: 100%  100%   Weight:       Wt Readings from Last 3 Encounters:  06/29/18 102.1 kg  02/10/18 107 kg  03/25/17 103.6 kg    PHYSICAL EXAMINATION:  Physical Exam  Vitals reviewed. Constitutional: She is oriented to person, place, and time. She appears well-developed and well-nourished. No distress.  HENT:  Head: Normocephalic and atraumatic.  Mouth/Throat: Oropharynx is clear and moist.  Eyes: Pupils are equal, round, and reactive to light. Conjunctivae  and EOM are normal. No scleral icterus.  Neck: Normal range of motion. Neck supple. No JVD present. No thyromegaly present.  Cardiovascular: Normal rate, regular rhythm and intact distal pulses. Exam reveals no gallop and no friction rub.  No murmur heard. Respiratory: She is in respiratory distress (Intubated and mechanically ventilated). She has no wheezes.  Right upper lobe crackles  GI: Soft. Bowel sounds are normal. She exhibits no distension. There is no abdominal tenderness.  Musculoskeletal: Normal range of motion.        General: No edema.     Comments: No arthritis, no gout  Lymphadenopathy:    She has no cervical adenopathy.  Neurological: She is alert and oriented to person, place, and time. No cranial nerve deficit.  No dysarthria, no aphasia  Skin: Skin is warm and dry. No rash noted. No erythema.  Psychiatric: She has a normal mood and affect. Her behavior is normal. Judgment and thought content normal.    LABORATORY PANEL:   CBC Recent Labs  Lab 06/29/18 1858  WBC 12.4*  HGB 10.0*  HCT 34.4*  PLT 471*   ------------------------------------------------------------------------------------------------------------------  Chemistries  Recent Labs  Lab 06/29/18 1858  NA 140  K 3.9  CL 107  CO2 20*  GLUCOSE 112*  BUN 10  CREATININE 0.74  CALCIUM 8.3*  AST 99*  ALT 40  ALKPHOS 64  BILITOT 0.3   ------------------------------------------------------------------------------------------------------------------  Cardiac Enzymes No results for input(s): TROPONINI in the last 168 hours. ------------------------------------------------------------------------------------------------------------------  RADIOLOGY:  Ct Head Wo Contrast  Result Date: 06/29/2018 CLINICAL DATA:  Altered level of consciousness EXAM: CT HEAD WITHOUT CONTRAST TECHNIQUE: Contiguous axial images were obtained from the base of the skull through the vertex without intravenous contrast.  COMPARISON:  Report 11/04/2011 FINDINGS: Brain: No evidence of acute infarction, hemorrhage, hydrocephalus, extra-axial collection or mass lesion/mass effect. Vascular: No hyperdense vessel or unexpected calcification. Skull: Normal. Negative for fracture or focal lesion. Sinuses/Orbits: Mucosal thickening in the ethmoid sinuses Other: None IMPRESSION: Negative non contrasted CT appearance of the brain Electronically Signed   By: Donavan Foil M.D.   On: 06/29/2018 21:58   Dg Chest Portable 1 View  Result Date: 06/29/2018 CLINICAL DATA:  Worsening hypoxia. EXAM: PORTABLE CHEST 1 VIEW COMPARISON:  06/29/2018 FINDINGS: An endotracheal tube remains in place and terminates approximately 2 cm above the carina. An enteric tube has been placed and projects over the distal esophagus. The cardiomediastinal silhouette is unchanged with persistent bilateral hilar prominence. An enteric tube terminates just above the level of the left hemidiaphragm projecting over a moderately large hiatal hernia. No airspace consolidation, edema, pleural effusion,  pneumothorax is identified. IMPRESSION: 1. Support devices as above. 2. No evidence of acute airspace disease. Electronically Signed   By: Logan Bores M.D.   On: 06/29/2018 20:22   Dg Chest Port 1 View  Result Date: 06/29/2018 CLINICAL DATA:  Intubated EXAM: PORTABLE CHEST 1 VIEW COMPARISON:  05/31/2011 FINDINGS: Endotracheal tube tip is about 2 cm superior to the carina. No acute consolidation or pleural effusion. Stable cardiomediastinal silhouette with prominence of the left greater than right hila. Retrocardiac opacity, likely due to moderate hiatal hernia. No pneumothorax IMPRESSION: 1. Endotracheal tube tip about 2 cm superior to carina 2. No acute airspace disease Electronically Signed   By: Donavan Foil M.D.   On: 06/29/2018 19:32   Dg Abd Portable 1 View  Result Date: 06/29/2018 CLINICAL DATA:  Initial evaluation for OG tube placement. EXAM: PORTABLE ABDOMEN -  1 VIEW COMPARISON:  Prior radiograph from earlier same day. FINDINGS: Distal aspect of a probable enteric tube seen overlying the GE junction. Side hole not visualized, likely in the esophagus. Paucity of gas limits evaluation of the bowels. IMPRESSION: Tip of probable enteric tube overlying the GE junction. Side hole not visualize, likely in the distal esophagus. Electronically Signed   By: Jeannine Boga M.D.   On: 06/29/2018 20:04   Dg Abd Portable 1 View  Result Date: 06/29/2018 CLINICAL DATA:  OG tube placement EXAM: PORTABLE ABDOMEN - 1 VIEW COMPARISON:  None. FINDINGS: No radiopaque tubing visible over the lower mediastinum or left upper quadrant of the abdomen. IMPRESSION: No esophageal tube visualized over the lower chest or left upper quadrant. Electronically Signed   By: Donavan Foil M.D.   On: 06/29/2018 19:33    EKG:   Orders placed or performed during the hospital encounter of 06/29/18  . ED EKG  . ED EKG  . EKG 12-Lead  . EKG 12-Lead    IMPRESSION AND PLAN:  Principal Problem:   Acute respiratory failure with hypoxia (HCC) -due to alcohol intoxication plus whatever substance she may have taken.  She required intubation and mechanical ventilation.  Per ED physician there is some concern for possible aspiration event, though chest x-rays here in the ED did not show any signs of intrapulmonary abnormality.  Admit to ICU Active Problems:   Drug overdose -unclear what the patient may have taken.  Tox screen is only positive for TCAs for which she had an active prescription.  Patient will need psychiatry consult once she is able to be extubated   Alcoholic intoxication with complication ([place]) -currently intubated on sedation, she will need withdrawal protocols in place   HTN (hypertension) -blood pressure was initially low when she came to the ED, but responded to IV fluids.  Hold antihypertensives for now  Chart review performed and case discussed with ED provider. Labs,  imaging and/or ECG reviewed by provider and discussed with patient/family. Management plans discussed with the patient and/or family.  DVT PROPHYLAXIS: SubQ lovenox   GI PROPHYLAXIS:  H2 blocker  ADMISSION STATUS: Inpatient     CODE STATUS: Full  TOTAL CRITICAL CARE TIME TAKING CARE OF THIS PATIENT: 50 minutes.   Ethlyn Daniels 06/29/2018, 10:16 PM  [facility]  Office  [phone]  CC: Primary care physician; Ronnell Freshwater, PA-C  Note:  This document was prepared using Dragon voice recognition software and may include unintentional dictation errors.

## 2018-06-29 NOTE — ED Provider Notes (Signed)
[facility] Emergency Department Provider Note    First MD Initiated Contact with Patient 06/29/18 1857     (approximate)  I have reviewed the triage vital signs and the nursing notes.   HISTORY  Chief Complaint Drug Overdose  Level V Caveat:  overdose HPI Lisa Vasquez is a 48 y.o. female with presumed intentional overdose on uncertain amount of escitalopram and uncertain time.  Patient brought in with EMS altered and respiratory distress satting 80% on nonrebreather mask with NPA in place.  Blood sugar was normal.  Vital signs of otherwise been stable.  Patient obtunded.    Past Medical History:  Diagnosis Date  . Anemia   . Hypertension   . Skin cancer    Family History  Problem Relation Age of Onset  . COPD Mother   . Skin cancer Mother   . Heart defect Mother   . Heart attack Maternal Aunt   . Heart attack Maternal Uncle   . Breast cancer Maternal Grandmother   . Melanoma Maternal Uncle    Past Surgical History:  Procedure Laterality Date  . CERVICAL ABLATION    . EYE SURGERY    . NASAL SINUS SURGERY  2001  . STRABISMUS SURGERY Right   . WISDOM TOOTH EXTRACTION     Patient Active Problem List   Diagnosis Date Noted  . Drug overdose 06/29/2018  . Alcoholic intoxication with complication ([place]) 80/16/5537  . Acute respiratory failure with hypoxia ([place]) 06/29/2018  . HTN (hypertension) 06/29/2018  . Iron deficiency anemia 12/26/2015  . Low vitamin B12 level 12/26/2015  . Anemia 12/19/2015      Prior to Admission medications   Medication Sig Start Date End Date Taking? Authorizing Provider  cyclobenzaprine (FLEXERIL) 10 MG tablet Take 10 mg by mouth daily. 12/18/17   [provider]  fluticasone (FLONASE) 50 MCG/ACT nasal spray PLACE 1 SPRAY IN EACH NOSTRIL D 09/01/16   [provider]  lisinopril (PRINIVIL,ZESTRIL) 10 MG tablet Take 10 mg by mouth daily. 11/18/17   [provider]  metoprolol succinate  (TOPROL-XL) 25 MG 24 hr tablet Take 25 mg by mouth daily. 11/30/17   [provider]  nortriptyline (PAMELOR) 10 MG capsule TK 3 CS PO QD AT [facility] 09/19/16   [provider]  omeprazole (PRILOSEC) 40 MG capsule Take 1 capsule (40 mg total) by mouth 2 (two) times daily before a meal. 12/09/16 02/10/18  Vanga, Tally Due, MD  rizatriptan (MAXALT) 10 MG tablet Take 10 mg by mouth as needed for migraine. May repeat in 2 hours if needed    [provider]  sertraline (ZOLOFT) 100 MG tablet Take 200 mg by mouth daily.    [provider]  simvastatin (ZOCOR) 40 MG tablet Take 40 mg by mouth daily.    [provider]    Allergies Ondansetron    Social History Social History   Tobacco Use  . Smoking status: Former Research scientist (life sciences)  . Smokeless tobacco: Never Used  Substance Use Topics  . Alcohol use: Yes  . Drug use: Not on file    Review of Systems Patient denies headaches, rhinorrhea, blurry vision, numbness, shortness of breath, chest pain, edema, cough, abdominal pain, nausea, vomiting, diarrhea, dysuria, fevers, rashes or hallucinations unless otherwise stated above in HPI. ____________________________________________   PHYSICAL EXAM:  VITAL SIGNS: Vitals:   06/29/18 2200 06/29/18 2215  BP: 115/77 113/72  Pulse:    Resp: 20 18  Temp:    SpO2:  Constitutional: Obtunded.  Will moan to painful stimuli.  No posturing.  Very tachypneic and respiratory distress Eyes: Conjunctivae are normal.  Pupils midpoint Head: Atraumatic. Nose: No congestion/rhinnorhea. Mouth/Throat: Mucous membranes are moist.   Neck: No stridor. Painless ROM.  Cardiovascular: Tachycardic, regular rhythm. Grossly normal heart sounds.  Good peripheral circulation. Respiratory: Respiratory distress with hypoxic respiratory failure rhonchorous breath sounds heard in posterior bases gastrointestinal: Soft and nontender. No distention. No abdominal bruits. No CVA  tenderness. Genitourinary: deferred Musculoskeletal: No lower extremity tenderness nor edema.   Neurologic: Acute encephalopathy.  Unable to complete exam Skin:  Skin is warm, dry and intact. No rash noted. Psychiatric: Acute encephalopathy unable to evaluate. ____________________________________________   LABS (all labs ordered are listed, but only abnormal results are displayed)  Results for orders placed or performed during the hospital encounter of 06/29/18 (from the past 24 hour(s))  Comprehensive metabolic panel     Status: Abnormal   Collection Time: 06/29/18  6:58 PM  Result Value Ref Range   Sodium 140 135 - 145 mmol/L   Potassium 3.9 3.5 - 5.1 mmol/L   Chloride 107 98 - 111 mmol/L   CO2 20 (L) 22 - 32 mmol/L   Glucose, Bld 112 (H) 70 - 99 mg/dL   BUN 10 6 - 20 mg/dL   Creatinine, Ser 0.74 0.44 - 1.00 mg/dL   Calcium 8.3 (L) 8.9 - 10.3 mg/dL   Total Protein 7.7 6.5 - 8.1 g/dL   Albumin 4.1 3.5 - 5.0 g/dL   AST 99 (H) 15 - 41 U/L   ALT 40 0 - 44 U/L   Alkaline Phosphatase 64 38 - 126 U/L   Total Bilirubin 0.3 0.3 - 1.2 mg/dL   GFR calc non Af Amer >60 >60 mL/min   GFR calc Af Amer >60 >60 mL/min   Anion gap 13 5 - 15  Salicylate level     Status: None   Collection Time: 06/29/18  6:58 PM  Result Value Ref Range   Salicylate Lvl <2.1 2.8 - 30.0 mg/dL  Acetaminophen level     Status: Abnormal   Collection Time: 06/29/18  6:58 PM  Result Value Ref Range   Acetaminophen (Tylenol), Serum <10 (L) 10 - 30 ug/mL  Ethanol     Status: Abnormal   Collection Time: 06/29/18  6:58 PM  Result Value Ref Range   Alcohol, Ethyl (B) 318 (HH) <10 mg/dL  Urine Drug Screen, Qualitative     Status: Abnormal   Collection Time: 06/29/18  6:58 PM  Result Value Ref Range   Tricyclic, Ur Screen POSITIVE (A) NONE DETECTED   Amphetamines, Ur Screen NONE DETECTED NONE DETECTED   MDMA (Ecstasy)Ur Screen NONE DETECTED NONE DETECTED   Cocaine Metabolite,Ur [place] NONE DETECTED NONE DETECTED    Opiate, Ur Screen NONE DETECTED NONE DETECTED   Phencyclidine (PCP) Ur S NONE DETECTED NONE DETECTED   Cannabinoid 50 Ng, Ur  NONE DETECTED NONE DETECTED   Barbiturates, Ur Screen NONE DETECTED NONE DETECTED   Benzodiazepine, Ur Scrn NONE DETECTED NONE DETECTED   Methadone Scn, Ur NONE DETECTED NONE DETECTED  CBC WITH DIFFERENTIAL     Status: Abnormal   Collection Time: 06/29/18  6:58 PM  Result Value Ref Range   WBC 12.4 (H) 4.0 - 10.5 K/uL   RBC 4.29 3.87 - 5.11 MIL/uL   Hemoglobin 10.0 (L) 12.0 - 15.0 g/dL   HCT 34.4 (L) 36.0 - 46.0 %   MCV 80.2 80.0 - 100.0 fL  MCH 23.3 (L) 26.0 - 34.0 pg   MCHC 29.1 (L) 30.0 - 36.0 g/dL   RDW 15.9 (H) 11.5 - 15.5 %   Platelets 471 (H) 150 - 400 K/uL   nRBC 0.6 (H) 0.0 - 0.2 %   Neutrophils Relative % 40 %   Neutro Abs 5.0 1.7 - 7.7 K/uL   Lymphocytes Relative 37 %   Lymphs Abs 4.6 (H) 0.7 - 4.0 K/uL   Monocytes Relative 6 %   Monocytes Absolute 0.7 0.1 - 1.0 K/uL   Eosinophils Relative 14 %   Eosinophils Absolute 1.7 (H) 0.0 - 0.5 K/uL   Basophils Relative 2 %   Basophils Absolute 0.2 (H) 0.0 - 0.1 K/uL   Immature Granulocytes 1 %   Abs Immature Granulocytes 0.15 (H) 0.00 - 0.07 K/uL  Urinalysis, Complete w Microscopic     Status: Abnormal   Collection Time: 06/29/18  6:58 PM  Result Value Ref Range   Color, Urine STRAW (A) YELLOW   APPearance HAZY (A) CLEAR   Specific Gravity, Urine 1.005 1.005 - 1.030   pH 5.0 5.0 - 8.0   Glucose, UA NEGATIVE NEGATIVE mg/dL   Hgb urine dipstick NEGATIVE NEGATIVE   Bilirubin Urine NEGATIVE NEGATIVE   Ketones, ur NEGATIVE NEGATIVE mg/dL   Protein, ur NEGATIVE NEGATIVE mg/dL   Nitrite NEGATIVE NEGATIVE   Leukocytes,Ua NEGATIVE NEGATIVE   WBC, UA 0-5 0 - 5 WBC/hpf   Bacteria, UA NONE SEEN NONE SEEN   Squamous Epithelial / LPF NONE SEEN 0 - 5   Mucus PRESENT    Granular Casts, UA PRESENT    Amorphous Crystal PRESENT   Blood gas, venous     Status: Abnormal   Collection Time: 06/29/18  7:05 PM   Result Value Ref Range   pH, Ven 7.20 (L) 7.250 - 7.430   pCO2, Ven 55 44.0 - 60.0 mmHg   pO2, Ven 56.0 (H) 32.0 - 45.0 mmHg   Bicarbonate 21.5 20.0 - 28.0 mmol/L   Acid-base deficit 7.1 (H) 0.0 - 2.0 mmol/L   O2 Saturation 80.9 %   Patient temperature 37.0    Collection site VEIN    Sample type VEIN   Pregnancy, urine POC     Status: None   Collection Time: 06/29/18  7:56 PM  Result Value Ref Range   Preg Test, Ur NEGATIVE NEGATIVE   ____________________________________________  EKG My review and personal interpretation at Time: 19:00   Indication: od  Rate: 95  Rhythm: sinus Axis: normal Other: normal iuntervals, no stemi ____________________________________________  RADIOLOGY  I personally reviewed all radiographic images ordered to evaluate for the above acute complaints and reviewed radiology reports and findings.  These findings were personally discussed with the patient.  Please see medical record for radiology report.  ____________________________________________   PROCEDURES  Procedure(s) performed:  .Critical Care Performed by: Merlyn Lot, MD Authorized by: Merlyn Lot, MD   Critical care provider statement:    Critical care time (minutes):  30   Critical care time was exclusive of:  Separately billable procedures and treating other patients   Critical care was necessary to treat or prevent imminent or life-threatening deterioration of the following conditions:  Toxidrome and respiratory failure   Critical care was time spent personally by me on the following activities:  Development of treatment plan with patient or surrogate, discussions with consultants, evaluation of patient's response to treatment, examination of patient, obtaining history from patient or surrogate, ordering and performing treatments and interventions, ordering  and review of laboratory studies, ordering and review of radiographic studies, pulse oximetry, re-evaluation of  patient's condition and review of old charts Procedure Name: Intubation Date/Time: 06/29/2018 7:12 PM Performed by: Merlyn Lot, MD Pre-anesthesia Checklist: Patient identified, Patient being monitored, Emergency Drugs available, Timeout performed and Suction available Oxygen Delivery Method: Non-rebreather mask Preoxygenation: Pre-oxygenation with 100% oxygen Induction Type: Rapid sequence Ventilation: Mask ventilation without difficulty Laryngoscope Size: Glidescope and 3 Grade View: Grade I Tube size: 7.5 mm Number of attempts: 1 Airway Equipment and Method: Video-laryngoscopy Placement Confirmation: ETT inserted through vocal cords under direct vision,  CO2 detector and Breath sounds checked- equal and bilateral Secured at: 23 cm         Critical Care performed: yes ____________________________________________   INITIAL IMPRESSION / ASSESSMENT AND PLAN / ED COURSE  Pertinent labs & imaging results that were available during my care of the patient were reviewed by me and considered in my medical decision making (see chart for details).   DDX: Dehydration, sepsis, pna, uti, hypoglycemia, cva, drug effect, withdrawal, encephalitis   Lisa Vasquez is a 48 y.o. who presents to the ED with intentional overdose with acute respiratory failure and obtundation requiring emergent intubation as described above.  I have a high suspicion the patient has aspirated based on rhonchorous breath sounds and hypoxia on arrival.  EKG does not show any evidence of prolonged intervals.  Patient placed on cardiac monitor.  IV fluids started.  Blood work will be sent for the above differential.  Doubt infectious process.  Patient will be IVC due to concern for intentional overdose  Clinical Course as of Jun 29 2227  Tue Jun 29, 2018  1948 Patient's husband at bedside states he is unsure as to what she may have overdosed on but reports the last time he saw her normal was 3 days ago.  States  that she is a heavy drinker.   [PR]  2011 Patient had sudden desaturation while on the vent.  Has equal breath sounds bilaterally but has purulent frothy sputum in ET tube this is suctioned.  FiO2 was increased so was P.  Stat chest x-ray repeated.  Will add on Unasyn for presumed aspiration   [PR]  2026 Gust case in consultation with poison control.  No further recommendations at this point.  We will continue monitor for QT prolongation.  Certainly concern for seizure activity.  Will add on of Versed.   [PR]  2203 CT head does not show any evidence of acute bleed.  At this point I think she is stable and appropriate for admission of this ICU.  Patient has had episodes of hypotension improving with IV hydration.   [PR]    Clinical Course User Index [PR] Merlyn Lot, MD     As part of my medical decision making, I reviewed the following data within the [place] notes reviewed and incorporated, Labs reviewed, notes from prior ED visits and [place] Controlled Substance Database   ____________________________________________   FINAL CLINICAL IMPRESSION(S) / ED DIAGNOSES  Final diagnoses:  Suicide attempt ([place])  Acute respiratory failure with hypoxia ([place])  Polysubstance overdose, intentional self-harm, initial encounter ([place])      NEW MEDICATIONS STARTED DURING THIS VISIT:  New Prescriptions   No medications on file     Note:  This document was prepared using Dragon voice recognition software and may include unintentional dictation errors.    Merlyn Lot, MD 06/29/18 2229

## 2018-06-29 NOTE — ED Notes (Signed)
Pt earrings were placed in sterile cup and placed in pt bag

## 2018-06-29 NOTE — ED Notes (Signed)
Called floor to give report to the nurse but was transferred to charge nurse. This RN stated that I wanted to give report for the pt. Charge stated that they were busy. Lorriane Shire RN asked to speak to charge, this RN gave phone to [place].

## 2018-06-29 NOTE — ED Notes (Signed)
ED TO INPATIENT HANDOFF REPORT  ED Nurse Name and Phone #: Breonna Gafford 49   S Name/Age/Gender Lisa Vasquez 48 y.o. female Room/Bed: ED26A/ED26A  Code Status   Code Status: Not on file  Home/SNF/Other Home   Is this baseline? intubated  Triage Complete: Triage complete  Chief Complaint Overdose  Triage Note Pt presents to ED via [facility] for OD pt took UKA of benzo pt is none responsive on arrival    Allergies Allergies  Allergen Reactions  . Ondansetron Other (See Comments)    Constipation     Level of Care/Admitting Diagnosis ED Disposition    ED Disposition Condition [facility]: [place] [[id number]]  Level of Care: ICU [6]  Diagnosis: Acute respiratory failure with hypoxia [facility]) [[id number]]  Admitting Physician: Lance Coon [[id number]]  Attending Physician: Lance Coon [phone]  Estimated length of stay: past midnight tomorrow  Certification:: I certify this patient will need inpatient services for at least 2 midnights  PT Class (Do Not Modify): Inpatient [101]  PT Acc Code (Do Not Modify): Private [1]       B Medical/Surgery History Past Medical History:  Diagnosis Date  . Anemia   . Hypertension   . Skin cancer    Past Surgical History:  Procedure Laterality Date  . CERVICAL ABLATION    . EYE SURGERY    . NASAL SINUS SURGERY  2001  . STRABISMUS SURGERY Right   . WISDOM TOOTH EXTRACTION       A IV Location/Drains/Wounds Patient Lines/Drains/Airways Status   Active Line/Drains/Airways    Name:   Placement date:   Placement time:   Site:   Days:   Peripheral IV 06/29/18 Left Antecubital   06/29/18    1927    Antecubital   less than 1   Peripheral IV 06/29/18 Left Wrist   06/29/18    1928    Wrist   less than 1   Peripheral IV 06/29/18 Right Wrist   06/29/18    2035    Wrist   less than 1   Airway 7.5 mm   06/29/18    1900     less than 1          Intake/Output Last 24 hours  Intake/Output Summary  (Last 24 hours) at 06/29/2018 2228 Last data filed at 06/29/2018 2158 Gross per 24 hour  Intake 1000 ml  Output 650 ml  Net 350 ml    Labs/Imaging Results for orders placed or performed during the hospital encounter of 06/29/18 (from the past 48 hour(s))  Comprehensive metabolic panel     Status: Abnormal   Collection Time: 06/29/18  6:58 PM  Result Value Ref Range   Sodium 140 135 - 145 mmol/L   Potassium 3.9 3.5 - 5.1 mmol/L   Chloride 107 98 - 111 mmol/L   CO2 20 (L) 22 - 32 mmol/L   Glucose, Bld 112 (H) 70 - 99 mg/dL   BUN 10 6 - 20 mg/dL   Creatinine, Ser 0.74 0.44 - 1.00 mg/dL   Calcium 8.3 (L) 8.9 - 10.3 mg/dL   Total Protein 7.7 6.5 - 8.1 g/dL   Albumin 4.1 3.5 - 5.0 g/dL   AST 99 (H) 15 - 41 U/L   ALT 40 0 - 44 U/L   Alkaline Phosphatase 64 38 - 126 U/L   Total Bilirubin 0.3 0.3 - 1.2 mg/dL   GFR calc non Af Amer >60 >60 mL/min  GFR calc Af Amer >60 >60 mL/min   Anion gap 13 5 - 15    Comment: Performed at [facility], [address] [place], [place] [address]  Salicylate level     Status: None   Collection Time: 06/29/18  6:58 PM  Result Value Ref Range   Salicylate Lvl <6.3 2.8 - 30.0 mg/dL    Comment: Performed at [facility], [address] [place], [place] [address]  Acetaminophen level     Status: Abnormal   Collection Time: 06/29/18  6:58 PM  Result Value Ref Range   Acetaminophen (Tylenol), Serum <10 (L) 10 - 30 ug/mL    Comment: (NOTE) Therapeutic concentrations vary significantly. A range of 10-30 ug/mL  may be an effective concentration for many patients. However, some  are best treated at concentrations outside of this range. Acetaminophen concentrations >150 ug/mL at 4 hours after ingestion  and >50 ug/mL at 12 hours after ingestion are often associated with  toxic reactions. Performed at [facility], [address] [place], [place] [address]   Ethanol     Status: Abnormal   Collection Time: 06/29/18  6:58 PM   Result Value Ref Range   Alcohol, Ethyl (B) 318 (HH) <10 mg/dL    Comment: CRITICAL RESULT CALLED TO, READ BACK BY AND VERIFIED WITH KAYLAH KEENE @ 1946 ON 06/29/18 BY JUW (NOTE) Lowest detectable limit for serum alcohol is 10 mg/dL. For medical purposes only. Performed at [facility], [address] [place], [place] [address]   Urine Drug Screen, Qualitative     Status: Abnormal   Collection Time: 06/29/18  6:58 PM  Result Value Ref Range   Tricyclic, Ur Screen POSITIVE (A) NONE DETECTED   Amphetamines, Ur Screen NONE DETECTED NONE DETECTED   MDMA (Ecstasy)Ur Screen NONE DETECTED NONE DETECTED   Cocaine Metabolite,Ur [place] NONE DETECTED NONE DETECTED   Opiate, Ur Screen NONE DETECTED NONE DETECTED   Phencyclidine (PCP) Ur S NONE DETECTED NONE DETECTED   Cannabinoid 50 Ng, Ur [place] NONE DETECTED NONE DETECTED   Barbiturates, Ur Screen NONE DETECTED NONE DETECTED   Benzodiazepine, Ur Scrn NONE DETECTED NONE DETECTED   Methadone Scn, Ur NONE DETECTED NONE DETECTED    Comment: (NOTE) Tricyclics + metabolites, urine    Cutoff 1000 ng/mL Amphetamines + metabolites, urine  Cutoff 1000 ng/mL MDMA (Ecstasy), urine              Cutoff 500 ng/mL Cocaine Metabolite, urine          Cutoff 300 ng/mL Opiate + metabolites, urine        Cutoff 300 ng/mL Phencyclidine (PCP), urine         Cutoff 25 ng/mL Cannabinoid, urine                 Cutoff 50 ng/mL Barbiturates + metabolites, urine  Cutoff 200 ng/mL Benzodiazepine, urine              Cutoff 200 ng/mL Methadone, urine                   Cutoff 300 ng/mL The urine drug screen provides only a preliminary, unconfirmed analytical test result and should not be used for non-medical purposes. Clinical consideration and professional judgment should be applied to any positive drug screen result due to possible interfering substances. A more specific alternate chemical method must be used in order to obtain a confirmed analytical result. Gas  chromatography / mass spectrometry (GC/MS) is  the preferred confirmat ory method. Performed at [facility], [address] [place], [place] [address]   CBC WITH DIFFERENTIAL     Status: Abnormal   Collection Time: 06/29/18  6:58 PM  Result Value Ref Range   WBC 12.4 (H) 4.0 - 10.5 K/uL   RBC 4.29 3.87 - 5.11 MIL/uL   Hemoglobin 10.0 (L) 12.0 - 15.0 g/dL   HCT 34.4 (L) 36.0 - 46.0 %   MCV 80.2 80.0 - 100.0 fL   MCH 23.3 (L) 26.0 - 34.0 pg   MCHC 29.1 (L) 30.0 - 36.0 g/dL   RDW 15.9 (H) 11.5 - 15.5 %   Platelets 471 (H) 150 - 400 K/uL   nRBC 0.6 (H) 0.0 - 0.2 %   Neutrophils Relative % 40 %   Neutro Abs 5.0 1.7 - 7.7 K/uL   Lymphocytes Relative 37 %   Lymphs Abs 4.6 (H) 0.7 - 4.0 K/uL   Monocytes Relative 6 %   Monocytes Absolute 0.7 0.1 - 1.0 K/uL   Eosinophils Relative 14 %   Eosinophils Absolute 1.7 (H) 0.0 - 0.5 K/uL   Basophils Relative 2 %   Basophils Absolute 0.2 (H) 0.0 - 0.1 K/uL   Immature Granulocytes 1 %   Abs Immature Granulocytes 0.15 (H) 0.00 - 0.07 K/uL    Comment: Performed at [facility], [address] [place], [place] [address]  Urinalysis, Complete w Microscopic     Status: Abnormal   Collection Time: 06/29/18  6:58 PM  Result Value Ref Range   Color, Urine STRAW (A) YELLOW   APPearance HAZY (A) CLEAR   Specific Gravity, Urine 1.005 1.005 - 1.030   pH 5.0 5.0 - 8.0   Glucose, UA NEGATIVE NEGATIVE mg/dL   Hgb urine dipstick NEGATIVE NEGATIVE   Bilirubin Urine NEGATIVE NEGATIVE   Ketones, ur NEGATIVE NEGATIVE mg/dL   Protein, ur NEGATIVE NEGATIVE mg/dL   Nitrite NEGATIVE NEGATIVE   Leukocytes,Ua NEGATIVE NEGATIVE   WBC, UA 0-5 0 - 5 WBC/hpf   Bacteria, UA NONE SEEN NONE SEEN   Squamous Epithelial / LPF NONE SEEN 0 - 5   Mucus PRESENT    Granular Casts, UA PRESENT    Amorphous Crystal PRESENT     Comment: Performed at [facility], [address] [place], [place] [address]  Blood gas, venous     Status: Abnormal    Collection Time: 06/29/18  7:05 PM  Result Value Ref Range   pH, Ven 7.20 (L) 7.250 - 7.430   pCO2, Ven 55 44.0 - 60.0 mmHg   pO2, Ven 56.0 (H) 32.0 - 45.0 mmHg   Bicarbonate 21.5 20.0 - 28.0 mmol/L   Acid-base deficit 7.1 (H) 0.0 - 2.0 mmol/L   O2 Saturation 80.9 %   Patient temperature 37.0    Collection site VEIN    Sample type VEIN     Comment: Performed at [facility], [address] [place], [place] [address]  Pregnancy, urine POC     Status: None   Collection Time: 06/29/18  7:56 PM  Result Value Ref Range   Preg Test, Ur NEGATIVE NEGATIVE    Comment:        THE SENSITIVITY OF THIS METHODOLOGY IS >24 mIU/mL    Ct Head Wo Contrast  Result Date: 06/29/2018 CLINICAL DATA:  Altered level of consciousness EXAM: CT HEAD WITHOUT CONTRAST TECHNIQUE: Contiguous axial images were obtained from the base of the skull through the vertex without intravenous contrast.  COMPARISON:  Report 11/04/2011 FINDINGS: Brain: No evidence of acute infarction, hemorrhage, hydrocephalus, extra-axial collection or mass lesion/mass effect. Vascular: No hyperdense vessel or unexpected calcification. Skull: Normal. Negative for fracture or focal lesion. Sinuses/Orbits: Mucosal thickening in the ethmoid sinuses Other: None IMPRESSION: Negative non contrasted CT appearance of the brain Electronically Signed   By: Donavan Foil M.D.   On: 06/29/2018 21:58   Dg Chest Portable 1 View  Result Date: 06/29/2018 CLINICAL DATA:  Worsening hypoxia. EXAM: PORTABLE CHEST 1 VIEW COMPARISON:  06/29/2018 FINDINGS: An endotracheal tube remains in place and terminates approximately 2 cm above the carina. An enteric tube has been placed and projects over the distal esophagus. The cardiomediastinal silhouette is unchanged with persistent bilateral hilar prominence. An enteric tube terminates just above the level of the left hemidiaphragm projecting over a moderately large hiatal hernia. No airspace consolidation, edema,  pleural effusion, pneumothorax is identified. IMPRESSION: 1. Support devices as above. 2. No evidence of acute airspace disease. Electronically Signed   By: Logan Bores M.D.   On: 06/29/2018 20:22   Dg Chest Port 1 View  Result Date: 06/29/2018 CLINICAL DATA:  Intubated EXAM: PORTABLE CHEST 1 VIEW COMPARISON:  05/31/2011 FINDINGS: Endotracheal tube tip is about 2 cm superior to the carina. No acute consolidation or pleural effusion. Stable cardiomediastinal silhouette with prominence of the left greater than right hila. Retrocardiac opacity, likely due to moderate hiatal hernia. No pneumothorax IMPRESSION: 1. Endotracheal tube tip about 2 cm superior to carina 2. No acute airspace disease Electronically Signed   By: Donavan Foil M.D.   On: 06/29/2018 19:32   Dg Abd Portable 1 View  Result Date: 06/29/2018 CLINICAL DATA:  Initial evaluation for OG tube placement. EXAM: PORTABLE ABDOMEN - 1 VIEW COMPARISON:  Prior radiograph from earlier same day. FINDINGS: Distal aspect of a probable enteric tube seen overlying the GE junction. Side hole not visualized, likely in the esophagus. Paucity of gas limits evaluation of the bowels. IMPRESSION: Tip of probable enteric tube overlying the GE junction. Side hole not visualize, likely in the distal esophagus. Electronically Signed   By: Jeannine Boga M.D.   On: 06/29/2018 20:04   Dg Abd Portable 1 View  Result Date: 06/29/2018 CLINICAL DATA:  OG tube placement EXAM: PORTABLE ABDOMEN - 1 VIEW COMPARISON:  None. FINDINGS: No radiopaque tubing visible over the lower mediastinum or left upper quadrant of the abdomen. IMPRESSION: No esophageal tube visualized over the lower chest or left upper quadrant. Electronically Signed   By: Donavan Foil M.D.   On: 06/29/2018 19:33    Pending Labs Unresulted Labs (From admission, onward)    Start     Ordered   06/29/18 1854  Urine culture  ONCE - STAT,   STAT     06/29/18 1854   Signed and Held  HIV antibody  (Routine Testing)  Once,   R     Signed and Held   Signed and Held  CBC  (enoxaparin (LOVENOX)    CrCl >/= 30 ml/min)  Once,   R    Comments:  Baseline for enoxaparin therapy IF NOT ALREADY DRAWN.  Notify MD if PLT < 100 K.    Signed and Held   Signed and Held  Creatinine, serum  (enoxaparin (LOVENOX)    CrCl >/= 30 ml/min)  Once,   R    Comments:  Baseline for enoxaparin therapy IF NOT ALREADY DRAWN.    Signed and Held   Signed and Held  Creatinine, serum  (enoxaparin (LOVENOX)    CrCl >/= 30 ml/min)  Weekly,   R    Comments:  while on enoxaparin therapy    Signed and Held          Vitals/Pain Today's Vitals   06/29/18 2130 06/29/18 2145 06/29/18 2200 06/29/18 2215  BP: 94/80 (!) 93/55 115/77 113/72  Pulse: 62     Resp: (!) 23 (!) 22 20 18   Temp:      TempSrc:      SpO2: 100%     Weight:      PainSc:        Isolation Precautions No active isolations  Medications Medications  sodium chloride 0.9 % bolus 1,000 mL (0 mLs Intravenous Stopped 06/29/18 2106)    And  0.9 %  sodium chloride infusion ( Intravenous Stopped 06/29/18 2156)  dexmedetomidine (PRECEDEX) 400 MCG/100ML (4 mcg/mL) infusion (0.5 mcg/kg/hr  102.1 kg Intravenous Rate/Dose Verify 06/29/18 2015)  fentaNYL (SUBLIMAZE) injection 100 mcg (has no administration in time range)  midazolam (VERSED) 50 mg in sodium chloride 0.9 % 50 mL (1 mg/mL) infusion (1 mg/hr Intravenous New Bag/Given 06/29/18 2103)  dexmedetomidine (PRECEDEX) bolus via infusion 102.1 mcg (102.1 mcg Intravenous Bolus from Bag 06/29/18 1948)  Ampicillin-Sulbactam (UNASYN) 3 g in sodium chloride 0.9 % 100 mL IVPB (0 g Intravenous Stopped 06/29/18 2104)  midazolam (VERSED) injection 1 mg (1 mg Intravenous Given 06/29/18 2019)  rocuronium (ZEMURON) injection 100 mg (100 mg Intravenous Given 06/29/18 2132)  sodium chloride 0.9 % bolus 1,000 mL (0 mLs Intravenous Stopped 06/29/18 2158)    Mobility      Focused Assessments Pulmonary Assessment  Handoff:  Lung sounds:   O2 Device: Bag-valve Mask        R Recommendations: See Admitting Provider Note  Report given to:   Additional Notes:

## 2018-06-30 ENCOUNTER — Inpatient Hospital Stay: Payer: BLUE CROSS/BLUE SHIELD

## 2018-06-30 ENCOUNTER — Other Ambulatory Visit: Payer: Self-pay

## 2018-06-30 DIAGNOSIS — T50902D Poisoning by unspecified drugs, medicaments and biological substances, intentional self-harm, subsequent encounter: Secondary | ICD-10-CM

## 2018-06-30 DIAGNOSIS — J9601 Acute respiratory failure with hypoxia: Secondary | ICD-10-CM

## 2018-06-30 DIAGNOSIS — T50902A Poisoning by unspecified drugs, medicaments and biological substances, intentional self-harm, initial encounter: Secondary | ICD-10-CM

## 2018-06-30 DIAGNOSIS — F10929 Alcohol use, unspecified with intoxication, unspecified: Secondary | ICD-10-CM

## 2018-06-30 LAB — COMPREHENSIVE METABOLIC PANEL
ALT: 37 U/L (ref 0–44)
AST: 83 U/L — ABNORMAL HIGH (ref 15–41)
Albumin: 3 g/dL — ABNORMAL LOW (ref 3.5–5.0)
Alkaline Phosphatase: 46 U/L (ref 38–126)
Anion gap: 8 (ref 5–15)
BUN: 11 mg/dL (ref 6–20)
CO2: 18 mmol/L — ABNORMAL LOW (ref 22–32)
Calcium: 6.8 mg/dL — ABNORMAL LOW (ref 8.9–10.3)
Chloride: 117 mmol/L — ABNORMAL HIGH (ref 98–111)
Creatinine, Ser: 0.89 mg/dL (ref 0.44–1.00)
GFR calc Af Amer: >60 mL/min (ref >60)
GFR calc non Af Amer: >60 mL/min (ref >60)
Glucose, Bld: 106 mg/dL — ABNORMAL HIGH (ref 70–99)
Potassium: 3.5 mmol/L (ref 3.5–5.1)
Sodium: 143 mmol/L (ref 135–145)
Total Bilirubin: 0.5 mg/dL (ref 0.3–1.2)
Total Protein: 5.7 g/dL — ABNORMAL LOW (ref 6.5–8.1)

## 2018-06-30 LAB — BLOOD GAS, ARTERIAL
Acid-base deficit: 9.8 mmol/L — ABNORMAL HIGH (ref 0.0–2.0)
Bicarbonate: 14.9 mmol/L — ABNORMAL LOW (ref 20.0–28.0)
FIO2: 0.5
MECHVT: 500 mL
O2 Saturation: 94.2 %
PEEP: 5 cmH2O
Patient temperature: 37
RATE: 16 resp/min
pCO2 arterial: 29 mmHg — ABNORMAL LOW (ref 32.0–48.0)
pH, Arterial: 7.32 — ABNORMAL LOW (ref 7.350–7.450)
pO2, Arterial: 78 mmHg — ABNORMAL LOW (ref 83.0–108.0)

## 2018-06-30 LAB — CBC
HCT: 26.4 % — ABNORMAL LOW (ref 36.0–46.0)
Hemoglobin: 7.6 g/dL — ABNORMAL LOW (ref 12.0–15.0)
MCH: 23.8 pg — ABNORMAL LOW (ref 26.0–34.0)
MCHC: 28.8 g/dL — ABNORMAL LOW (ref 30.0–36.0)
MCV: 82.8 fL (ref 80.0–100.0)
Platelets: 260 10*3/uL (ref 150–400)
RBC: 3.19 MIL/uL — ABNORMAL LOW (ref 3.87–5.11)
RDW: 15.7 % — ABNORMAL HIGH (ref 11.5–15.5)
WBC: 11.9 10*3/uL — ABNORMAL HIGH (ref 4.0–10.5)
nRBC: 0.3 % — ABNORMAL HIGH (ref 0.0–0.2)

## 2018-06-30 LAB — MRSA PCR SCREENING: MRSA by PCR: NEGATIVE

## 2018-06-30 LAB — GLUCOSE, CAPILLARY
Glucose-Capillary: 79 mg/dL (ref 70–99)
Glucose-Capillary: 83 mg/dL (ref 70–99)
Glucose-Capillary: 97 mg/dL (ref 70–99)
Glucose-Capillary: 81 mg/dL (ref 70–99)
Glucose-Capillary: 104 mg/dL — ABNORMAL HIGH (ref 70–99)

## 2018-06-30 LAB — PROCALCITONIN: Procalcitonin: 0.12 ng/mL

## 2018-06-30 LAB — TRIGLYCERIDES: Triglycerides: 140 mg/dL (ref <150)

## 2018-06-30 MED ORDER — IPRATROPIUM-ALBUTEROL 0.5-2.5 (3) MG/3ML IN SOLN
3.0000 mL | RESPIRATORY_TRACT | Status: DC | PRN
  Administered 2018-07-02: 3 mL via RESPIRATORY_TRACT
  Filled 2018-06-30: qty 3

## 2018-06-30 MED ORDER — ENOXAPARIN SODIUM 40 MG/0.4ML ~~LOC~~ SOLN
40.0000 mg | SUBCUTANEOUS | Status: DC
  Administered 2018-06-30: 40 mg via SUBCUTANEOUS
  Filled 2018-06-30: qty 0.4

## 2018-06-30 MED ORDER — PHENOL 1.4 % MT LIQD
1.0000 | OROMUCOSAL | Status: DC | PRN
  Filled 2018-06-30: qty 177

## 2018-06-30 MED ORDER — ACETAMINOPHEN 325 MG PO TABS
650.0000 mg | ORAL_TABLET | ORAL | Status: DC | PRN
  Filled 2018-06-30: qty 2

## 2018-06-30 MED ORDER — MIDAZOLAM HCL 2 MG/2ML IJ SOLN
2.0000 mg | INTRAMUSCULAR | Status: DC | PRN
  Filled 2018-06-30: qty 2

## 2018-06-30 MED ORDER — CHLORHEXIDINE GLUCONATE CLOTH 2 % EX PADS
6.0000 | MEDICATED_PAD | Freq: Every day | CUTANEOUS | Status: DC
  Administered 2018-06-30: 6 via TOPICAL

## 2018-06-30 MED ORDER — DEXTROSE IN LACTATED RINGERS 5 % IV SOLN
INTRAVENOUS | Status: AC
  Administered 2018-06-30: 11:00:00 via INTRAVENOUS

## 2018-06-30 MED ORDER — LURASIDONE HCL 40 MG PO TABS
40.0000 mg | ORAL_TABLET | Freq: Every day | ORAL | Status: DC
  Administered 2018-07-01 – 2018-07-02 (×2): 40 mg via ORAL
  Filled 2018-06-30 (×2): qty 1

## 2018-06-30 MED ORDER — FENTANYL 2500MCG IN NS 250ML (10MCG/ML) PREMIX INFUSION
0.0000 ug/h | INTRAVENOUS | Status: DC
  Administered 2018-06-30: 25 ug/h via INTRAVENOUS
  Filled 2018-06-30: qty 250

## 2018-06-30 MED ORDER — INSULIN ASPART 100 UNIT/ML ~~LOC~~ SOLN: 0.0000 [IU] | SUBCUTANEOUS | Status: DC

## 2018-06-30 MED ORDER — FAMOTIDINE IN NACL 20-0.9 MG/50ML-% IV SOLN
20.0000 mg | Freq: Two times a day (BID) | INTRAVENOUS | Status: DC
  Administered 2018-06-30: 20 mg via INTRAVENOUS
  Filled 2018-06-30: qty 50

## 2018-06-30 MED ORDER — ORAL CARE MOUTH RINSE
15.0000 mL | OROMUCOSAL | Status: DC
  Administered 2018-06-30 (×4): 15 mL via OROMUCOSAL

## 2018-06-30 MED ORDER — MIDAZOLAM HCL 2 MG/2ML IJ SOLN: 2.0000 mg | INTRAMUSCULAR | Status: DC | PRN

## 2018-06-30 MED ORDER — LURASIDONE HCL 40 MG PO TABS
40.0000 mg | ORAL_TABLET | Freq: Once | ORAL | Status: AC
  Administered 2018-06-30: 40 mg via ORAL
  Filled 2018-06-30: qty 1

## 2018-06-30 MED ORDER — LACTATED RINGERS IV BOLUS
1000.0000 mL | Freq: Once | INTRAVENOUS | Status: AC
  Administered 2018-06-30: 1000 mL via INTRAVENOUS

## 2018-06-30 MED ORDER — ACETAMINOPHEN 325 MG PO TABS
650.0000 mg | ORAL_TABLET | Freq: Four times a day (QID) | ORAL | Status: DC | PRN
  Administered 2018-06-30 – 2018-07-02 (×5): 650 mg via ORAL
  Filled 2018-06-30 (×5): qty 2

## 2018-06-30 MED ORDER — ENOXAPARIN SODIUM 40 MG/0.4ML ~~LOC~~ SOLN
40.0000 mg | SUBCUTANEOUS | Status: DC
  Administered 2018-07-01: 40 mg via SUBCUTANEOUS
  Filled 2018-06-30: qty 0.4

## 2018-06-30 MED ORDER — PROPOFOL 1000 MG/100ML IV EMUL
5.0000 ug/kg/min | INTRAVENOUS | Status: DC
  Administered 2018-06-30: 5 ug/kg/min via INTRAVENOUS
  Administered 2018-06-30: 60 ug/kg/min via INTRAVENOUS
  Filled 2018-06-30 (×3): qty 100

## 2018-06-30 MED ORDER — SODIUM CHLORIDE 0.9 % IV SOLN
3.0000 g | Freq: Four times a day (QID) | INTRAVENOUS | Status: DC
  Administered 2018-06-30 – 2018-07-01 (×6): 3 g via INTRAVENOUS
  Filled 2018-06-30 (×9): qty 3

## 2018-06-30 MED ORDER — FENTANYL CITRATE (PF) 100 MCG/2ML IJ SOLN
100.0000 ug | INTRAMUSCULAR | Status: DC | PRN
  Administered 2018-06-30: 100 ug via INTRAVENOUS

## 2018-06-30 MED ORDER — ACETAMINOPHEN 650 MG RE SUPP
650.0000 mg | RECTAL | Status: DC | PRN
  Administered 2018-06-30: 650 mg via RECTAL
  Filled 2018-06-30: qty 1

## 2018-06-30 MED ORDER — SODIUM BICARBONATE 8.4 % IV SOLN
INTRAVENOUS | Status: DC
  Administered 2018-06-30: 05:00:00 via INTRAVENOUS
  Filled 2018-06-30 (×2): qty 150

## 2018-06-30 MED ORDER — FENTANYL CITRATE (PF) 100 MCG/2ML IJ SOLN: 100.0000 ug | INTRAMUSCULAR | Status: DC | PRN

## 2018-06-30 MED ORDER — CHLORHEXIDINE GLUCONATE 0.12% ORAL RINSE (MEDLINE KIT)
15.0000 mL | Freq: Two times a day (BID) | OROMUCOSAL | Status: DC
  Administered 2018-06-30 (×2): 15 mL via OROMUCOSAL

## 2018-06-30 NOTE — Progress Notes (Addendum)
Per MD verbal order, okay to titrate patient back to PEEP 5 and FIO2 weaned to 60%, SAT at 100%

## 2018-06-30 NOTE — Progress Notes (Signed)
Patient FIO2 increased from 50% to 100% and Peep increased from 5 to 6, per MD

## 2018-06-30 NOTE — Progress Notes (Signed)
Patient passed SBT this morning.  She was extubated under my supervision.  She has done well with.  I have placed an order for transfer to [place] floor.  She needs a Designer, fashion/clothing.  After transfer, PCCM will sign off. Please call if we can be of further assistance    Merton Border, MD PCCM service Mobile [phone] Pager [phone] 06/30/2018 5:05 PM

## 2018-06-30 NOTE — Progress Notes (Signed)
Escitalopram is prescribed to [facility]. Pharmacy notified. Pharmacy will pick up.

## 2018-06-30 NOTE — Progress Notes (Signed)
Patient transported on ventilator to ICU room, without complication. Report given to ICU RT.

## 2018-06-30 NOTE — Consult Note (Addendum)
PULMONARY / CRITICAL CARE MEDICINE   Name: Lisa Vasquez MRN: [id number] DOB: [birth year]    ADMISSION DATE:  06/29/2018 CONSULTATION DATE:  06/29/2018  REFERRING MD:  Dr. Jannifer Franklin  CHIEF COMPLAINT:  Drug Overdose (suicide attempt)  BRIEF DISCUSSION: 48 year old female admitted with intentional drug overdose/suicide attempt requiring intubation in the ED. Unclear as to what exactly she took, but she does have prescription for Nortriptyline and urine drug screen is positive for tricyclics. Ethyl alcohol was 318.  Concern for possible aspiration pneumonia.  HISTORY OF PRESENT ILLNESS:   Lisa Vasquez is a 48 year old female with a past medical history of anemia and hypertension presents to [facility] ED on 06/29/2018 after being found unresponsive at home by her daughter.  Upon EMS arrival she was noted to be obtunded, in respiratory distress and hypoxic.  She required intubation in the ED. While in the ED she had an acute episode of hypoxia requiring increased FiO2, which was attributed to presumed aspiration.  Patient is currently intubated and sedated, and no family is present, therefore history is obtained from ED and nursing notes.  Per notes it is reported that she recently lost her employment and her family has been under financial stress since then.  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  It is unclear as to exactly what she took, but she does have an active prescription for nortriptyline. Initial work-up in the ED revealed ethyl alcohol 318, Tylenol less than 10, salicylates less than 7, urine drug screen positive for tricyclics, urinalysis negative, WBC 12.4, hemoglobin 10.  Chest x-ray with no acute disease process.  She is admitted to [facility] ICU for treatment of acute hypoxic respiratory failure in the setting of drug overdose/suicide attempt and presumed aspiration pneumonia.  PCCM is consulted for further  management.  PAST MEDICAL HISTORY :  She  has a past medical history of Anemia, Hypertension, and Skin cancer.  PAST SURGICAL HISTORY: She  has a past surgical history that includes Eye surgery; Strabismus surgery (Right); Wisdom tooth extraction; Cervical ablation; and Nasal sinus surgery (2001).  Allergies  Allergen Reactions  . Ondansetron Other (See Comments)    Constipation     No current facility-administered medications on file prior to encounter.    Current Outpatient Medications on File Prior to Encounter  Medication Sig  . cyclobenzaprine (FLEXERIL) 10 MG tablet Take 10 mg by mouth daily.  . fluticasone (FLONASE) 50 MCG/ACT nasal spray PLACE 1 SPRAY IN EACH NOSTRIL D  . lisinopril (PRINIVIL,ZESTRIL) 10 MG tablet Take 10 mg by mouth daily.  . metoprolol succinate (TOPROL-XL) 25 MG 24 hr tablet Take 25 mg by mouth daily.  . nortriptyline (PAMELOR) 10 MG capsule TK 3 CS PO QD AT DINNER  . omeprazole (PRILOSEC) 40 MG capsule Take 1 capsule (40 mg total) by mouth 2 (two) times daily before a meal.  . rizatriptan (MAXALT) 10 MG tablet Take 10 mg by mouth as needed for migraine. May repeat in 2 hours if needed  . sertraline (ZOLOFT) 100 MG tablet Take 200 mg by mouth daily.  . simvastatin (ZOCOR) 40 MG tablet Take 40 mg by mouth daily.    FAMILY HISTORY:  Her She indicated that her mother is alive. She indicated that her maternal grandmother is deceased. She indicated that her maternal aunt is deceased. She indicated that both of her maternal uncles are deceased.   SOCIAL HISTORY: She  reports that  she has quit smoking. She has never used smokeless tobacco. She reports current alcohol use.  REVIEW OF SYSTEMS:   Unable to obtain due to intubation and sedation  SUBJECTIVE:  Unable to obtain due to intubation and sedation  VITAL SIGNS: BP 113/77 (BP Location: Left Arm)   Pulse 85   Temp 97.9 F (36.6 C) (Oral)   Resp (!) 25   Ht 5' 6" (1.676 m)   Wt 105.5 kg   SpO2  95%   BMI 37.54 kg/m   HEMODYNAMICS:    VENTILATOR SETTINGS: Vent Mode: AC FiO2 (%):  [50 %-100 %] 50 % Set Rate:  [16 bmp] 16 bmp Vt Set:  [500 mL] 500 mL PEEP:  [5 cmH20-6 cmH20] 5 cmH20  INTAKE / OUTPUT: No intake/output data recorded.  PHYSICAL EXAMINATION: General: Acutely ill-appearing female, laying in bed, intubated and sedated, in no acute distress Neuro: Sedated, withdrawals from pain, pupils PERRLA 3 mm bilateral HEENT: Atraumatic, normocephalic, neck supple, no JVD Cardiovascular: RRR, S1-S2, no murmurs rubs or gallops, 1+ pulses throughout Lungs: Coarse rhonchi upon auscultation throughout, vent assisted, even, nonlabored Abdomen: Obese, soft, nontender, nondistended, no guarding or rebound tenderness, sounds positive x4 Musculoskeletal: Normal bulk and tone, no deformities, no edema Skin: Warm and dry, no obvious rashes lesions or ulcerations  LABS:  BMET Recent Labs  Lab 06/29/18 1858  NA 140  K 3.9  CL 107  CO2 20*  BUN 10  CREATININE 0.74  GLUCOSE 112*    Electrolytes Recent Labs  Lab 06/29/18 1858  CALCIUM 8.3*    CBC Recent Labs  Lab 06/29/18 1858  WBC 12.4*  HGB 10.0*  HCT 34.4*  PLT 471*    Coag's No results for input(s): APTT, INR in the last 168 hours.  Sepsis Markers No results for input(s): LATICACIDVEN, PROCALCITON, O2SATVEN in the last 168 hours.  ABG No results for input(s): PHART, PCO2ART, PO2ART in the last 168 hours.  Liver Enzymes Recent Labs  Lab 06/29/18 1858  AST 99*  ALT 40  ALKPHOS 64  BILITOT 0.3  ALBUMIN 4.1    Cardiac Enzymes No results for input(s): TROPONINI, PROBNP in the last 168 hours.  Glucose No results for input(s): GLUCAP in the last 168 hours.  Imaging Ct Head Wo Contrast  Result Date: 06/29/2018 CLINICAL DATA:  Altered level of consciousness EXAM: CT HEAD WITHOUT CONTRAST TECHNIQUE: Contiguous axial images were obtained from the base of the skull through the vertex without  intravenous contrast. COMPARISON:  Report 11/04/2011 FINDINGS: Brain: No evidence of acute infarction, hemorrhage, hydrocephalus, extra-axial collection or mass lesion/mass effect. Vascular: No hyperdense vessel or unexpected calcification. Skull: Normal. Negative for fracture or focal lesion. Sinuses/Orbits: Mucosal thickening in the ethmoid sinuses Other: None IMPRESSION: Negative non contrasted CT appearance of the brain Electronically Signed   By: Donavan Foil M.D.   On: 06/29/2018 21:58   Dg Chest Portable 1 View  Result Date: 06/29/2018 CLINICAL DATA:  Worsening hypoxia. EXAM: PORTABLE CHEST 1 VIEW COMPARISON:  06/29/2018 FINDINGS: An endotracheal tube remains in place and terminates approximately 2 cm above the carina. An enteric tube has been placed and projects over the distal esophagus. The cardiomediastinal silhouette is unchanged with persistent bilateral hilar prominence. An enteric tube terminates just above the level of the left hemidiaphragm projecting over a moderately large hiatal hernia. No airspace consolidation, edema, pleural effusion, pneumothorax is identified. IMPRESSION: 1. Support devices as above. 2. No evidence of acute airspace disease. Electronically Signed   By: Zenia Resides  Jeralyn Ruths M.D.   On: 06/29/2018 20:22   Dg Chest Port 1 View  Result Date: 06/29/2018 CLINICAL DATA:  Intubated EXAM: PORTABLE CHEST 1 VIEW COMPARISON:  05/31/2011 FINDINGS: Endotracheal tube tip is about 2 cm superior to the carina. No acute consolidation or pleural effusion. Stable cardiomediastinal silhouette with prominence of the left greater than right hila. Retrocardiac opacity, likely due to moderate hiatal hernia. No pneumothorax IMPRESSION: 1. Endotracheal tube tip about 2 cm superior to carina 2. No acute airspace disease Electronically Signed   By: Donavan Foil M.D.   On: 06/29/2018 19:32   Dg Abd Portable 1 View  Result Date: 06/29/2018 CLINICAL DATA:  Initial evaluation for OG tube placement.  EXAM: PORTABLE ABDOMEN - 1 VIEW COMPARISON:  Prior radiograph from earlier same day. FINDINGS: Distal aspect of a probable enteric tube seen overlying the GE junction. Side hole not visualized, likely in the esophagus. Paucity of gas limits evaluation of the bowels. IMPRESSION: Tip of probable enteric tube overlying the GE junction. Side hole not visualize, likely in the distal esophagus. Electronically Signed   By: Jeannine Boga M.D.   On: 06/29/2018 20:04   Dg Abd Portable 1 View  Result Date: 06/29/2018 CLINICAL DATA:  OG tube placement EXAM: PORTABLE ABDOMEN - 1 VIEW COMPARISON:  None. FINDINGS: No radiopaque tubing visible over the lower mediastinum or left upper quadrant of the abdomen. IMPRESSION: No esophageal tube visualized over the lower chest or left upper quadrant. Electronically Signed   By: Donavan Foil M.D.   On: 06/29/2018 19:33     STUDIES:   CULTURES: Urine 3/10>> Sputum 3/11>> Blood x2  3/11>> MRSA PCR 3/10>>Negative  ANTIBIOTICS: Unasyn 3/11>>  SIGNIFICANT EVENTS: 3/11>> Intubated in ED.  Admitted to [facility] ICU  LINES/TUBES: ETT 3/11>>   ASSESSMENT / PLAN:  PULMONARY A: Acute hypoxic respiratory failure in setting of drug overdose and suspected aspiration pneumonia P:   Full vent support, PRVC: 8 cc/kg Wean FiO2 and PEEP as tolerated Follow intermittent ABG and chest x-ray VAP bundle Spontaneous breathing trials when parameters met and mental status permits Obtain sputum culture Placed on empiric Unasyn for now  CARDIOVASCULAR A:  No acute issues P:  Cardiac monitoring Maintain MAP> 65 Continue IV fluids Follow EKG to assess QTc interval  RENAL A:   No acute issues P:   Monitor I&O's / urinary output Follow BMP Ensure adequate renal perfusion Avoid nephrotoxic agents as able Replace electrolytes as indicated   GASTROINTESTINAL A:   No acute issues P:   N.p.o. Pepcid for stress ulcer prophylaxis  HEMATOLOGIC A:   Anemia  without signs of active bleeding P:  Monitor for S/Sx of bleeding Trend CBC Lovenox for VTE Prophylaxis  Transfuse for Hgb <7  INFECTIOUS A:   Questionable aspiration pneumonia P:   Monitor fever curve Trend WBCs and procalcitonin Follow sputum culture Place on empiric Unasyn for now   ENDOCRINE A:   Hyperglycemia P:   CBGs SSI Follow ICU hypo/hyperglycemia protocol  NEUROLOGIC A:   Intentional drug overdose (suicide attempt) P:   RASS goal: 0 to -1 Versed and Precedex infusions to maintain RASS goal Avoid sedating meds as able Monitor for seizures Daily wake-up assessment Provide supportive care Involuntary commitment Psych consulted, appreciate input Poison control following, appreciate input   FAMILY  - Updates: No family present at bedside 06/30/18 for update.  Pt is a full code.  - Inter-disciplinary family meet or Palliative Care meeting due by:  07/07/2018  Darel Hong, AGACNP-BC [facility] Pager: [phone] Cell: [phone]  06/30/2018, 1:12 AM

## 2018-06-30 NOTE — Progress Notes (Signed)
Pt was suctioned prior to extubation for no secretions. Per Dr. Alva Garnet order, she was extubated without incident. Sa02 are 94% so no oxygen was needed at this time. She is voicing and coughing.

## 2018-06-30 NOTE — Progress Notes (Signed)
Escitalopram 10mg  found with only two tabs left inside in patient belongings bag. Patient stated "that's what I took."

## 2018-06-30 NOTE — Progress Notes (Signed)
21:24 Poison control / Janett Billow called pt's assessment,  vital sign , EKG result was given to her.   2130 Pt transferred to 207 report given to [place], [place]

## 2018-06-30 NOTE — Progress Notes (Signed)
Patient claims she took 15 tabs of clonazepam with alcohol.

## 2018-06-30 NOTE — Progress Notes (Addendum)
Poison control recommends a 24hr observation/cardiac monitoring from the time of ingestion with a repeat EKG at that point.

## 2018-06-30 NOTE — Progress Notes (Signed)
Patient transported on ventilator to CT and back to ED room, without complication
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
## 2018-06-30 NOTE — Progress Notes (Signed)
Pharmacy Antibiotic Note  Lisa Vasquez is a 48 y.o. female admitted on 06/29/2018 with aspiration pneumonia.  Pharmacy has been consulted for Unasyn dosing.  Plan: Patient received Unasyn 3g IV x 1  Will continue Unasyn 3g IV q6h  Height: 5\' 6"  (167.6 cm) Weight: 232 lb 9.4 oz (105.5 kg) IBW/kg (Calculated) : 59.3  Temp (24hrs), Avg:98.3 F (36.8 C), Min:97.9 F (36.6 C), Max:98.7 F (37.1 C)  Recent Labs  Lab 06/29/18 1858  WBC 12.4*  CREATININE 0.74    Estimated Creatinine Clearance: 104.5 mL/min (by C-G formula based on SCr of 0.74 mg/dL).    Allergies  Allergen Reactions  . Ondansetron Other (See Comments)    Constipation     Thank you for allowing pharmacy to be a part of this patient's care.  Tobie Lords, PharmD, BCPS Clinical Pharmacist 06/30/2018

## 2018-06-30 NOTE — Progress Notes (Signed)
[facility] NAME: Lisa Vasquez    MR#:  [id number]  DATE OF BIRTH:  19-Dec-1970  SUBJECTIVE:  CHIEF COMPLAINT:   Chief Complaint  Patient presents with  . Drug Overdose   -Came in with an overdose, depressed still. -Sitter at bedside.  Denies any complaints.  REVIEW OF SYSTEMS:  Review of Systems  Constitutional: Positive for malaise/fatigue. Negative for chills and fever.  HENT: Negative for hearing loss.   Eyes: Negative for blurred vision and double vision.  Respiratory: Negative for cough, shortness of breath and wheezing.   Cardiovascular: Negative for chest pain, palpitations and leg swelling.  Gastrointestinal: Negative for abdominal pain, constipation, diarrhea, nausea and vomiting.  Genitourinary: Negative for dysuria and hematuria.  Musculoskeletal: Negative for myalgias.  Neurological: Negative for dizziness, focal weakness, seizures, weakness and headaches.  Psychiatric/Behavioral: Positive for depression and suicidal ideas.    DRUG ALLERGIES:   Allergies  Allergen Reactions  . Ondansetron Other (See Comments)    Constipation     VITALS:  Blood pressure 120/86, pulse 87, temperature 100 F (37.8 C), temperature source Oral, resp. rate (!) 21, height 5\' 6"  (1.676 m), weight 105.5 kg, SpO2 100 %.  PHYSICAL EXAMINATION:  Physical Exam  GENERAL:  48 y.o.-year-old obese patient lying in the bed with no acute distress.  EYES: Pupils equal, round, reactive to light and accommodation. No scleral icterus. Extraocular muscles intact.  HEENT: Head atraumatic, normocephalic. Oropharynx and nasopharynx clear.  NECK:  Supple, no jugular venous distention. No thyroid enlargement, no tenderness.  LUNGS: Normal breath sounds bilaterally, no wheezing, rales,rhonchi or crepitation. No use of accessory muscles of respiration.  CARDIOVASCULAR: S1, S2 normal. No murmurs, rubs, or gallops.  ABDOMEN: Soft, nontender,  nondistended. Bowel sounds present. No organomegaly or mass.  EXTREMITIES: No pedal edema, cyanosis, or clubbing.  NEUROLOGIC: Cranial nerves II through XII are intact. Muscle strength 5/5 in all extremities. Sensation intact. Gait not checked.  PSYCHIATRIC: The patient is alert and oriented x 3.  SKIN: No obvious rash, lesion, or ulcer.    LABORATORY PANEL:   CBC Recent Labs  Lab 06/30/18 0526  WBC 11.9*  HGB 7.6*  HCT 26.4*  PLT 260   ------------------------------------------------------------------------------------------------------------------  Chemistries  Recent Labs  Lab 06/30/18 0526  NA 143  K 3.5  CL 117*  CO2 18*  GLUCOSE 106*  BUN 11  CREATININE 0.89  CALCIUM 6.8*  AST 83*  ALT 37  ALKPHOS 46  BILITOT 0.5   ------------------------------------------------------------------------------------------------------------------  Cardiac Enzymes No results for input(s): TROPONINI in the last 168 hours. ------------------------------------------------------------------------------------------------------------------  RADIOLOGY:  Ct Head Wo Contrast  Result Date: 06/29/2018 CLINICAL DATA:  Altered level of consciousness EXAM: CT HEAD WITHOUT CONTRAST TECHNIQUE: Contiguous axial images were obtained from the base of the skull through the vertex without intravenous contrast. COMPARISON:  Report 11/04/2011 FINDINGS: Brain: No evidence of acute infarction, hemorrhage, hydrocephalus, extra-axial collection or mass lesion/mass effect. Vascular: No hyperdense vessel or unexpected calcification. Skull: Normal. Negative for fracture or focal lesion. Sinuses/Orbits: Mucosal thickening in the ethmoid sinuses Other: None IMPRESSION: Negative non contrasted CT appearance of the brain Electronically Signed   By: Donavan Foil M.D.   On: 06/29/2018 21:58   Dg Chest Port 1 View  Result Date: 06/30/2018 CLINICAL DATA:  Hypoxia EXAM: PORTABLE CHEST 1 VIEW COMPARISON:  June 29, 2018 FINDINGS: Endotracheal tube tip is 2.5 cm above the carina. Nasogastric tube tip is at the gastroesophageal junction,  apparently within a hiatal type hernia. No pneumothorax. There is atelectatic change in both lung bases. There is no frank consolidation or edema. Heart is upper normal in size with pulmonary vascularity normal. No adenopathy. No bone lesions. IMPRESSION: Tube and catheter positions as described without pneumothorax. Note that the endotracheal tube tip is at the gastroesophageal junction, likely with an apparent hiatal hernia. The side port is above the gastroesophageal junction. Advise advancing nasogastric tube approximately 10 cm. Bibasilar atelectasis. No frank edema or consolidation. Stable cardiac silhouette. Electronically Signed   By: Lowella Grip III M.D.   On: 06/30/2018 07:06   Dg Chest Portable 1 View  Result Date: 06/29/2018 CLINICAL DATA:  Worsening hypoxia. EXAM: PORTABLE CHEST 1 VIEW COMPARISON:  06/29/2018 FINDINGS: An endotracheal tube remains in place and terminates approximately 2 cm above the carina. An enteric tube has been placed and projects over the distal esophagus. The cardiomediastinal silhouette is unchanged with persistent bilateral hilar prominence. An enteric tube terminates just above the level of the left hemidiaphragm projecting over a moderately large hiatal hernia. No airspace consolidation, edema, pleural effusion, pneumothorax is identified. IMPRESSION: 1. Support devices as above. 2. No evidence of acute airspace disease. Electronically Signed   By: Logan Bores M.D.   On: 06/29/2018 20:22   Dg Chest Port 1 View  Result Date: 06/29/2018 CLINICAL DATA:  Intubated EXAM: PORTABLE CHEST 1 VIEW COMPARISON:  05/31/2011 FINDINGS: Endotracheal tube tip is about 2 cm superior to the carina. No acute consolidation or pleural effusion. Stable cardiomediastinal silhouette with prominence of the left greater than right hila. Retrocardiac opacity, likely  due to moderate hiatal hernia. No pneumothorax IMPRESSION: 1. Endotracheal tube tip about 2 cm superior to carina 2. No acute airspace disease Electronically Signed   By: Donavan Foil M.D.   On: 06/29/2018 19:32   Dg Abd Portable 1 View  Result Date: 06/29/2018 CLINICAL DATA:  Initial evaluation for OG tube placement. EXAM: PORTABLE ABDOMEN - 1 VIEW COMPARISON:  Prior radiograph from earlier same day. FINDINGS: Distal aspect of a probable enteric tube seen overlying the GE junction. Side hole not visualized, likely in the esophagus. Paucity of gas limits evaluation of the bowels. IMPRESSION: Tip of probable enteric tube overlying the GE junction. Side hole not visualize, likely in the distal esophagus. Electronically Signed   By: Jeannine Boga M.D.   On: 06/29/2018 20:04   Dg Abd Portable 1 View  Result Date: 06/29/2018 CLINICAL DATA:  OG tube placement EXAM: PORTABLE ABDOMEN - 1 VIEW COMPARISON:  None. FINDINGS: No radiopaque tubing visible over the lower mediastinum or left upper quadrant of the abdomen. IMPRESSION: No esophageal tube visualized over the lower chest or left upper quadrant. Electronically Signed   By: Donavan Foil M.D.   On: 06/29/2018 19:33    EKG:   Orders placed or performed during the hospital encounter of 06/29/18  . ED EKG  . ED EKG  . EKG 12-Lead  . EKG 12-Lead    ASSESSMENT AND PLAN:   48 year old female with past medical history significant for hypertension and anemia was brought in secondary to drug overdose.  1.  Drug overdose-patient was intubated on admission for airway protection -Currently extubated.  Overdosed on Klonopin and significant amounts of alcohol. -Poison control was contacted, recommended repeat EKG and telemetry monitor for 24 hours for any arrhythmias.  2.  Major depression with suicidal ideation-triggered by recent stressors about their house. -Sitter at bedside, suicidal precautions -Psych consult requested  3.  Aspiration  pneumonitis-intubated on admission for airway protection.  Currently on Unasyn for aspiration pneumonitis.  Follow-up chest x-ray in 1 to 2 days.  Breathing is much improved  4.  Hypertension-currently home medications of lisinopril, metoprolol and on hold.  Monitor and restart in a.m.  5.  DVT prophylaxis-on Lovenox   All the records are reviewed and case discussed with Care Management/Social Workerr. Management plans discussed with the patient, family and they are in agreement.  CODE STATUS: Full Code  TOTAL TIME TAKING CARE OF THIS PATIENT: 38 minutes.   POSSIBLE D/C IN 1-2 DAYS, DEPENDING ON CLINICAL CONDITION.   Gladstone Lighter M.D on 06/30/2018 at 2:08 PM  Between 7am to 6pm - Pager - [phone]  After 6pm go to [email or web address] - password [email or web address] [facility]  Office  [phone]  CC: Primary care physician; Ronnell Freshwater, PA-C

## 2018-07-01 ENCOUNTER — Inpatient Hospital Stay: Payer: BLUE CROSS/BLUE SHIELD

## 2018-07-01 LAB — BASIC METABOLIC PANEL
Anion gap: 8 (ref 5–15)
BUN: 7 mg/dL (ref 6–20)
CO2: 23 mmol/L (ref 22–32)
Calcium: 7.2 mg/dL — ABNORMAL LOW (ref 8.9–10.3)
Chloride: 108 mmol/L (ref 98–111)
Creatinine, Ser: 0.73 mg/dL (ref 0.44–1.00)
GFR calc Af Amer: >60 mL/min (ref >60)
GFR calc non Af Amer: >60 mL/min (ref >60)
Glucose, Bld: 104 mg/dL — ABNORMAL HIGH (ref 70–99)
Potassium: 2.9 mmol/L — ABNORMAL LOW (ref 3.5–5.1)
Sodium: 139 mmol/L (ref 135–145)

## 2018-07-01 LAB — CBC
HCT: 24.7 % — ABNORMAL LOW (ref 36.0–46.0)
HCT: 24.5 % — ABNORMAL LOW (ref 36.0–46.0)
Hemoglobin: 7.1 g/dL — ABNORMAL LOW (ref 12.0–15.0)
Hemoglobin: 7.2 g/dL — ABNORMAL LOW (ref 12.0–15.0)
MCH: 23.4 pg — ABNORMAL LOW (ref 26.0–34.0)
MCH: 23.5 pg — ABNORMAL LOW (ref 26.0–34.0)
MCHC: 28.7 g/dL — ABNORMAL LOW (ref 30.0–36.0)
MCHC: 29.4 g/dL — ABNORMAL LOW (ref 30.0–36.0)
MCV: 81.3 fL (ref 80.0–100.0)
MCV: 80.1 fL (ref 80.0–100.0)
Platelets: 202 10*3/uL (ref 150–400)
Platelets: 221 10*3/uL (ref 150–400)
RBC: 3.04 MIL/uL — ABNORMAL LOW (ref 3.87–5.11)
RBC: 3.06 MIL/uL — ABNORMAL LOW (ref 3.87–5.11)
RDW: 15.9 % — ABNORMAL HIGH (ref 11.5–15.5)
RDW: 15.8 % — ABNORMAL HIGH (ref 11.5–15.5)
WBC: 10.3 10*3/uL (ref 4.0–10.5)
WBC: 11.1 10*3/uL — ABNORMAL HIGH (ref 4.0–10.5)
nRBC: 0 % (ref 0.0–0.2)
nRBC: 0.2 % (ref 0.0–0.2)

## 2018-07-01 LAB — IRON AND TIBC
Iron: 19 ug/dL — ABNORMAL LOW (ref 28–170)
Saturation Ratios: 5 % — ABNORMAL LOW (ref 10.4–31.8)
TIBC: 423 ug/dL (ref 250–450)
UIBC: 404 ug/dL

## 2018-07-01 LAB — PREPARE RBC (CROSSMATCH)

## 2018-07-01 LAB — GLUCOSE, CAPILLARY
Glucose-Capillary: 102 mg/dL — ABNORMAL HIGH (ref 70–99)
Glucose-Capillary: 117 mg/dL — ABNORMAL HIGH (ref 70–99)

## 2018-07-01 LAB — RETICULOCYTES
Immature Retic Fract: 32.8 % — ABNORMAL HIGH (ref 2.3–15.9)
RBC.: 2.98 MIL/uL — ABNORMAL LOW (ref 3.87–5.11)
Retic Count, Absolute: 55.4 10*3/uL (ref 19.0–186.0)
Retic Ct Pct: 1.9 % (ref 0.4–3.1)

## 2018-07-01 LAB — FOLATE: Folate: 7.3 ng/mL (ref >5.9)

## 2018-07-01 LAB — ABO/RH: ABO/RH(D): A POS

## 2018-07-01 LAB — URINE CULTURE: Culture: NO GROWTH

## 2018-07-01 LAB — VITAMIN B12: Vitamin B-12: 258 pg/mL (ref 180–914)

## 2018-07-01 LAB — HIV ANTIBODY (ROUTINE TESTING W REFLEX): HIV Screen 4th Generation wRfx: NONREACTIVE

## 2018-07-01 LAB — POTASSIUM: Potassium: 3.2 mmol/L — ABNORMAL LOW (ref 3.5–5.1)

## 2018-07-01 LAB — MAGNESIUM: Magnesium: 1.7 mg/dL (ref 1.7–2.4)

## 2018-07-01 LAB — PROCALCITONIN: Procalcitonin: 0.3 ng/mL

## 2018-07-01 LAB — FERRITIN: Ferritin: 23 ng/mL (ref 11–307)

## 2018-07-01 MED ORDER — PANTOPRAZOLE SODIUM 40 MG PO TBEC
40.0000 mg | DELAYED_RELEASE_TABLET | Freq: Two times a day (BID) | ORAL | Status: DC
  Administered 2018-07-01 – 2018-07-02 (×3): 40 mg via ORAL
  Filled 2018-07-01 (×3): qty 1

## 2018-07-01 MED ORDER — SODIUM CHLORIDE 0.9% IV SOLUTION: Freq: Once | INTRAVENOUS | Status: DC

## 2018-07-01 MED ORDER — SODIUM CHLORIDE 0.9 % IV SOLN
200.0000 mg | INTRAVENOUS | Status: AC
  Administered 2018-07-01 – 2018-07-02 (×2): 200 mg via INTRAVENOUS
  Filled 2018-07-01 (×3): qty 10

## 2018-07-01 MED ORDER — LISINOPRIL 10 MG PO TABS
10.0000 mg | ORAL_TABLET | Freq: Every day | ORAL | Status: DC
  Administered 2018-07-01: 10 mg via ORAL
  Filled 2018-07-01: qty 1

## 2018-07-01 MED ORDER — PANTOPRAZOLE SODIUM 40 MG PO TBEC
40.0000 mg | DELAYED_RELEASE_TABLET | Freq: Every day | ORAL | Status: DC
  Administered 2018-07-01: 40 mg via ORAL
  Filled 2018-07-01: qty 1

## 2018-07-01 MED ORDER — METOPROLOL SUCCINATE ER 25 MG PO TB24
25.0000 mg | ORAL_TABLET | Freq: Every day | ORAL | Status: DC
  Administered 2018-07-01: 25 mg via ORAL
  Filled 2018-07-01: qty 1

## 2018-07-01 MED ORDER — POTASSIUM CHLORIDE CRYS ER 20 MEQ PO TBCR
40.0000 meq | EXTENDED_RELEASE_TABLET | ORAL | Status: AC
  Administered 2018-07-01: 40 meq via ORAL
  Filled 2018-07-01: qty 2

## 2018-07-01 NOTE — Progress Notes (Signed)
[facility] [place] NAME: Lisa Vasquez    MR#:  [id number]  DATE OF BIRTH:  [birth year]  SUBJECTIVE:  CHIEF COMPLAINT:   Chief Complaint  Patient presents with  . Drug Overdose   -Continues to be depressed, has a sitter at bedside as per suicide precautions -Sinus tachycardic.  Denies any chest pain or palpitations. -Known history of anemia requiring IV iron -Tearful at times and hyperventilating  REVIEW OF SYSTEMS:  Review of Systems  Constitutional: Positive for malaise/fatigue. Negative for chills and fever.  HENT: Negative for hearing loss.   Eyes: Negative for blurred vision and double vision.  Respiratory: Negative for cough, shortness of breath and wheezing.   Cardiovascular: Negative for chest pain, palpitations and leg swelling.  Gastrointestinal: Negative for abdominal pain, constipation, diarrhea, nausea and vomiting.  Genitourinary: Negative for dysuria and hematuria.  Musculoskeletal: Negative for myalgias.  Neurological: Negative for dizziness, focal weakness, seizures, weakness and headaches.  Psychiatric/Behavioral: Positive for depression and suicidal ideas.    DRUG ALLERGIES:   Allergies  Allergen Reactions  . Ondansetron Other (See Comments)    Constipation     VITALS:  Blood pressure (!) 134/97, pulse (!) 107, temperature 98.4 F (36.9 C), temperature source Oral, resp. rate 19, height 5\' 6"  (1.676 m), weight 105.5 kg, SpO2 93 %.  PHYSICAL EXAMINATION:  Physical Exam  GENERAL:  48 y.o.-year-old obese patient lying in the bed with no acute distress.  EYES: Pupils equal, round, reactive to light and accommodation. No scleral icterus. Extraocular muscles intact.  HEENT: Head atraumatic, normocephalic. Oropharynx and nasopharynx clear.  NECK:  Supple, no jugular venous distention. No thyroid enlargement, no tenderness.  LUNGS: Normal breath sounds bilaterally, no wheezing, rales,rhonchi or crepitation.  No use of accessory muscles of respiration.  CARDIOVASCULAR: S1, S2 normal. No murmurs, rubs, or gallops.  ABDOMEN: Soft, nontender, nondistended. Bowel sounds present. No organomegaly or mass.  EXTREMITIES: No pedal edema, cyanosis, or clubbing.  NEUROLOGIC: Cranial nerves II through XII are intact. Muscle strength 5/5 in all extremities. Sensation intact. Gait not checked.  PSYCHIATRIC: The patient is alert and oriented x 3.  Appears depressed, tearful and occasional hyperventilating during talking SKIN: No obvious rash, lesion, or ulcer.    LABORATORY PANEL:   CBC Recent Labs  Lab 07/01/18 0543  WBC 10.3  HGB 7.1*  HCT 24.7*  PLT 202   ------------------------------------------------------------------------------------------------------------------  Chemistries  Recent Labs  Lab 06/30/18 0526 07/01/18 0543  NA 143 139  K 3.5 2.9*  CL 117* 108  CO2 18* 23  GLUCOSE 106* 104*  BUN 11 7  CREATININE 0.89 0.73  CALCIUM 6.8* 7.2*  AST 83*  --   ALT 37  --   ALKPHOS 46  --   BILITOT 0.5  --    ------------------------------------------------------------------------------------------------------------------  Cardiac Enzymes No results for input(s): TROPONINI in the last 168 hours. ------------------------------------------------------------------------------------------------------------------  RADIOLOGY:  Ct Head Wo Contrast  Result Date: 06/29/2018 CLINICAL DATA:  Altered level of consciousness EXAM: CT HEAD WITHOUT CONTRAST TECHNIQUE: Contiguous axial images were obtained from the base of the skull through the vertex without intravenous contrast. COMPARISON:  Report 11/04/2011 FINDINGS: Brain: No evidence of acute infarction, hemorrhage, hydrocephalus, extra-axial collection or mass lesion/mass effect. Vascular: No hyperdense vessel or unexpected calcification. Skull: Normal. Negative for fracture or focal lesion. Sinuses/Orbits: Mucosal thickening in the ethmoid sinuses  Other: None IMPRESSION: Negative non contrasted CT appearance of the brain Electronically Signed   By: Maudie Mercury  Francoise Ceo M.D.   On: 06/29/2018 21:58   Dg Chest Port 1 View  Result Date: 07/01/2018 CLINICAL DATA:  Respiratory failure EXAM: PORTABLE CHEST 1 VIEW COMPARISON:  June 30, 2018 FINDINGS: Endotracheal tube and nasogastric tube have been removed. No pneumothorax. There is no edema or consolidation. Heart size and pulmonary vascularity are normal. There is a moderate hiatal type hernia. No appreciable adenopathy. No bone lesions. IMPRESSION: Moderate hiatal hernia. No edema or consolidation. Stable cardiac silhouette. No pneumothorax. Electronically Signed   By: Lowella Grip III M.D.   On: 07/01/2018 08:28   Dg Chest Port 1 View  Result Date: 06/30/2018 CLINICAL DATA:  Hypoxia EXAM: PORTABLE CHEST 1 VIEW COMPARISON:  June 29, 2018 FINDINGS: Endotracheal tube tip is 2.5 cm above the carina. Nasogastric tube tip is at the gastroesophageal junction, apparently within a hiatal type hernia. No pneumothorax. There is atelectatic change in both lung bases. There is no frank consolidation or edema. Heart is upper normal in size with pulmonary vascularity normal. No adenopathy. No bone lesions. IMPRESSION: Tube and catheter positions as described without pneumothorax. Note that the endotracheal tube tip is at the gastroesophageal junction, likely with an apparent hiatal hernia. The side port is above the gastroesophageal junction. Advise advancing nasogastric tube approximately 10 cm. Bibasilar atelectasis. No frank edema or consolidation. Stable cardiac silhouette. Electronically Signed   By: Lowella Grip III M.D.   On: 06/30/2018 07:06   Dg Chest Portable 1 View  Result Date: 06/29/2018 CLINICAL DATA:  Worsening hypoxia. EXAM: PORTABLE CHEST 1 VIEW COMPARISON:  06/29/2018 FINDINGS: An endotracheal tube remains in place and terminates approximately 2 cm above the carina. An enteric tube has been  placed and projects over the distal esophagus. The cardiomediastinal silhouette is unchanged with persistent bilateral hilar prominence. An enteric tube terminates just above the level of the left hemidiaphragm projecting over a moderately large hiatal hernia. No airspace consolidation, edema, pleural effusion, pneumothorax is identified. IMPRESSION: 1. Support devices as above. 2. No evidence of acute airspace disease. Electronically Signed   By: Logan Bores M.D.   On: 06/29/2018 20:22   Dg Chest Port 1 View  Result Date: 06/29/2018 CLINICAL DATA:  Intubated EXAM: PORTABLE CHEST 1 VIEW COMPARISON:  05/31/2011 FINDINGS: Endotracheal tube tip is about 2 cm superior to the carina. No acute consolidation or pleural effusion. Stable cardiomediastinal silhouette with prominence of the left greater than right hila. Retrocardiac opacity, likely due to moderate hiatal hernia. No pneumothorax IMPRESSION: 1. Endotracheal tube tip about 2 cm superior to carina 2. No acute airspace disease Electronically Signed   By: Donavan Foil M.D.   On: 06/29/2018 19:32   Dg Abd Portable 1 View  Result Date: 06/29/2018 CLINICAL DATA:  Initial evaluation for OG tube placement. EXAM: PORTABLE ABDOMEN - 1 VIEW COMPARISON:  Prior radiograph from earlier same day. FINDINGS: Distal aspect of a probable enteric tube seen overlying the GE junction. Side hole not visualized, likely in the esophagus. Paucity of gas limits evaluation of the bowels. IMPRESSION: Tip of probable enteric tube overlying the GE junction. Side hole not visualize, likely in the distal esophagus. Electronically Signed   By: Jeannine Boga M.D.   On: 06/29/2018 20:04   Dg Abd Portable 1 View  Result Date: 06/29/2018 CLINICAL DATA:  OG tube placement EXAM: PORTABLE ABDOMEN - 1 VIEW COMPARISON:  None. FINDINGS: No radiopaque tubing visible over the lower mediastinum or left upper quadrant of the abdomen. IMPRESSION: No esophageal tube visualized over the  lower  chest or left upper quadrant. Electronically Signed   By: Donavan Foil M.D.   On: 06/29/2018 19:33    EKG:   Orders placed or performed during the hospital encounter of 06/29/18  . ED EKG  . ED EKG  . EKG 12-Lead  . EKG 12-Lead    ASSESSMENT AND PLAN:   48 year old female with past medical history significant for hypertension and anemia was brought in secondary to drug overdose.  1.  Drug overdose-patient was intubated on admission for airway protection -Currently extubated.  Overdosed on Klonopin and significant amounts of alcohol. -Poison control was contacted, EKG with sinus tachycardia and occasional PVCs.  2.  Major depression with suicidal ideation-triggered by recent stressors -Appreciate psych consult.[name] at bedside, suicidal precautions -Started on [place].  Will need inpatient behavioral medicine admission prior to discharge.  3.  Aspiration pneumonitis-intubated on admission for airway protection.  Started on Unasyn. -Repeat chest x-ray with no pneumonia.  On room air.  However procalcitonin seems to be elevated.  Blood cultures are negative. -Discontinue Unasyn and monitor -WBC is normalized.  Urine analysis without any infection.  4.  Hypertension-started home medications of metoprolol and lisinopril.  5.  Hypokalemia-being replaced  6.  Acute on chronic iron deficiency anemia-patient has known history of anemia. -Decreased iron levels, has received IV iron in the past. -With sinus tachycardia and hemoglobin around 7, will give 1 unit transfusion and start IV iron -Continue outpatient follow-up.  No active bleeding noted  7.  DVT prophylaxis-on Lovenox  Possible discharge to behavioral medicine tomorrow   All the records are reviewed and case discussed with Care Management/Social Workerr. Management plans discussed with the patient, family and they are in agreement.  CODE STATUS: Full Code  TOTAL TIME TAKING CARE OF THIS PATIENT: 38 minutes.    POSSIBLE D/C tomorrow, DEPENDING ON CLINICAL CONDITION.   Gladstone Lighter M.D on 07/01/2018 at 10:57 AM  Between 7am to 6pm - Pager - [phone]  After 6pm go to [email or web address] - password [email or web address] [facility]  Office  [phone]  CC: Primary care physician; Ronnell Freshwater, PA-C

## 2018-07-01 NOTE — Consult Note (Signed)
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 nebulizer solution 3 mL  3 mL Nebulization Q4H PRN Darel Hong D, NP      . iron  sucrose (VENOFER) 200 mg in sodium chloride 0.9 % 150 mL IVPB  200 mg Intravenous Q24H Gladstone Lighter, MD      . lisinopril (PRINIVIL,ZESTRIL) tablet 10 mg  10 mg Oral Daily Gladstone Lighter, MD      . lurasidone (LATUDA) tablet 40 mg  40 mg Oral Q breakfast Lavella Hammock, MD      . metoprolol succinate (TOPROL-XL) 24 hr tablet 25 mg  25 mg Oral Daily Gladstone Lighter, MD      . pantoprazole (PROTONIX) EC tablet 40 mg  40 mg Oral BID AC Kalisetti, Radhika, MD      . phenol (CHLORASEPTIC) mouth spray 1 spray  1 spray Mouth/Throat PRN Wilhelmina Mcardle, MD      . potassium chloride SA (K-DUR,KLOR-CON) CR tablet 40 mEq  40 mEq Oral Q4H Gladstone Lighter, MD        Musculoskeletal: Strength & Muscle Tone: within normal limits Gait & Station: normal Patient leans: N/A  Psychiatric Specialty Exam: Physical Exam  Nursing note and vitals reviewed. Constitutional: She is oriented to person, place, and time. She appears well-developed and well-nourished. No distress.  HENT:  Head: Normocephalic and atraumatic.  Eyes: EOM are normal.  Neck: Normal range of motion.  Cardiovascular: Regular rhythm.  Tachycardia  Respiratory: Effort normal. No respiratory distress.  Musculoskeletal: Normal range of motion.  Neurological: She is alert and oriented to person, place, and time.  Skin: Skin is warm and dry.    Review of Systems  Constitutional: Positive for weight loss.  HENT: Positive for sore throat.   Respiratory: Negative.   Cardiovascular: Negative.   Gastrointestinal: Positive for nausea.  Musculoskeletal: Positive for myalgias.  Neurological: Negative.   Psychiatric/Behavioral: Positive for depression, substance abuse and suicidal ideas. Negative for hallucinations and memory loss. The patient is nervous/anxious and has insomnia.     Blood pressure (!) 134/97, pulse (!) 107, temperature 98.4 F (36.9 C), temperature source Oral, resp. rate 19, height 5\' 6"  (1.676 m), weight  105.5 kg, SpO2 93 %.Body mass index is 37.54 kg/m.  General Appearance: Neat  Eye Contact:  Fair  Speech:  Clear and Coherent and Normal Rate  Volume:  Normal  Mood:  Anxious, Depressed and guilty  Affect:  Congruent  Thought Process:  Coherent and Descriptions of Associations: Intact  Orientation:  Full (Time, Place, and Person)  Thought Content:  Hallucinations: None  Suicidal Thoughts:  Yes.  without intent/plan  Homicidal Thoughts:  No  Memory:  Immediate;   Fair Recent;   Poor Remote;   Good  Judgement:  Impaired  Insight:  Shallow  Psychomotor Activity:  Normal  Concentration:  Concentration: Fair  Recall:  Poor  Fund of Knowledge:  Good  Language:  Good  Akathisia:  No  Handed:  Right  AIMS (if indicated):     Assets:  Communication Skills  ADL's:  Intact  Cognition:  WNL  Sleep:   decreased     Treatment Plan Summary: Daily contact with patient to assess and evaluate symptoms and progress in treatment, Medication management and Start Latuda 40 mg daily for Bipolar depression  Continue to hold Zoloft at this time.  Disposition: Recommend psychiatric Inpatient admission when medically cleared. Supportive therapy provided about ongoing stressors.  Patient will need to be stable off of oxygen, IV fluids and antibiotics with stable hemoglobin/hematocrit.  Tentative admission to inpatient psychiatry for July 02, 2018.  Lavella Hammock, MD 07/01/2018 11:11 AM

## 2018-07-02 ENCOUNTER — Inpatient Hospital Stay
Admission: AD | Admit: 2018-07-02 | Discharge: 2018-07-04 | DRG: 885 | Disposition: A | Discharge status: Other Acute Inpatient Hospital | Payer: BLUE CROSS/BLUE SHIELD | Source: Intra-hospital | Attending: Family Medicine | Admitting: Family Medicine

## 2018-07-02 ENCOUNTER — Other Ambulatory Visit: Payer: Self-pay

## 2018-07-02 DIAGNOSIS — Z808 Family history of malignant neoplasm of other organs or systems: Secondary | ICD-10-CM | POA: Diagnosis not present

## 2018-07-02 DIAGNOSIS — E538 Deficiency of other specified B group vitamins: Secondary | ICD-10-CM | POA: Diagnosis present

## 2018-07-02 DIAGNOSIS — Z7951 Long term (current) use of inhaled steroids: Secondary | ICD-10-CM

## 2018-07-02 DIAGNOSIS — Z599 Problem related to housing and economic circumstances, unspecified: Secondary | ICD-10-CM | POA: Diagnosis not present

## 2018-07-02 DIAGNOSIS — E669 Obesity, unspecified: Secondary | ICD-10-CM | POA: Diagnosis present

## 2018-07-02 DIAGNOSIS — Z825 Family history of asthma and other chronic lower respiratory diseases: Secondary | ICD-10-CM

## 2018-07-02 DIAGNOSIS — F1020 Alcohol dependence, uncomplicated: Secondary | ICD-10-CM | POA: Diagnosis present

## 2018-07-02 DIAGNOSIS — F419 Anxiety disorder, unspecified: Secondary | ICD-10-CM | POA: Diagnosis present

## 2018-07-02 DIAGNOSIS — Z87891 Personal history of nicotine dependence: Secondary | ICD-10-CM

## 2018-07-02 DIAGNOSIS — J9601 Acute respiratory failure with hypoxia: Secondary | ICD-10-CM | POA: Diagnosis not present

## 2018-07-02 DIAGNOSIS — Z85828 Personal history of other malignant neoplasm of skin: Secondary | ICD-10-CM

## 2018-07-02 DIAGNOSIS — D509 Iron deficiency anemia, unspecified: Secondary | ICD-10-CM | POA: Diagnosis present

## 2018-07-02 DIAGNOSIS — Z818 Family history of other mental and behavioral disorders: Secondary | ICD-10-CM | POA: Diagnosis not present

## 2018-07-02 DIAGNOSIS — Z915 Personal history of self-harm: Secondary | ICD-10-CM | POA: Diagnosis not present

## 2018-07-02 DIAGNOSIS — K219 Gastro-esophageal reflux disease without esophagitis: Secondary | ICD-10-CM | POA: Diagnosis present

## 2018-07-02 DIAGNOSIS — Z803 Family history of malignant neoplasm of breast: Secondary | ICD-10-CM | POA: Diagnosis not present

## 2018-07-02 DIAGNOSIS — Z8249 Family history of ischemic heart disease and other diseases of the circulatory system: Secondary | ICD-10-CM

## 2018-07-02 DIAGNOSIS — Z5181 Encounter for therapeutic drug level monitoring: Secondary | ICD-10-CM | POA: Diagnosis not present

## 2018-07-02 DIAGNOSIS — F314 Bipolar disorder, current episode depressed, severe, without psychotic features: Secondary | ICD-10-CM | POA: Diagnosis present

## 2018-07-02 DIAGNOSIS — I10 Essential (primary) hypertension: Secondary | ICD-10-CM | POA: Diagnosis present

## 2018-07-02 DIAGNOSIS — Z79899 Other long term (current) drug therapy: Secondary | ICD-10-CM

## 2018-07-02 DIAGNOSIS — R0602 Shortness of breath: Secondary | ICD-10-CM

## 2018-07-02 DIAGNOSIS — F332 Major depressive disorder, recurrent severe without psychotic features: Secondary | ICD-10-CM | POA: Diagnosis present

## 2018-07-02 DIAGNOSIS — E785 Hyperlipidemia, unspecified: Secondary | ICD-10-CM | POA: Diagnosis present

## 2018-07-02 DIAGNOSIS — Z6838 Body mass index (BMI) 38.0-38.9, adult: Secondary | ICD-10-CM

## 2018-07-02 DIAGNOSIS — J9690 Respiratory failure, unspecified, unspecified whether with hypoxia or hypercapnia: Secondary | ICD-10-CM | POA: Diagnosis present

## 2018-07-02 DIAGNOSIS — J690 Pneumonitis due to inhalation of food and vomit: Secondary | ICD-10-CM | POA: Diagnosis present

## 2018-07-02 LAB — BPAM RBC
Blood Product Expiration Date: 202003282359
ISSUE DATE / TIME: 202003121513
Unit Type and Rh: 6200

## 2018-07-02 LAB — TYPE AND SCREEN
ABO/RH(D): A POS
Antibody Screen: NEGATIVE
Unit division: 0

## 2018-07-02 LAB — CULTURE, RESPIRATORY W GRAM STAIN
Culture: NORMAL
Special Requests: NORMAL

## 2018-07-02 LAB — BASIC METABOLIC PANEL
Anion gap: 9 (ref 5–15)
BUN: 6 mg/dL (ref 6–20)
CO2: 22 mmol/L (ref 22–32)
Calcium: 7.7 mg/dL — ABNORMAL LOW (ref 8.9–10.3)
Chloride: 107 mmol/L (ref 98–111)
Creatinine, Ser: 0.62 mg/dL (ref 0.44–1.00)
GFR calc Af Amer: >60 mL/min (ref >60)
GFR calc non Af Amer: >60 mL/min (ref >60)
Glucose, Bld: 101 mg/dL — ABNORMAL HIGH (ref 70–99)
Potassium: 3.1 mmol/L — ABNORMAL LOW (ref 3.5–5.1)
Sodium: 138 mmol/L (ref 135–145)

## 2018-07-02 LAB — CBC
HCT: 26 % — ABNORMAL LOW (ref 36.0–46.0)
Hemoglobin: 7.8 g/dL — ABNORMAL LOW (ref 12.0–15.0)
MCH: 24.1 pg — ABNORMAL LOW (ref 26.0–34.0)
MCHC: 30 g/dL (ref 30.0–36.0)
MCV: 80.2 fL (ref 80.0–100.0)
Platelets: 203 10*3/uL (ref 150–400)
RBC: 3.24 MIL/uL — ABNORMAL LOW (ref 3.87–5.11)
RDW: 15.3 % (ref 11.5–15.5)
WBC: 8.4 10*3/uL (ref 4.0–10.5)
nRBC: 0.6 % — ABNORMAL HIGH (ref 0.0–0.2)

## 2018-07-02 LAB — GLUCOSE, CAPILLARY
Glucose-Capillary: 112 mg/dL — ABNORMAL HIGH (ref 70–99)
Glucose-Capillary: 72 mg/dL (ref 70–99)

## 2018-07-02 LAB — OCCULT BLOOD X 1 CARD TO LAB, STOOL: Fecal Occult Bld: NEGATIVE

## 2018-07-02 LAB — PROCALCITONIN: Procalcitonin: 0.18 ng/mL

## 2018-07-02 MED ORDER — SERTRALINE HCL 100 MG PO TABS
100.0000 mg | ORAL_TABLET | Freq: Every day | ORAL | Status: DC
  Administered 2018-07-03 – 2018-07-04 (×2): 100 mg via ORAL
  Filled 2018-07-02 (×2): qty 1

## 2018-07-02 MED ORDER — TRAZODONE HCL 100 MG PO TABS
100.0000 mg | ORAL_TABLET | Freq: Every evening | ORAL | Status: DC | PRN
  Administered 2018-07-03 (×2): 100 mg via ORAL
  Filled 2018-07-02 (×2): qty 1

## 2018-07-02 MED ORDER — AZITHROMYCIN 250 MG PO TABS: ORAL_TABLET | ORAL | 0 refills | Status: DC

## 2018-07-02 MED ORDER — AZITHROMYCIN 250 MG PO TABS
500.0000 mg | ORAL_TABLET | Freq: Every day | ORAL | Status: AC
  Administered 2018-07-02: 500 mg via ORAL
  Filled 2018-07-02: qty 2

## 2018-07-02 MED ORDER — LISINOPRIL 20 MG PO TABS
20.0000 mg | ORAL_TABLET | Freq: Every day | ORAL | Status: DC
  Administered 2018-07-03 – 2018-07-04 (×2): 20 mg via ORAL
  Filled 2018-07-02 (×2): qty 1

## 2018-07-02 MED ORDER — ACETAMINOPHEN 325 MG PO TABS
650.0000 mg | ORAL_TABLET | Freq: Four times a day (QID) | ORAL | Status: DC | PRN
  Administered 2018-07-03: 650 mg via ORAL
  Filled 2018-07-02 (×2): qty 2

## 2018-07-02 MED ORDER — LURASIDONE HCL 40 MG PO TABS
40.0000 mg | ORAL_TABLET | Freq: Every day | ORAL | Status: DC
  Administered 2018-07-03 – 2018-07-04 (×2): 40 mg via ORAL
  Filled 2018-07-02 (×3): qty 1

## 2018-07-02 MED ORDER — METOPROLOL SUCCINATE ER 50 MG PO TB24
50.0000 mg | ORAL_TABLET | Freq: Every day | ORAL | Status: DC
  Administered 2018-07-02: 50 mg via ORAL
  Filled 2018-07-02: qty 1

## 2018-07-02 MED ORDER — LISINOPRIL 20 MG PO TABS: 20.0000 mg | ORAL_TABLET | Freq: Every day | ORAL | 2 refills | Status: DC

## 2018-07-02 MED ORDER — SERTRALINE HCL 50 MG PO TABS
100.0000 mg | ORAL_TABLET | Freq: Every day | ORAL | Status: DC
  Administered 2018-07-02: 100 mg via ORAL
  Filled 2018-07-02: qty 2

## 2018-07-02 MED ORDER — LISINOPRIL 20 MG PO TABS
20.0000 mg | ORAL_TABLET | Freq: Every day | ORAL | Status: DC
  Administered 2018-07-02: 20 mg via ORAL
  Filled 2018-07-02: qty 1

## 2018-07-02 MED ORDER — ALUM & MAG HYDROXIDE-SIMETH 200-200-20 MG/5ML PO SUSP: 30.0000 mL | ORAL | Status: DC | PRN

## 2018-07-02 MED ORDER — AZITHROMYCIN 250 MG PO TABS: 250.0000 mg | ORAL_TABLET | Freq: Every day | ORAL | Status: DC

## 2018-07-02 MED ORDER — METOPROLOL SUCCINATE ER 50 MG PO TB24: 50.0000 mg | ORAL_TABLET | Freq: Every day | ORAL | 2 refills | Status: DC

## 2018-07-02 MED ORDER — MAGNESIUM HYDROXIDE 400 MG/5ML PO SUSP: 30.0000 mL | Freq: Every day | ORAL | Status: DC | PRN

## 2018-07-02 MED ORDER — ALBUTEROL SULFATE HFA 108 (90 BASE) MCG/ACT IN AERS: 2.0000 | INHALATION_SPRAY | Freq: Four times a day (QID) | RESPIRATORY_TRACT | 2 refills | Status: DC | PRN

## 2018-07-02 MED ORDER — LURASIDONE HCL 40 MG PO TABS: 40.0000 mg | ORAL_TABLET | Freq: Every day | ORAL | Status: DC

## 2018-07-02 MED ORDER — AZITHROMYCIN 250 MG PO TABS
250.0000 mg | ORAL_TABLET | Freq: Every day | ORAL | Status: DC
  Administered 2018-07-03 – 2018-07-04 (×2): 250 mg via ORAL
  Filled 2018-07-02 (×3): qty 1

## 2018-07-02 MED ORDER — PANTOPRAZOLE SODIUM 40 MG PO TBEC: 40.0000 mg | DELAYED_RELEASE_TABLET | Freq: Two times a day (BID) | ORAL | 3 refills | Status: DC

## 2018-07-02 MED ORDER — LABETALOL HCL 5 MG/ML IV SOLN
20.0000 mg | INTRAVENOUS | Status: DC | PRN
  Filled 2018-07-02: qty 4

## 2018-07-02 NOTE — Plan of Care (Signed)
48 year old woman admitted to icu after overdose. Still suicidal with no active plan, depressed with anxiety. Family is supportive. Pt is cooperative, calm and hopeful.

## 2018-07-02 NOTE — Discharge Summary (Signed)
Lisa Vasquez at [place] NAME: Lisa Vasquez    MR#:  [id number]  DATE OF BIRTH:  1971/01/30  DATE OF ADMISSION:  06/29/2018   ADMITTING PHYSICIAN: Lance Coon, MD  DATE OF DISCHARGE:  07/02/18  PRIMARY CARE PHYSICIAN: Ronnell Freshwater, PA-C   ADMISSION DIAGNOSIS:   Suicide attempt ([place]) [T14.91XA] Encounter for intubation [Z01.818] Acute respiratory failure with hypoxia ([name]) [J96.01] Polysubstance overdose, intentional self-harm, initial encounter ([place]) [T50.902A]  DISCHARGE DIAGNOSIS:   Principal Problem:   Acute respiratory failure with hypoxia ([name]) Active Problems:   Drug overdose   Alcoholic intoxication with complication ([name])   HTN (hypertension)   SECONDARY DIAGNOSIS:   Past Medical History:  Diagnosis Date  . Anemia   . Hypertension   . Skin cancer     HOSPITAL COURSE:   48 year old female with past medical history significant for hypertension and anemia was brought in secondary to drug overdose.  1.  Drug overdose-patient was intubated on admission for airway protection and admission -Extubated and currently on room air.  Breathing is much improved -  Overdosed on 15 tablets of 0.5 mg Klonopin and significant amounts of alcohol. -Poison control was contacted,  was monitored for cardiac arrhythmias on telemetry.  2.  Major depression with suicidal ideation-triggered by recent stressors -Appreciate psych consult.[name] at bedside, suicidal precautions -Started on [place].  Will need inpatient behavioral medicine admission prior to discharge.  3.  Aspiration pneumonitis-intubated on admission for airway protection. In the hospital. -Procalcitonin is improving.  Some scattered wheezing on exam.  Given atypical symptoms, will start oral Z-Pak and albuterol inhaler.  Patient is not a smoker. -Repeat chest x-ray with no pneumonia.  On room air.   -WBC is normalized.  Urine analysis without any infection.   4.  Hypertension-started home medications of metoprolol and lisinopril.  Doses adjusted since blood pressure is elevated.  Partly from anxiety  5.  Hypokalemia- replaced  6.  Acute on chronic iron deficiency anemia-patient has known history of anemia. -Decreased iron levels, has received IV iron in the past. -With sinus tachycardia and hemoglobin around 7, stayed 1 unit transfusion and also received IV iron for 2 days in the hospital.  Hemoglobin at 7.8. -No active bleeding noted.  Will need to follow-up with hematology as outpatient. -Tachycardia has resolved   Medically stable.  Will be discharged to behavioral medicine unit today for inpatient treatment of depression   DISCHARGE CONDITIONS:   Guarded  CONSULTS OBTAINED:   Treatment Team:  Clapacs, Madie Reno, MD  DRUG ALLERGIES:   Allergies  Allergen Reactions  . Ondansetron Other (See Comments)    Constipation    DISCHARGE MEDICATIONS:   Allergies as of 07/02/2018      Reactions   Ondansetron Other (See Comments)   Constipation      Medication List    STOP taking these medications   nortriptyline 10 MG capsule Commonly known as:  PAMELOR   omeprazole 40 MG capsule Commonly known as:  PriLOSEC   sertraline 100 MG tablet Commonly known as:  ZOLOFT     TAKE these medications   albuterol 108 (90 Base) MCG/ACT inhaler Commonly known as:  PROVENTIL HFA;VENTOLIN HFA Inhale 2 puffs into the lungs every 6 (six) hours as needed for wheezing or shortness of breath.   azithromycin 250 MG tablet Commonly known as:  ZITHROMAX For 4 days Start taking on:  July 03, 2018   cyclobenzaprine 10 MG tablet Commonly  known as:  FLEXERIL Take 10 mg by mouth daily.   fluticasone 50 MCG/ACT nasal spray Commonly known as:  FLONASE PLACE 1 SPRAY IN EACH NOSTRIL D   lisinopril 20 MG tablet Commonly known as:  PRINIVIL,ZESTRIL Take 1 tablet (20 mg total) by mouth daily. What changed:    medication strength  how  much to take   lurasidone 40 MG Tabs tablet Commonly known as:  LATUDA Take 1 tablet (40 mg total) by mouth daily with breakfast.   metoprolol succinate 50 MG 24 hr tablet Commonly known as:  TOPROL-XL Take 1 tablet (50 mg total) by mouth daily. Take with or immediately following a meal. What changed:    medication strength  how much to take  additional instructions   pantoprazole 40 MG tablet Commonly known as:  PROTONIX Take 1 tablet (40 mg total) by mouth 2 (two) times daily before a meal.   rizatriptan 10 MG tablet Commonly known as:  MAXALT Take 10 mg by mouth as needed for migraine. May repeat in 2 hours if needed   simvastatin 40 MG tablet Commonly known as:  ZOCOR Take 40 mg by mouth daily.        DISCHARGE INSTRUCTIONS:   1.  Patient will be discharged to inpatient psychiatry  DIET:   Cardiac diet  ACTIVITY:   Activity as tolerated  OXYGEN:   Home Oxygen: No.  Oxygen Delivery: room air  DISCHARGE LOCATION:   Inpatient psychiatry unit  If you experience worsening of your admission symptoms, develop shortness of breath, life threatening emergency, suicidal or homicidal thoughts you must seek medical attention immediately by calling 911 or calling your MD immediately  if symptoms less severe.  You Must read complete instructions/literature along with all the possible adverse reactions/side effects for all the Medicines you take and that have been prescribed to you. Take any new Medicines after you have completely understood and accpet all the possible adverse reactions/side effects.   Please note  You were cared for by a hospitalist during your hospital stay. If you have any questions about your discharge medications or the care you received while you were in the hospital after you are discharged, you can call the unit and asked to speak with the hospitalist on call if the hospitalist that took care of you is not available. Once you are discharged,  your primary care physician will handle any further medical issues. Please note that NO REFILLS for any discharge medications will be authorized once you are discharged, as it is imperative that you return to your primary care physician (or establish a relationship with a primary care physician if you do not have one) for your aftercare needs so that they can reassess your need for medications and monitor your lab values.    On the day of Discharge:  VITAL SIGNS:   Blood pressure (!) 159/88, pulse 86, temperature 97.8 F (36.6 C), temperature source Oral, resp. rate 18, height 5\' 6"  (1.676 m), weight 105.5 kg, SpO2 99 %.  PHYSICAL EXAMINATION:    GENERAL:  48 y.o.-year-old obese patient lying in the bed with no acute distress.  EYES: Pupils equal, round, reactive to light and accommodation. No scleral icterus. Extraocular muscles intact.  HEENT: Head atraumatic, normocephalic. Oropharynx and nasopharynx clear.  NECK:  Supple, no jugular venous distention. No thyroid enlargement, no tenderness.  LUNGS: Normal breath sounds bilaterally, minimal right basilar scattered wheezing.  No  rales,rhonchi or crepitation. No use of accessory muscles of respiration.  CARDIOVASCULAR: S1, S2 normal. No murmurs, rubs, or gallops.  ABDOMEN: Soft, nontender, nondistended. Bowel sounds present. No organomegaly or mass.  EXTREMITIES: No pedal edema, cyanosis, or clubbing.  NEUROLOGIC: Cranial nerves II through XII are intact. Muscle strength 5/5 in all extremities. Sensation intact. Gait not checked.  PSYCHIATRIC: The patient is alert and oriented x 3.  Appears depressed, but better than yesterday.  SKIN: No obvious rash, lesion, or ulcer.   DATA REVIEW:   CBC Recent Labs  Lab 07/02/18 0531  WBC 8.4  HGB 7.8*  HCT 26.0*  PLT 203    Chemistries  Recent Labs  Lab 06/30/18 0526 07/01/18 0539  07/02/18 0531  NA 143  --    < > 138  K 3.5  --    < > 3.1*  CL 117*  --    < > 107  CO2 18*  --    < >  22  GLUCOSE 106*  --    < > 101*  BUN 11  --    < > 6  CREATININE 0.89  --    < > 0.62  CALCIUM 6.8*  --    < > 7.7*  MG  --  1.7  --   --   AST 83*  --   --   --   ALT 37  --   --   --   ALKPHOS 46  --   --   --   BILITOT 0.5  --   --   --    < > = values in this interval not displayed.     Microbiology Results  Results for orders placed or performed during the hospital encounter of 06/29/18  Urine culture     Status: None   Collection Time: 06/29/18  6:58 PM  Result Value Ref Range Status   Specimen Description   Final    URINE, RANDOM Performed at [facility], [address] [place], [place] [address]    Special Requests   Final    NONE Performed at [facility], [address], [place], [place] [address]    Culture   Final    NO GROWTH Performed at [facility], [place] [address], [place], [place] [address]    Report Status 07/01/2018 FINAL  Final  MRSA PCR Screening     Status: None   Collection Time: 06/29/18 11:49 PM  Result Value Ref Range Status   MRSA by PCR NEGATIVE NEGATIVE Final    Comment:        The GeneXpert MRSA Assay (FDA approved for NASAL specimens only), is one component of a comprehensive MRSA colonization surveillance program. It is not intended to diagnose MRSA infection nor to guide or monitor treatment for MRSA infections. Performed at [facility], [address] [place], [place] [address]   Culture, respiratory (non-expectorated)     Status: None   Collection Time: 06/30/18  2:21 AM  Result Value Ref Range Status   Specimen Description   Final    TRACHEAL ASPIRATE Performed at [facility], [address], [place],  [address]    Special Requests   Final    Normal Performed at [facility], [place], [place] [address]    Gram Stain   Final    RARE WBC PRESENT,BOTH PMN AND MONONUCLEAR RARE GRAM POSITIVE RODS RARE GRAM POSITIVE COCCI    Culture   Final     FEW  Consistent with normal respiratory flora. Performed at [facility], [place] [address], [place], [place] [address]    Report Status 07/02/2018 FINAL  Final  CULTURE, BLOOD (ROUTINE X 2) w Reflex to ID Panel     Status: None (Preliminary result)   Collection Time: 06/30/18  5:26 AM  Result Value Ref Range Status   Specimen Description BLOOD RIGHT AC  Final   Special Requests   Final    BOTTLES DRAWN AEROBIC AND ANAEROBIC Blood Culture results may not be optimal due to an excessive volume of blood received in culture bottles   Culture   Final    NO GROWTH 2 DAYS Performed at [facility], [address] [place], [place] [address]    Report Status PENDING  Incomplete  CULTURE, BLOOD (ROUTINE X 2) w Reflex to ID Panel     Status: None (Preliminary result)   Collection Time: 06/30/18  5:33 AM  Result Value Ref Range Status   Specimen Description BLOOD RIGHT HAND  Final   Special Requests   Final    BOTTLES DRAWN AEROBIC AND ANAEROBIC Blood Culture adequate volume   Culture   Final    NO GROWTH 2 DAYS Performed at [facility], [address], [place], [place] [address]    Report Status PENDING  Incomplete    RADIOLOGY:  No results found.   Management plans discussed with the patient, family and they are in agreement.  CODE STATUS:     Code Status Orders  (From admission, onward)         Start     Ordered   06/30/18 0028  Full code  Continuous     06/30/18 0027        Code Status History    This patient has a current code status but no historical code status.    Advance Directive Documentation     Most Recent Value  Type of Advance Directive  Living will [pt states she may have DNR in packet at her home]  Pre-existing out of facility DNR order (yellow form or pink MOST form)  -  "MOST" Form in Place?  -      TOTAL TIME TAKING CARE OF THIS PATIENT: 38 minutes.    Gladstone Lighter M.D on 07/02/2018 at 3:26 PM  Between 7am to 6pm  - Pager - [phone]  After 6pm go to [email or web address] - Proofreader  [facility]  Office  [phone]  CC: Primary care physician; Ronnell Freshwater, PA-C   Note: This dictation was prepared with Dragon dictation along with smaller phrase technology. Any transcriptional errors that result from this process are unintentional.
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
## 2018-07-02 NOTE — Progress Notes (Signed)
I oriented the pt to the unit after she had her shower. I took her to the day room to eat dinner. She has called her family and left a message. She is hopeful for a visit from them. Collier Bullock Rn

## 2018-07-03 DIAGNOSIS — F332 Major depressive disorder, recurrent severe without psychotic features: Secondary | ICD-10-CM

## 2018-07-03 DIAGNOSIS — Z5181 Encounter for therapeutic drug level monitoring: Secondary | ICD-10-CM

## 2018-07-03 DIAGNOSIS — F1020 Alcohol dependence, uncomplicated: Secondary | ICD-10-CM

## 2018-07-03 LAB — CBC WITH DIFFERENTIAL/PLATELET
Abs Immature Granulocytes: 0.15 10*3/uL — ABNORMAL HIGH (ref 0.00–0.07)
Basophils Absolute: 0.1 10*3/uL (ref 0.0–0.1)
Basophils Relative: 1 %
Eosinophils Absolute: 0.5 10*3/uL (ref 0.0–0.5)
Eosinophils Relative: 6 %
HCT: 27.4 % — ABNORMAL LOW (ref 36.0–46.0)
Hemoglobin: 8.3 g/dL — ABNORMAL LOW (ref 12.0–15.0)
Immature Granulocytes: 2 %
Lymphocytes Relative: 10 %
Lymphs Abs: 0.8 10*3/uL (ref 0.7–4.0)
MCH: 24.6 pg — ABNORMAL LOW (ref 26.0–34.0)
MCHC: 30.3 g/dL (ref 30.0–36.0)
MCV: 81.1 fL (ref 80.0–100.0)
Monocytes Absolute: 0.5 10*3/uL (ref 0.1–1.0)
Monocytes Relative: 6 %
Neutro Abs: 6.4 10*3/uL (ref 1.7–7.7)
Neutrophils Relative %: 75 %
Platelets: 221 10*3/uL (ref 150–400)
RBC: 3.38 MIL/uL — ABNORMAL LOW (ref 3.87–5.11)
RDW: 15.9 % — ABNORMAL HIGH (ref 11.5–15.5)
WBC: 8.3 10*3/uL (ref 4.0–10.5)
nRBC: 0.7 % — ABNORMAL HIGH (ref 0.0–0.2)

## 2018-07-03 LAB — LIPID PANEL
Cholesterol: 215 mg/dL — ABNORMAL HIGH (ref 0–200)
HDL: 63 mg/dL (ref >40)
LDL Cholesterol: 120 mg/dL — ABNORMAL HIGH (ref 0–99)
Total CHOL/HDL Ratio: 3.4 RATIO
Triglycerides: 159 mg/dL — ABNORMAL HIGH (ref <150)
VLDL: 32 mg/dL (ref 0–40)

## 2018-07-03 LAB — HEMOGLOBIN A1C
Hgb A1c MFr Bld: 5.5 % (ref 4.8–5.6)
Mean Plasma Glucose: 111.15 mg/dL

## 2018-07-03 LAB — TSH: TSH: 5.041 u[IU]/mL — ABNORMAL HIGH (ref 0.350–4.500)

## 2018-07-03 MED ORDER — ADULT MULTIVITAMIN W/MINERALS CH
1.0000 | ORAL_TABLET | Freq: Every day | ORAL | Status: DC
  Administered 2018-07-03 – 2018-07-04 (×2): 1 via ORAL
  Filled 2018-07-03 (×2): qty 1

## 2018-07-03 MED ORDER — LOPERAMIDE HCL 2 MG PO CAPS: 2.0000 mg | ORAL_CAPSULE | ORAL | Status: DC | PRN

## 2018-07-03 MED ORDER — CLONIDINE HCL 0.1 MG PO TABS
0.1000 mg | ORAL_TABLET | Freq: Two times a day (BID) | ORAL | Status: DC | PRN
  Administered 2018-07-03 – 2018-07-04 (×3): 0.1 mg via ORAL
  Filled 2018-07-03 (×4): qty 1

## 2018-07-03 MED ORDER — VITAMIN B-1 100 MG PO TABS
100.0000 mg | ORAL_TABLET | Freq: Every day | ORAL | Status: DC
  Administered 2018-07-04: 100 mg via ORAL
  Filled 2018-07-03: qty 1

## 2018-07-03 MED ORDER — THIAMINE HCL 100 MG/ML IJ SOLN
100.0000 mg | Freq: Once | INTRAMUSCULAR | Status: AC
  Administered 2018-07-03: 100 mg via INTRAMUSCULAR
  Filled 2018-07-03: qty 2

## 2018-07-03 MED ORDER — HYDROXYZINE HCL 25 MG PO TABS
25.0000 mg | ORAL_TABLET | Freq: Four times a day (QID) | ORAL | Status: DC | PRN
  Administered 2018-07-04: 25 mg via ORAL
  Filled 2018-07-03: qty 1

## 2018-07-03 MED ORDER — METOPROLOL SUCCINATE ER 25 MG PO TB24
50.0000 mg | ORAL_TABLET | Freq: Every day | ORAL | Status: DC
  Administered 2018-07-03 – 2018-07-04 (×2): 50 mg via ORAL
  Filled 2018-07-03 (×2): qty 2

## 2018-07-03 MED ORDER — CYANOCOBALAMIN 1000 MCG/ML IJ SOLN
1000.0000 ug | Freq: Every day | INTRAMUSCULAR | Status: DC
  Administered 2018-07-03 – 2018-07-04 (×2): 1000 ug via INTRAMUSCULAR
  Filled 2018-07-03 (×2): qty 1

## 2018-07-03 MED ORDER — LORAZEPAM 1 MG PO TABS
1.0000 mg | ORAL_TABLET | Freq: Four times a day (QID) | ORAL | Status: DC | PRN
  Administered 2018-07-03 – 2018-07-04 (×2): 1 mg via ORAL
  Filled 2018-07-03 (×2): qty 1

## 2018-07-03 MED ORDER — HYDRALAZINE HCL 25 MG PO TABS
25.0000 mg | ORAL_TABLET | Freq: Three times a day (TID) | ORAL | Status: DC
  Administered 2018-07-03 – 2018-07-04 (×4): 25 mg via ORAL
  Filled 2018-07-03 (×6): qty 1

## 2018-07-03 MED ORDER — ALBUTEROL SULFATE HFA 108 (90 BASE) MCG/ACT IN AERS
1.0000 | INHALATION_SPRAY | Freq: Four times a day (QID) | RESPIRATORY_TRACT | Status: DC | PRN
  Administered 2018-07-04 (×2): 2 via RESPIRATORY_TRACT
  Filled 2018-07-03: qty 6.7

## 2018-07-03 MED ORDER — VITAMIN B-12 1000 MCG PO TABS: 1000.0000 ug | ORAL_TABLET | Freq: Every day | ORAL | Status: DC

## 2018-07-03 MED ORDER — ONDANSETRON 4 MG PO TBDP: 4.0000 mg | ORAL_TABLET | Freq: Four times a day (QID) | ORAL | Status: DC | PRN

## 2018-07-03 NOTE — Progress Notes (Signed)
BP continues to be elevated, Dr. Nicolasa Ducking made ware. Per MD, "Give another Clonidine". Patient given medication, will inform on coming shift of the acute situation.
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
## 2018-07-03 NOTE — Consult Note (Signed)
Patient Demographics  Lisa Vasquez, is a 48 y.o. female   MRN: [id number]   DOB - Sep 10, 1970  Admit Date - 07/02/2018    Outpatient Primary MD for the patient is Ronnell Freshwater, PA-C  Consult requested in the Hospital by Clapacs, Madie Reno, MD, On 07/03/2018    Reason for consult ; management of hypertension  Chief complaint: 48 year old female patient who was discharged from medical unit yesterday after she was admitted for drug overdose, intubated, extubated, has major depression and suicidal ideation, transferred to psych unit, called by psychiatrist this morning for management of hypertension, BP elevated to 184/107.  Patient has been started on Toprol-XL 50 mg daily, lisinopril 20 mg daily.  Patient told me that she takes this medicine before she is admitted at this time.  Has some shortness of breath but denies any other complaints.   Past Medical History:  Diagnosis Date  . Anemia   . Hypertension   . Skin cancer       Past Surgical History:  Procedure Laterality Date  . CERVICAL ABLATION    . EYE SURGERY    . NASAL SINUS SURGERY  2001  . STRABISMUS SURGERY Right   . WISDOM TOOTH EXTRACTION            Review of Systems    In addition to the HPI above,  No Fever-chills, No Headache, No changes with Vision or hearing, No problems swallowing food or Liquids, No Chest pain, Cough has some shortness of breath. No Abdominal pain, No Nausea or Vommitting, Bowel movements are regular, No Blood in stool or Urine, No dysuria, No new skin rashes or bruises, No new joints pains-aches,  No new weakness, tingling, numbness in any extremity, No recent weight gain or loss, No polyuria, polydypsia or polyphagia, No significant Mental Stressors.  A full 10 point Review of Systems was done, except as stated  above, all other Review of Systems were negative.   Social History Social History   Tobacco Use  . Smoking status: Former Research scientist (life sciences)  . Smokeless tobacco: Never Used  Substance Use Topics  . Alcohol use: Yes    Comment: "too much"    Family History Family History  Problem Relation Age of Onset  . COPD Mother   . Skin cancer Mother   . Heart defect Mother   . Heart attack Maternal Aunt   . Heart attack Maternal Uncle   . Breast cancer Maternal Grandmother   . Melanoma Maternal Uncle      Prior to Admission medications   Medication Sig Start Date End Date Taking? Authorizing Provider  albuterol (PROVENTIL HFA;VENTOLIN HFA) 108 (90 Base) MCG/ACT inhaler Inhale 2 puffs into the lungs every 6 (six) hours as needed for wheezing or shortness of breath. 07/02/18   Gladstone Lighter, MD  azithromycin (ZITHROMAX) 250 MG tablet For 4 days 07/03/18 07/06/18  Gladstone Lighter, MD  cyclobenzaprine (FLEXERIL) 10 MG tablet Take 10 mg by mouth daily. 12/18/17   [provider]  fluticasone (FLONASE) 50 MCG/ACT nasal spray PLACE 1 SPRAY IN EACH NOSTRIL D 09/01/16   [provider]  lisinopril (PRINIVIL,ZESTRIL) 20 MG tablet Take 1 tablet (20 mg total) by mouth daily. 07/02/18   Gladstone Lighter, MD  lurasidone (LATUDA) 40 MG TABS tablet Take 1 tablet (40 mg total) by mouth daily with breakfast. 07/02/18   Gladstone Lighter, MD  metoprolol succinate (TOPROL-XL) 50 MG 24 hr tablet Take 1 tablet (50 mg total) by mouth daily. Take with or immediately following a meal. 07/02/18   Gladstone Lighter, MD  pantoprazole (PROTONIX) 40 MG tablet Take 1 tablet (40 mg total) by mouth 2 (two) times daily before a meal. 07/02/18   Gladstone Lighter, MD  rizatriptan (MAXALT) 10 MG tablet Take 10 mg by mouth as needed for migraine. May repeat in 2 hours if needed    [provider]  simvastatin (ZOCOR) 40 MG tablet Take 40 mg by mouth daily.    [provider]    Anti-infectives  (From admission, onward)   Start     Dose/Rate Route Frequency Ordered Stop   07/03/18 0800  azithromycin (ZITHROMAX) tablet 250 mg     250 mg Oral Daily 07/02/18 1703 07/07/18 0759      Scheduled Meds: . azithromycin  250 mg Oral Daily  . cyanocobalamin  1,000 mcg Intramuscular Daily  . lisinopril  20 mg Oral Daily  . lurasidone  40 mg Oral Q breakfast  . metoprolol succinate  50 mg Oral Daily  . multivitamin with minerals  1 tablet Oral Daily  . sertraline  100 mg Oral Daily  . thiamine  100 mg Intramuscular Once  . [START ON 07/04/2018] thiamine  100 mg Oral Daily  . [START ON 07/06/2018] vitamin B-12  1,000 mcg Oral Daily   Continuous Infusions: PRN Meds:.acetaminophen, alum & mag hydroxide-simeth, cloNIDine, hydrOXYzine, loperamide, LORazepam, magnesium hydroxide, traZODone  Allergies  Allergen Reactions  . Ondansetron Other (See Comments)    Constipation     Physical Exam  Vitals  Blood pressure (!) 177/101, pulse 88, temperature 98.1 F (36.7 C), temperature source Oral, resp. rate 18, height 5\' 6"  (1.676 m), weight 108.9 kg, SpO2 96 %.   1.  Alert, awake, oriented.  Seen in psychiatric ward. 2. Normal affect and insight, Not Suicidal or Homicidal, Awake Alert, Oriented X 3.  3. No F.N deficits, ALL C.Nerves Intact, Strength 5/5 all 4 extremities, Sensation intact all 4 extremities, Plantars down going.  4. Ears and Eyes appear Normal, Conjunctivae clear, PERRLA. Moist Oral Mucosa.  5. Supple Neck, No JVD, No cervical lymphadenopathy appriciated, No Carotid Bruits.  6. Symmetrical Chest wall movement, Good air movement bilaterally, CTAB.  7. RRR, No Gallops, Rubs or Murmurs, No Parasternal Heave.  8. Positive Bowel Sounds, Abdomen Soft, No tenderness, No organomegaly appriciated,No rebound -guarding or rigidity.  9.  No Cyanosis, Normal Skin Turgor, No Skin Rash or Bruise.  10. Good muscle tone,  joints appear normal , no effusions, Normal ROM.  11. No  Palpable Lymph Nodes in Neck or Axillae   Data Review  CBC Recent Labs  Lab 06/29/18 1858 06/30/18 0526 07/01/18 0543 07/01/18 1046 07/02/18 0531 07/03/18 1130  WBC 12.4* 11.9* 10.3 11.1* 8.4 8.3  HGB 10.0* 7.6* 7.1* 7.2* 7.8* 8.3*  HCT 34.4* 26.4* 24.7* 24.5* 26.0* 27.4*  PLT 471* 260 202 221 203 221  MCV 80.2 82.8 81.3 80.1 80.2 81.1  MCH 23.3* 23.8* 23.4* 23.5* 24.1* 24.6*  MCHC 29.1* 28.8* 28.7*  29.4* 30.0 30.3  RDW 15.9* 15.7* 15.9* 15.8* 15.3 15.9*  LYMPHSABS 4.6*  --   --   --   --  0.8  MONOABS 0.7  --   --   --   --  0.5  EOSABS 1.7*  --   --   --   --  0.5  BASOSABS 0.2*  --   --   --   --  0.1   ------------------------------------------------------------------------------------------------------------------  Chemistries  Recent Labs  Lab 06/29/18 1858 06/30/18 0526 07/01/18 0539 07/01/18 0543 07/01/18 1457 07/02/18 0531  NA 140 143  --  139  --  138  K 3.9 3.5  --  2.9* 3.2* 3.1*  CL 107 117*  --  108  --  107  CO2 20* 18*  --  23  --  22  GLUCOSE 112* 106*  --  104*  --  101*  BUN 10 11  --  7  --  6  CREATININE 0.74 0.89  --  0.73  --  0.62  CALCIUM 8.3* 6.8*  --  7.2*  --  7.7*  MG  --   --  1.7  --   --   --   AST 99* 83*  --   --   --   --   ALT 40 37  --   --   --   --   ALKPHOS 64 46  --   --   --   --   BILITOT 0.3 0.5  --   --   --   --    ------------------------------------------------------------------------------------------------------------------ estimated creatinine clearance is 106.2 mL/min (by C-G formula based on SCr of 0.62 mg/dL). ------------------------------------------------------------------------------------------------------------------ Recent Labs    07/03/18 0638  TSH 5.041*     Coagulation profile No results for input(s): INR, PROTIME in the last 168 hours. ------------------------------------------------------------------------------------------------------------------- No results for input(s): DDIMER in the  last 72 hours. -------------------------------------------------------------------------------------------------------------------  Cardiac Enzymes No results for input(s): CKMB, TROPONINI, MYOGLOBIN in the last 168 hours.  Invalid input(s): CK ------------------------------------------------------------------------------------------------------------------ Invalid input(s): POCBNP   ---------------------------------------------------------------------------------------------------------------  Urinalysis    Component Value Date/Time   COLORURINE STRAW (A) 06/29/2018 1858   APPEARANCEUR HAZY (A) 06/29/2018 1858   LABSPEC 1.005 06/29/2018 1858   PHURINE 5.0 06/29/2018 1858   GLUCOSEU NEGATIVE 06/29/2018 1858   HGBUR NEGATIVE 06/29/2018 1858   BILIRUBINUR NEGATIVE 06/29/2018 1858   KETONESUR NEGATIVE 06/29/2018 1858   PROTEINUR NEGATIVE 06/29/2018 1858   NITRITE NEGATIVE 06/29/2018 1858   LEUKOCYTESUR NEGATIVE 06/29/2018 1858     Imaging results:   No results found.    Assessment & Plan  Active Problems:   Severe recurrent major depression without psychotic features (HCC)   Alcohol use disorder, moderate, dependence ([place])    1.  Accelerated hypertension likely secondary to not getting her home medicines, continue metoprolol succinate 50 mg daily, lisinopril 20 mg daily, I will add hydralazine 25 mg 3 times daily for now until her blood pressure stabilizes, we will make further recommendation based on her blood pressure response. Recent admission to medical unit for respiratory failure secondary to drug overdose, pneumonia, now she is not hypoxic, continue azithromycin. 3.  History of major depression, continue management as per psychiatrist,    AM Labs Ordered, also please review Full Orders  Family Communication: Plan discussed with patient .  Time spent;50 min  Thank you for the consult, we will follow the patient with you in the [facility]  M.D on 07/03/2018 at 2:19 PM  Note: This dictation was prepared with Dragon dictation along with smaller phrase technology. Any transcriptional errors that result from this process are unintentional.

## 2018-07-03 NOTE — Plan of Care (Signed)
Patient present in the milieu with a steady gait. Denies having any thoughts of harming herself but rates her depression and hopelessness a 7 and her anxiety a 9. Patient also stated that she is withdrawing from alcohol, order for CIWA protocol in place to monitor withdrawal symptoms. Adequate po intake, meals eaten in the dayroom with appropriate peer interaction noted. Milieu remains safe with q 15 minute safety checks. Will continue to monitor.

## 2018-07-03 NOTE — BHH Suicide Risk Assessment (Signed)
[facility] Admission Suicide Risk Assessment   Nursing information obtained from:  Patient Demographic factors:  Caucasian Current Mental Status:  Suicidal ideation indicated by patient Loss Factors:  Decrease in vocational status, Financial problems / change in socioeconomic status, Legal issues Historical Factors:  Victim of physical or sexual abuse Risk Reduction Factors:  Sense of responsibility to family, Living with another person, especially a relative, Positive social support  Total Time spent with patient: 45 minutes Principal Problem: Major Depression Diagnosis:  Active Problems:   Severe recurrent major depression without psychotic features ([place])  Subjective Data:   [name] is a 48 year old married Caucasian female with a prior diagnosis of bipolar disorder who was admitted to the ICU status post overdose on Klonopin per the patient in the context of a number of different stressors and possible mania.  She did see a psychiatrist over 10 years ago and was on medications for bipolar disorder in the past but has been off psychotropic medications other than Zoloft in the past several years.  She does admit to problems with major mood swings including hypomanic and manic episodes with racing thoughts, decreased sleep with increased goal-directed behavior, spending sprees, and insomnia.  The patient says she will clean excessively at home when she gets manic.  These episodes do alternate with some depression including feelings of hopelessness, frequent crying spells and anhedonia.  Recently, the patient became more depressed in November and December after she resigned from her job.  The patient resigned because she had stolen credit cards from her colleagues and went on a spending spree in the context of mania.  The patient has multiple pending charges.  As a result of losing her job, she and her husband and daughter had to move to an apartment.  They also had to give up their dog because they  moved to an apartment.  She denies any current active suicidal thoughts but did endorse suicidal thoughts at the time of the overdose.  She denies any current active or passive homicidal thoughts.  No auditory or visual hallucinations.  No paranoid thoughts or delusions currently.  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  She currently drinks approximately 750 cc of wine per night.  She denies any illicit drug use.  She has been married now for over 40 years and has 3 year old daughter.  She lives with her husband and daughter in [place].  She has been unemployed since December.   Past psychiatric history: The patient denies any prior inpatient psychiatric hospitalizations or suicide attempts.  She was seeing a psychiatrist at [facility] in [place] over 10 years ago.  She cannot remember any prior mood stabilizers but has been on Zoloft from her PCP for several years.  Social History: The patient was born at and raised in [place], [place] by her mother and stepfather as she did not know her biological father.  She did have a close relationship with her parents and denies any history of any physical or sexual abuse.  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  She has been married for over 50 years and has one 4 year old daughter.  She and her husband and daughter live in [place].   Substance abuse history: The patient was born at and  raised in [place], [place] by her mother and stepfather as she did not know her biological father.  She did have a close relationship with her parents and denies any history of any physical or sexual abuse.  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  She has been married for over 50 years and has one 69 year old daughter.  She and her husband and daughter live in [place].   Substance abuse history: The patient has been drinking 750 cc of wine per day for 20 years.  She denies any history of any cannabis, cocaine, opioid or stimulant use.  She quit smoking cigarettes in 2004  Family psychiatric history: The patient's daughter has bipolar disorder type II and sees a psychiatrist  Legal history: She has multiple pending charges for theft and fraud      Continued Clinical Symptoms:  Alcohol Use Disorder Identification Test Final Score (AUDIT): 26 The "Alcohol Use Disorders Identification Test", Guidelines for Use in Primary Care, Second Edition.  World Pharmacologist [facility]). Score between 0-7:  no or low risk or alcohol related problems. Score between 8-15:  moderate risk of alcohol related problems. Score between 16-19:  high risk of alcohol related problems. Score 20 or above:  warrants further diagnostic evaluation for alcohol dependence and treatment.   CLINICAL FACTORS:   Severe Anxiety and/or Agitation Depression:   Anhedonia Comorbid alcohol abuse/dependence Hopelessness Insomnia Recent sense of peace/wellbeing Severe Alcohol/Substance Abuse/Dependencies More than one psychiatric diagnosis Previous Psychiatric Diagnoses and Treatments   Musculoskeletal: See H+P  Psychiatric Specialty Exam: Physical Exam: See H+P  ROS" see H+P  Blood pressure (!) 177/101, pulse 95, temperature 98.1 F (36.7 C), temperature source Oral, resp. rate 18, height 5\' 6"  (1.676 m), weight 108.9 kg, SpO2 96 %.Body mass index is 38.74 kg/m.  See H+P                                                        COGNITIVE FEATURES THAT CONTRIBUTE TO RISK:  Alcohol use; manic symptoms   SUICIDE  RISK:   Mild:  Suicidal ideation of limited frequency, intensity, duration, and specificity.  There are no identifiable plans, no associated intent, mild dysphoria and related symptoms, good self-control (both objective and subjective assessment), few other risk factors, and identifiable protective factors, including available and accessible social support. She denies access to guns  PLAN OF CARE:    Bipolar disorder, type I: MRE Depressed Alcohol use disorder, moderate to severe Hypertension Hyperlipidemia GERD Severe: Legal charges, unemployed, financial problems  Ms. Hebb is a 48 year old married Caucasian female with history of bipolar disorder who has been off of mood stabilizer now for over 10 years.  She overdosed intentionally secondary to suicidal thoughts in the context of a number of different stressors and is now status post extubation.  She will be admitted to inpatient psychiatry for medication management, safety and stabilization.   Bipolar disorder, type I: The patient will be started on Latuda 40 mg p.o. daily with dinner for mood stabilization and will continue Zoloft 100 mg p.o. daily for depression Hemoglobin A1c is pending.  Total cholesterol was  215 TSH was 5.0 and will repeat We will check EKG to rule out QTc prolongation  Alcohol use disorder: Alcohol level was 318 in the emergency room.  She was advised to abstain from alcohol and all illicit drugs worsen mood symptoms.  We will place the patient on Ativan per CIWA as well as given multivitamin, thiamine and folic acid.  Will encourage the patient to enter a meaningful recovery program at the time of discharge  Anemia: Hemoglobin A1c of 7.8.  She was already given blood transfusions on medicine floor will monitor hemoglobin.  Low B12: Low B12 may be contributing to depression.  The patient also has severe anemia.  We will give B12 injections daily for 3 days and then oral B12 supplements  Hypertension: Blood  pressures have been elevated.  We will consult with medicine as they have been elevated now for several days.  We will provide clonidine as needed.  Will continue on Toprol-XL 50 mg p.o. daily and lisinopril 20 mg p.o. daily  Hyperlipidemia: We will continue simvastatin 40 mg p.o. daily  Recent acute respiratory failure: We will continue Flonase and albuterol  Disposition: The patient does have a stable living situation but will need psychotropic medication management at the time of follow-up I certify that inpatient services furnished can reasonably be expected to improve the patient's condition.   Chauncey Mann, MD 07/03/2018, 1:21 PM

## 2018-07-03 NOTE — H&P (Addendum)
Psychiatric Admission Assessment Adult  Patient Identification: Lisa Vasquez MRN:  [id number] Date of Evaluation:  07/03/2018 Chief Complaint:  Depression Principal Diagnosis: Depression  Diagnosis:  Active Problems:   Severe recurrent major depression without psychotic features ([place])  History of Present Illness:  Ms. Consepcion Vasquez is a 48 year old married Caucasian female with a prior diagnosis of bipolar disorder who was admitted to the ICU status post overdose on Klonopin per the patient in the context of a number of different stressors and possible mania.  She did see a psychiatrist over 10 years ago and was on medications for bipolar disorder in the past but has been off psychotropic medications other than Zoloft in the past several years.  She does admit to problems with major mood swings including hypomanic and manic episodes with racing thoughts, decreased sleep with increased goal-directed behavior, spending sprees, and insomnia.  The patient says she will clean excessively at home when she gets manic.  These episodes do alternate with some depression including feelings of hopelessness, frequent crying spells and anhedonia.  Recently, the patient became more depressed in November and December after she resigned from her job.  The patient resigned because she had stolen credit cards from her colleagues and went on a spending spree in the context of mania.  The patient has multiple pending charges.  As a result of losing her job, she and her husband and daughter had to move to an apartment.  They also had to give up their dog because they moved to an apartment.  She denies any current active suicidal thoughts but did endorse suicidal thoughts at the time of the overdose.  She denies any current active or passive homicidal thoughts.  No auditory or visual hallucinations.  No paranoid thoughts or delusions currently.  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  She  currently drinks approximately 750 cc of wine per night.  She denies any illicit drug use.  She has been married now for over 19 years and has 6 year old daughter.  She lives with her husband and daughter in [place].  She has been unemployed since December.  Past psychiatric history: The patient denies any prior inpatient psychiatric hospitalizations or suicide attempts.  She was seeing a psychiatrist at [facility] in [place] over 10 years ago.  She cannot remember any prior mood stabilizers but has been on Zoloft from her PCP for several years.  Social History: The patient was born at and raised in [place], [place] by her mother and stepfather as she did not know her biological father.  She did have a close relationship with her parents and denies any history of any physical or sexual abuse.  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  She has been married for over 92 years and has one 74 year old daughter.  She and her husband and daughter live in [place].  Substance abuse history: The patient was born at and raised in [place], [place] by her mother and stepfather as she did not know her biological father.  She did have a close relationship with her parents and denies any history of any physical or sexual abuse.  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  She has been married for over 3 years and has one 51 year old daughter.  She and her husband and daughter live in [place].  Substance abuse history: The patient has been drinking 750 cc of wine per day for 20 years.  She denies any history of  any cannabis, cocaine, opioid or stimulant use.  She quit smoking cigarettes in 2004  Family psychiatric history: The patient's daughter has bipolar disorder type II and sees a psychiatrist  Legal history: She has multiple pending charges for theft and fraud    Associated Signs/Symptoms: Depression Symptoms:  depressed mood, anhedonia, insomnia, fatigue, feelings of worthlessness/guilt, suicidal attempt, anxiety, (Hypo) Manic Symptoms: None currently Anxiety Symptoms:  Excessive Worry, Worry over legal charges Psychotic Symptoms:  None PTSD Symptoms: None Total Time spent with patient: 45 minutes   Is the patient at risk to self? Yes.    Has the patient been a risk to self in the past 6 months? Yes.    Has the patient been a risk to self within the distant past? Yes.    Is the patient a risk to others? No.  Has the patient been a risk to others in the past 6 months? No.  Has the patient been a risk to others within the distant past? No.   Prior Inpatient Therapy:  No Prior Outpatient Therapy:  Yes  Alcohol Screening: 1. How often do you have a drink containing alcohol?: 4 or more times a week 2. How many drinks containing alcohol do you have on a typical day when you are drinking?: 5 or 6 3. How often do you have six or more drinks on one occasion?: Daily or almost daily AUDIT-C Score: 10 4. How often during the last year have you found that you were not able to stop drinking once you had started?: Weekly 5. How often during the last year have you failed to do what was normally expected from you becasue of drinking?: Weekly 6. How often during the last year have you needed a first drink in the morning to get yourself going after a heavy drinking session?: Never 7. How often during the last year have you had a feeling of guilt of remorse after drinking?: Weekly 8. How often during the last year have you been unable to remember what happened the night before because you had  been drinking?: Weekly 9. Have you or someone else been injured as a result of your drinking?: No 10. Has a relative or friend or a doctor or another health worker been concerned about your drinking or suggested you cut down?: Yes, during the last year Alcohol Use Disorder Identification Test Final Score (AUDIT): 26 Alcohol Brief Interventions/Follow-up: Continued Monitoring Substance Abuse History in the last 12 months:  Yes.   Consequences of Substance Abuse: Legal Consequences:  Pending charges Previous Psychotropic Medications: Yes  Psychological Evaluations: Yes  Past Medical History:  Past Medical History:  Diagnosis Date  . Anemia   . Hypertension   . Skin cancer     Past Surgical History:  Procedure Laterality Date  . CERVICAL ABLATION    . EYE SURGERY    . NASAL SINUS SURGERY  2001  . STRABISMUS SURGERY Right   . WISDOM TOOTH EXTRACTION     Family History:  Family History  Problem Relation Age of Onset  . COPD Mother   .  Skin cancer Mother   . Heart defect Mother   . Heart attack Maternal Aunt   . Heart attack Maternal Uncle   . Breast cancer Maternal Grandmother   . Melanoma Maternal Uncle     Tobacco Screening: Have you used any form of tobacco in the last 30 days? (Cigarettes, Smokeless Tobacco, Cigars, and/or Pipes): No Social History:  Social History   Substance and Sexual Activity  Alcohol Use Yes   Comment: "too much"     Social History   Substance and Sexual Activity  Drug Use Not Currently  . Types: Cocaine    Additional Social History:                           Allergies:   Allergies  Allergen Reactions  . Ondansetron Other (See Comments)    Constipation    Lab Results:  Results for orders placed or performed during the hospital encounter of 07/02/18 (from the past 48 hour(s))  Lipid panel     Status: Abnormal   Collection Time: 07/03/18  6:38 AM  Result Value Ref Range   Cholesterol 215 (H) 0 - 200 mg/dL   Triglycerides  159 (H) <150 mg/dL   HDL 63 >40 mg/dL   Total CHOL/HDL Ratio 3.4 RATIO   VLDL 32 0 - 40 mg/dL   LDL Cholesterol 120 (H) 0 - 99 mg/dL    Comment:        Total Cholesterol/HDL:CHD Risk Coronary Heart Disease Risk Table                     Men   Women  1/2 Average Risk   3.4   3.3  Average Risk       5.0   4.4  2 X Average Risk   9.6   7.1  3 X Average Risk  23.4   11.0        Use the calculated Patient Ratio above and the CHD Risk Table to determine the patient's CHD Risk.        ATP III CLASSIFICATION (LDL):  <100     mg/dL   Optimal  100-129  mg/dL   Near or Above                    Optimal  130-159  mg/dL   Borderline  160-189  mg/dL   High  >190     mg/dL   Very High Performed at [facility], [address] [place], [place] [address]   TSH     Status: Abnormal   Collection Time: 07/03/18  6:38 AM  Result Value Ref Range   TSH 5.041 (H) 0.350 - 4.500 uIU/mL    Comment: Performed by a 3rd Generation assay with a functional sensitivity of <=0.01 uIU/mL. Performed at [facility], [address] [place], [place] [address]   CBC with Differential/Platelet     Status: Abnormal   Collection Time: 07/03/18 11:30 AM  Result Value Ref Range   WBC 8.3 4.0 - 10.5 K/uL   RBC 3.38 (L) 3.87 - 5.11 MIL/uL   Hemoglobin 8.3 (L) 12.0 - 15.0 g/dL   HCT 27.4 (L) 36.0 - 46.0 %   MCV 81.1 80.0 - 100.0 fL   MCH 24.6 (L) 26.0 - 34.0 pg   MCHC 30.3 30.0 - 36.0 g/dL   RDW 15.9 (H) 11.5 - 15.5 %   Platelets 221 150 -  400 K/uL   nRBC 0.7 (H) 0.0 - 0.2 %   Neutrophils Relative % 75 %   Neutro Abs 6.4 1.7 - 7.7 K/uL   Lymphocytes Relative 10 %   Lymphs Abs 0.8 0.7 - 4.0 K/uL   Monocytes Relative 6 %   Monocytes Absolute 0.5 0.1 - 1.0 K/uL   Eosinophils Relative 6 %   Eosinophils Absolute 0.5 0.0 - 0.5 K/uL   Basophils Relative 1 %   Basophils Absolute 0.1 0.0 - 0.1 K/uL   Immature Granulocytes 2 %   Abs Immature Granulocytes 0.15 (H) 0.00 - 0.07 K/uL    Comment:  Performed at [facility], [address] [place], [place] [address]    Blood Alcohol level:  Lab Results  Component Value Date   ETH [facility]) 76/28/3151    Metabolic Disorder Labs:  No results found for: HGBA1C, MPG No results found for: PROLACTIN Lab Results  Component Value Date   CHOL 215 (H) 07/03/2018   TRIG 159 (H) 07/03/2018   HDL 63 07/03/2018   CHOLHDL 3.4 07/03/2018   VLDL 32 07/03/2018   LDLCALC 120 (H) 07/03/2018    Current Medications: Current Facility-Administered Medications  Medication Dose Route Frequency Provider Last Rate Last Dose  . acetaminophen (TYLENOL) tablet 650 mg  650 mg Oral Q6H PRN Clapacs, Madie Reno, MD   650 mg at 07/03/18 0120  . alum & mag hydroxide-simeth (MAALOX/MYLANTA) 200-200-20 MG/5ML suspension 30 mL  30 mL Oral Q4H PRN Clapacs, John T, MD      . azithromycin (ZITHROMAX) tablet 250 mg  250 mg Oral Daily Clapacs, Madie Reno, MD   250 mg at 07/03/18 7616  . cloNIDine (CATAPRES) tablet 0.1 mg  0.1 mg Oral BID PRN Chauncey Mann, MD   0.1 mg at 07/03/18 1245  . cyanocobalamin ((VITAMIN B-12)) injection 1,000 mcg  1,000 mcg Intramuscular Daily Chauncey Mann, MD   1,000 mcg at 07/03/18 1247  . hydrOXYzine (ATARAX/VISTARIL) tablet 25 mg  25 mg Oral Q6H PRN Chauncey Mann, MD      . lisinopril (PRINIVIL,ZESTRIL) tablet 20 mg  20 mg Oral Daily Clapacs, Madie Reno, MD   20 mg at 07/03/18 0829  . loperamide (IMODIUM) capsule 2-4 mg  2-4 mg Oral PRN Chauncey Mann, MD      . LORazepam (ATIVAN) tablet 1 mg  1 mg Oral Q6H PRN Chauncey Mann, MD      . lurasidone (LATUDA) tablet 40 mg  40 mg Oral Q breakfast Clapacs, Madie Reno, MD   40 mg at 07/03/18 0829  . magnesium hydroxide (MILK OF MAGNESIA) suspension 30 mL  30 mL Oral Daily PRN Clapacs, John T, MD      . metoprolol succinate (TOPROL-XL) 24 hr tablet 50 mg  50 mg Oral Daily Chauncey Mann, MD   50 mg at 07/03/18 1139  . multivitamin with minerals tablet 1 tablet  1 tablet Oral Daily Chauncey Mann,  MD      . sertraline (ZOLOFT) tablet 100 mg  100 mg Oral Daily Clapacs, Madie Reno, MD   100 mg at 07/03/18 0828  . thiamine (B-1) injection 100 mg  100 mg Intramuscular Once Chauncey Mann, MD      . Derrill Memo ON 07/04/2018] thiamine (VITAMIN B-1) tablet 100 mg  100 mg Oral Daily Chauncey Mann, MD      . traZODone (DESYREL) tablet 100 mg  100 mg Oral QHS PRN Clapacs, Madie Reno,  MD   100 mg at 07/03/18 0119  . [START ON 07/06/2018] vitamin B-12 (CYANOCOBALAMIN) tablet 1,000 mcg  1,000 mcg Oral Daily Chauncey Mann, MD       PTA Medications: Medications Prior to Admission  Medication Sig Dispense Refill Last Dose  . albuterol (PROVENTIL HFA;VENTOLIN HFA) 108 (90 Base) MCG/ACT inhaler Inhale 2 puffs into the lungs every 6 (six) hours as needed for wheezing or shortness of breath. 1 Inhaler 2   . azithromycin (ZITHROMAX) 250 MG tablet For 4 days 4 each 0   . cyclobenzaprine (FLEXERIL) 10 MG tablet Take 10 mg by mouth daily.  1   . fluticasone (FLONASE) 50 MCG/ACT nasal spray PLACE 1 SPRAY IN EACH NOSTRIL D  1 Taking  . lisinopril (PRINIVIL,ZESTRIL) 20 MG tablet Take 1 tablet (20 mg total) by mouth daily. 30 tablet 2   . lurasidone (LATUDA) 40 MG TABS tablet Take 1 tablet (40 mg total) by mouth daily with breakfast. 30 tablet    . metoprolol succinate (TOPROL-XL) 50 MG 24 hr tablet Take 1 tablet (50 mg total) by mouth daily. Take with or immediately following a meal. 30 tablet 2   . pantoprazole (PROTONIX) 40 MG tablet Take 1 tablet (40 mg total) by mouth 2 (two) times daily before a meal. 30 tablet 3   . rizatriptan (MAXALT) 10 MG tablet Take 10 mg by mouth as needed for migraine. May repeat in 2 hours if needed   Taking  . simvastatin (ZOCOR) 40 MG tablet Take 40 mg by mouth daily.   Taking    Musculoskeletal: Strength & Muscle Tone: within normal limits Gait & Station: normal Patient leans: N/A  Psychiatric Specialty Exam: Physical Exam  Nursing note and vitals reviewed. Constitutional: She is  oriented to person, place, and time. She appears well-developed and well-nourished.  HENT:  Head: Normocephalic and atraumatic.  Eyes: Pupils are equal, round, and reactive to light. Conjunctivae and EOM are normal.  Neck: Normal range of motion. Neck supple. Thyromegaly present.  Cardiovascular: Normal rate, regular rhythm and normal heart sounds.  No murmur heard. Respiratory: Effort normal. No respiratory distress. She has no wheezes.  GI: Soft. Bowel sounds are normal. She exhibits no distension and no mass. There is no abdominal tenderness.  Musculoskeletal: Normal range of motion.        General: No edema.  Neurological: She is alert and oriented to person, place, and time. She has normal reflexes.  Skin: Skin is warm and dry. No rash noted. No erythema.    Review of Systems  Constitutional: Negative.   HENT: Negative.   Eyes: Negative.   Respiratory: Negative.   Cardiovascular: Negative.   Gastrointestinal: Negative.   Musculoskeletal: Negative.   Skin: Negative.   Neurological: Negative.   Endo/Heme/Allergies: Negative.     Blood pressure (!) 177/101, pulse 95, temperature 98.1 F (36.7 C), temperature source Oral, resp. rate 18, height 5\' 6"  (1.676 m), weight 108.9 kg, SpO2 96 %.Body mass index is 38.74 kg/m.  General Appearance: Casual  Eye Contact:  Good  Speech:  Clear and Coherent and Normal Rate  Volume:  Normal  Mood:  Depressed  Affect:  Tearful  Thought Process:  Coherent, Goal Directed and Linear  Orientation:  Full (Time, Place, and Person)  Thought Content:  Logical; goal directed  Suicidal Thoughts:  No  Homicidal Thoughts:  No  Memory:  Immediate;   Fair Recent;   Fair Remote;   Fair  Judgement:  Impaired  Insight:  Fair  Psychomotor Activity:  Normal  Concentration:  Concentration: Fair and Attention Span: Fair  Recall:  [employer] of Knowledge:  Good  Language:  Good  Akathisia:  No  Handed:  Right  AIMS (if indicated):     Assets:   Communication Skills Desire for Improvement Financial Resources/Insurance Housing Physical Health Transportation Vocational/Educational  ADL's:  Intact  Cognition:  WNL  Sleep:  Number of Hours: 6.15    Treatment Plan Summary:  Bipolar disorder, type I: MRE Depressed Alcohol use disorder, moderate to severe Hypertension Hyperlipidemia GERD Severe: Legal charges, unemployed, financial problems  Ms. Karle Starch is a 48 year old married Caucasian female with history of bipolar disorder who has been off of mood stabilizer now for over 10 years.  She overdosed intentionally secondary to suicidal thoughts in the context of a number of different stressors and is now status post extubation.  She will be admitted to inpatient psychiatry for medication management, safety and stabilization.   Bipolar disorder, type I: The patient will be started on Latuda 40 mg p.o. daily with dinner for mood stabilization and will continue Zoloft 100 mg p.o. daily for depression Hemoglobin A1c is pending.  Total cholesterol was 215 TSH was 5.0 and will repeat We will check EKG to rule out QTc prolongation  Alcohol use disorder: Alcohol level was 318 in the emergency room.  She was advised to abstain from alcohol and all illicit drugs worsen mood symptoms.  We will place the patient on Ativan per CIWA as well as given multivitamin, thiamine and folic acid.  Will encourage the patient to enter a meaningful recovery program at the time of discharge  Pneumonia: Continue Zithromax 250mg  po daily. Medicine will be following pateint.  Anemia: Hemoglobin A1c of 7.8.  She was already given blood transfusions on medicine floor will monitor hemoglobin.  Low B12: Low B12 may be contributing to depression.  The patient also has severe anemia.  We will give B12 injections daily for 3 days and then oral B12 supplements  Hypertension: Blood pressures have been elevated.  We will consult with medicine as they have been  elevated now for several days.  We will provide clonidine as needed.  Will continue on Toprol-XL 50 mg p.o. daily and lisinopril 20 mg p.o. daily  Hyperlipidemia: We will continue simvastatin 40 mg p.o. daily  Recent acute respiratory failure: We will continue Flonase and albuterol  Disposition: The patient does have a stable living situation but will need psychotropic medication management at the time of follow-up   Daily contact with patient to assess and evaluate symptoms and progress in treatment and Medication management                   Physician Treatment Plan for Primary Diagnosis: Long Term Goal(s): Improvement in symptoms so as ready for discharge  Short Term Goals: Ability to identify changes in lifestyle to reduce recurrence of condition will improve, Ability to verbalize feelings will improve, Ability to demonstrate self-control will improve, Ability to identify and develop effective coping behaviors will improve, Ability to maintain clinical measurements within normal limits will improve, Compliance with prescribed medications will improve and Ability to identify triggers associated with substance abuse/mental health issues will improve  Physician Treatment Plan for Secondary Diagnosis: Active Problems:   Severe recurrent major depression without psychotic features ([place])  Long Term Goal(s): Improvement in symptoms so as ready for discharge  Short Term Goals: Ability to identify changes in lifestyle to reduce recurrence of  condition will improve, Ability to verbalize feelings will improve, Ability to demonstrate self-control will improve, Ability to identify and develop effective coping behaviors will improve, Ability to maintain clinical measurements within normal limits will improve, Compliance with prescribed medications will improve and Ability to identify triggers associated with substance abuse/mental health issues will improve  I certify that inpatient services  furnished can reasonably be expected to improve the patient's condition.    Chauncey Mann, MD 3/14/20201:14 PM

## 2018-07-03 NOTE — [facility] Group Notes (Signed)
LCSW Group Therapy Note   07/03/2018 1:15pm   Type of Therapy and Topic:  Group Therapy:  Trust and Honesty  Participation Level:  Did Not Attend  Description of Group:    In this group patients will be asked to explore the value of being honest.  Patients will be guided to discuss their thoughts, feelings, and behaviors related to honesty and trusting in others. Patients will process together how trust and honesty relate to forming relationships with peers, family members, and self. Each patient will be challenged to identify and express feelings of being vulnerable. Patients will discuss reasons why people are dishonest and identify alternative outcomes if one was truthful (to self or others). This group will be process-oriented, with patients participating in exploration of their own experiences, giving and receiving support, and processing challenge from other group members.   Therapeutic Goals: 1. Patient will identify why honesty is important to relationships and how honesty overall affects relationships.  2. Patient will identify a situation where they lied or were lied too and the  feelings, thought process, and behaviors surrounding the situation 3. Patient will identify the meaning of being vulnerable, how that feels, and how that correlates to being honest with self and others. 4. Patient will identify situations where they could have told the truth, but instead lied and explain reasons of dishonesty.   Summary of Patient Progress Pt was invited to attend group but chose not to attend. CSW will continue to encourage pt to attend group throughout their admission.     Therapeutic Modalities:   Cognitive Behavioral Therapy Solution Focused Therapy Motivational Interviewing Brief Therapy  [name]  CUEBAS-COLON, LCSW 07/03/2018 12:56 PM

## 2018-07-04 ENCOUNTER — Inpatient Hospital Stay: Payer: BLUE CROSS/BLUE SHIELD

## 2018-07-04 ENCOUNTER — Encounter: Payer: Self-pay | Admitting: Psychiatry

## 2018-07-04 DIAGNOSIS — R0602 Shortness of breath: Secondary | ICD-10-CM

## 2018-07-04 DIAGNOSIS — J9690 Respiratory failure, unspecified, unspecified whether with hypoxia or hypercapnia: Secondary | ICD-10-CM | POA: Diagnosis present

## 2018-07-04 LAB — CBC
HCT: 28.8 % — ABNORMAL LOW (ref 36.0–46.0)
Hemoglobin: 8.5 g/dL — ABNORMAL LOW (ref 12.0–15.0)
MCH: 24.4 pg — ABNORMAL LOW (ref 26.0–34.0)
MCHC: 29.5 g/dL — ABNORMAL LOW (ref 30.0–36.0)
MCV: 82.5 fL (ref 80.0–100.0)
Platelets: 249 10*3/uL (ref 150–400)
RBC: 3.49 MIL/uL — ABNORMAL LOW (ref 3.87–5.11)
RDW: 16.9 % — ABNORMAL HIGH (ref 11.5–15.5)
WBC: 9.6 10*3/uL (ref 4.0–10.5)
nRBC: 1.8 % — ABNORMAL HIGH (ref 0.0–0.2)

## 2018-07-04 LAB — COMPREHENSIVE METABOLIC PANEL
ALT: 18 U/L (ref 0–44)
AST: 22 U/L (ref 15–41)
Albumin: 3.4 g/dL — ABNORMAL LOW (ref 3.5–5.0)
Alkaline Phosphatase: 53 U/L (ref 38–126)
Anion gap: 8 (ref 5–15)
BUN: 9 mg/dL (ref 6–20)
CO2: 24 mmol/L (ref 22–32)
Calcium: 8.8 mg/dL — ABNORMAL LOW (ref 8.9–10.3)
Chloride: 104 mmol/L (ref 98–111)
Creatinine, Ser: 0.78 mg/dL (ref 0.44–1.00)
GFR calc Af Amer: >60 mL/min (ref >60)
GFR calc non Af Amer: >60 mL/min (ref >60)
Glucose, Bld: 100 mg/dL — ABNORMAL HIGH (ref 70–99)
Potassium: 3.4 mmol/L — ABNORMAL LOW (ref 3.5–5.1)
Sodium: 136 mmol/L (ref 135–145)
Total Bilirubin: 0.3 mg/dL (ref 0.3–1.2)
Total Protein: 6.5 g/dL (ref 6.5–8.1)

## 2018-07-04 MED ORDER — LORAZEPAM 1 MG PO TABS: 1.0000 mg | ORAL_TABLET | Freq: Four times a day (QID) | ORAL | Status: AC | PRN

## 2018-07-04 MED ORDER — FOLIC ACID 1 MG PO TABS
1.0000 mg | ORAL_TABLET | Freq: Every day | ORAL | Status: DC
  Administered 2018-07-05 – 2018-07-09 (×5): 1 mg via ORAL
  Filled 2018-07-04 (×5): qty 1

## 2018-07-04 MED ORDER — LISINOPRIL 20 MG PO TABS
20.0000 mg | ORAL_TABLET | Freq: Every day | ORAL | Status: DC
  Administered 2018-07-05 – 2018-07-06 (×2): 20 mg via ORAL
  Filled 2018-07-04 (×2): qty 1

## 2018-07-04 MED ORDER — BUDESONIDE 0.5 MG/2ML IN SUSP
0.5000 mg | Freq: Two times a day (BID) | RESPIRATORY_TRACT | Status: DC
  Administered 2018-07-05 – 2018-07-08 (×7): 0.5 mg via RESPIRATORY_TRACT
  Filled 2018-07-04 (×7): qty 2

## 2018-07-04 MED ORDER — METHYLPREDNISOLONE SODIUM SUCC 125 MG IJ SOLR
125.0000 mg | Freq: Once | INTRAMUSCULAR | Status: DC
  Filled 2018-07-04: qty 2

## 2018-07-04 MED ORDER — ADULT MULTIVITAMIN W/MINERALS CH: 1.0000 | ORAL_TABLET | Freq: Every day | ORAL | Status: DC

## 2018-07-04 MED ORDER — VITAMIN B-1 100 MG PO TABS
100.0000 mg | ORAL_TABLET | Freq: Every day | ORAL | Status: DC
  Administered 2018-07-05 – 2018-07-09 (×5): 100 mg via ORAL
  Filled 2018-07-04 (×5): qty 1

## 2018-07-04 MED ORDER — SERTRALINE HCL 50 MG PO TABS
100.0000 mg | ORAL_TABLET | Freq: Every day | ORAL | Status: DC
  Administered 2018-07-05 – 2018-07-09 (×5): 100 mg via ORAL
  Filled 2018-07-04 (×5): qty 2

## 2018-07-04 MED ORDER — AZITHROMYCIN 250 MG PO TABS: 250.0000 mg | ORAL_TABLET | Freq: Every day | ORAL | Status: DC

## 2018-07-04 MED ORDER — ENOXAPARIN SODIUM 40 MG/0.4ML ~~LOC~~ SOLN: 40.0000 mg | SUBCUTANEOUS | Status: DC

## 2018-07-04 MED ORDER — ACETAMINOPHEN 325 MG PO TABS: 650.0000 mg | ORAL_TABLET | Freq: Four times a day (QID) | ORAL | Status: DC | PRN

## 2018-07-04 MED ORDER — FERROUS SULFATE 325 (65 FE) MG PO TABS
325.0000 mg | ORAL_TABLET | Freq: Two times a day (BID) | ORAL | Status: DC
  Administered 2018-07-05 – 2018-07-09 (×8): 325 mg via ORAL
  Filled 2018-07-04 (×8): qty 1

## 2018-07-04 MED ORDER — METHYLPREDNISOLONE SODIUM SUCC 125 MG IJ SOLR: 125.0000 mg | Freq: Once | INTRAMUSCULAR | Status: DC

## 2018-07-04 MED ORDER — IPRATROPIUM-ALBUTEROL 0.5-2.5 (3) MG/3ML IN SOLN
3.0000 mL | Freq: Four times a day (QID) | RESPIRATORY_TRACT | Status: DC
  Administered 2018-07-05: 3 mL via RESPIRATORY_TRACT
  Filled 2018-07-04: qty 3

## 2018-07-04 MED ORDER — FERROUS SULFATE 325 (65 FE) MG PO TABS
325.0000 mg | ORAL_TABLET | Freq: Two times a day (BID) | ORAL | Status: DC
  Administered 2018-07-04: 325 mg via ORAL
  Filled 2018-07-04: qty 1

## 2018-07-04 MED ORDER — ADULT MULTIVITAMIN W/MINERALS CH
1.0000 | ORAL_TABLET | Freq: Every day | ORAL | Status: DC
  Administered 2018-07-05 – 2018-07-09 (×5): 1 via ORAL
  Filled 2018-07-04 (×5): qty 1

## 2018-07-04 MED ORDER — VITAMIN B-12 1000 MCG PO TABS
1000.0000 ug | ORAL_TABLET | Freq: Every day | ORAL | Status: DC
  Administered 2018-07-06: 1000 ug via ORAL
  Filled 2018-07-04: qty 1

## 2018-07-04 MED ORDER — LORAZEPAM 2 MG/ML IJ SOLN: 1.0000 mg | Freq: Four times a day (QID) | INTRAMUSCULAR | Status: AC | PRN

## 2018-07-04 MED ORDER — HYDROCODONE-ACETAMINOPHEN 5-325 MG PO TABS
1.0000 | ORAL_TABLET | ORAL | Status: DC | PRN
  Administered 2018-07-05 – 2018-07-08 (×6): 1 via ORAL
  Filled 2018-07-04 (×6): qty 1

## 2018-07-04 MED ORDER — ALBUTEROL SULFATE (2.5 MG/3ML) 0.083% IN NEBU
2.5000 mg | INHALATION_SOLUTION | RESPIRATORY_TRACT | Status: DC | PRN
  Filled 2018-07-04: qty 3

## 2018-07-04 MED ORDER — THIAMINE HCL 100 MG/ML IJ SOLN
100.0000 mg | Freq: Every day | INTRAMUSCULAR | Status: DC
  Filled 2018-07-04: qty 2

## 2018-07-04 MED ORDER — PANTOPRAZOLE SODIUM 40 MG PO TBEC
40.0000 mg | DELAYED_RELEASE_TABLET | Freq: Two times a day (BID) | ORAL | Status: DC
  Administered 2018-07-05 – 2018-07-09 (×9): 40 mg via ORAL
  Filled 2018-07-04 (×9): qty 1

## 2018-07-04 MED ORDER — SIMVASTATIN 20 MG PO TABS
40.0000 mg | ORAL_TABLET | Freq: Every day | ORAL | Status: DC
  Administered 2018-07-05 – 2018-07-09 (×5): 40 mg via ORAL
  Filled 2018-07-04 (×5): qty 2

## 2018-07-04 MED ORDER — HYDROXYZINE HCL 25 MG PO TABS
25.0000 mg | ORAL_TABLET | Freq: Four times a day (QID) | ORAL | Status: AC | PRN
  Administered 2018-07-05: 25 mg via ORAL
  Filled 2018-07-04 (×2): qty 1

## 2018-07-04 MED ORDER — LURASIDONE HCL 40 MG PO TABS: 40.0000 mg | ORAL_TABLET | Freq: Every day | ORAL | Status: DC

## 2018-07-04 MED ORDER — ALBUTEROL SULFATE HFA 108 (90 BASE) MCG/ACT IN AERS
2.0000 | INHALATION_SPRAY | Freq: Four times a day (QID) | RESPIRATORY_TRACT | Status: DC
  Filled 2018-07-04: qty 6.7

## 2018-07-04 MED ORDER — PROMETHAZINE HCL 25 MG PO TABS
12.5000 mg | ORAL_TABLET | Freq: Four times a day (QID) | ORAL | Status: DC | PRN
  Filled 2018-07-04: qty 1

## 2018-07-04 MED ORDER — CYANOCOBALAMIN 1000 MCG/ML IJ SOLN
1000.0000 ug | Freq: Every day | INTRAMUSCULAR | Status: AC
  Administered 2018-07-05: 1000 ug via INTRAMUSCULAR
  Filled 2018-07-04: qty 1

## 2018-07-04 MED ORDER — HYDRALAZINE HCL 25 MG PO TABS: 25.0000 mg | ORAL_TABLET | Freq: Three times a day (TID) | ORAL | Status: DC

## 2018-07-04 MED ORDER — LURASIDONE HCL 40 MG PO TABS
40.0000 mg | ORAL_TABLET | Freq: Every day | ORAL | Status: DC
  Administered 2018-07-05 – 2018-07-09 (×5): 40 mg via ORAL
  Filled 2018-07-04 (×6): qty 1

## 2018-07-04 MED ORDER — LISINOPRIL 20 MG PO TABS: 20.0000 mg | ORAL_TABLET | Freq: Every day | ORAL | Status: DC

## 2018-07-04 MED ORDER — TRAZODONE HCL 100 MG PO TABS: 100.0000 mg | ORAL_TABLET | Freq: Every evening | ORAL | Status: DC | PRN

## 2018-07-04 MED ORDER — CLONIDINE HCL 0.1 MG PO TABS
0.1000 mg | ORAL_TABLET | Freq: Two times a day (BID) | ORAL | Status: DC | PRN
  Administered 2018-07-06: 0.1 mg via ORAL
  Filled 2018-07-04: qty 1

## 2018-07-04 MED ORDER — SODIUM CHLORIDE 0.9 % IV SOLN
500.0000 mg | INTRAVENOUS | Status: DC
  Administered 2018-07-05 (×2): 500 mg via INTRAVENOUS
  Filled 2018-07-04 (×3): qty 500

## 2018-07-04 MED ORDER — FLUTICASONE PROPIONATE 50 MCG/ACT NA SUSP
2.0000 | Freq: Every day | NASAL | Status: DC
  Administered 2018-07-05 – 2018-07-09 (×5): 2 via NASAL
  Filled 2018-07-04 (×2): qty 16

## 2018-07-04 MED ORDER — IPRATROPIUM-ALBUTEROL 0.5-2.5 (3) MG/3ML IN SOLN: 3.0000 mL | Freq: Four times a day (QID) | RESPIRATORY_TRACT | Status: DC

## 2018-07-04 MED ORDER — METOPROLOL SUCCINATE ER 50 MG PO TB24
50.0000 mg | ORAL_TABLET | Freq: Every day | ORAL | Status: DC
  Administered 2018-07-05 – 2018-07-09 (×5): 50 mg via ORAL
  Filled 2018-07-04 (×7): qty 1

## 2018-07-04 MED ORDER — ALBUTEROL SULFATE (2.5 MG/3ML) 0.083% IN NEBU: 3.0000 mL | INHALATION_SOLUTION | Freq: Four times a day (QID) | RESPIRATORY_TRACT | Status: DC

## 2018-07-04 MED ORDER — VITAMIN B-1 100 MG PO TABS: 100.0000 mg | ORAL_TABLET | Freq: Every day | ORAL | Status: DC

## 2018-07-04 MED ORDER — METOPROLOL SUCCINATE ER 50 MG PO TB24: 50.0000 mg | ORAL_TABLET | Freq: Every day | ORAL | Status: DC

## 2018-07-04 MED ORDER — LOPERAMIDE HCL 2 MG PO CAPS
2.0000 mg | ORAL_CAPSULE | ORAL | Status: AC | PRN
  Filled 2018-07-04: qty 2

## 2018-07-04 MED ORDER — ALUM & MAG HYDROXIDE-SIMETH 200-200-20 MG/5ML PO SUSP: 30.0000 mL | ORAL | Status: DC | PRN

## 2018-07-04 MED ORDER — MAGNESIUM HYDROXIDE 400 MG/5ML PO SUSP: 30.0000 mL | Freq: Every day | ORAL | Status: DC | PRN

## 2018-07-04 NOTE — Progress Notes (Signed)
Contacted Hospitalist on call Dr Jerelyn Charles to see patient STAT due to SOB and wheezing as well as low Pulse ox of 85%. He agreed.

## 2018-07-04 NOTE — Progress Notes (Signed)
Family Meeting Note  Advance Directive:yes  Today a meeting took place with the Patient.  Patient is able to participate   The following clinical team members were present during this meeting:MD  The following were discussed:Patient's diagnosis: Acute aspiration pneumonitis/pneumonia, Patient's progosis: Unable to determine and Goals for treatment: Full Code  Additional follow-up to be provided: prn  Time spent during discussion:20 minutes  Gorden Harms, MD

## 2018-07-04 NOTE — Progress Notes (Signed)
RCP was unable to obtain arterial sample for ABG. Did obtain VBG with results in chart. Pt's O2 sat 99 on rm air.

## 2018-07-04 NOTE — [facility] Suicide Risk Assessment (Signed)
[facility] INPATIENT:  Family/Significant Other Suicide Prevention Education  Suicide Prevention Education:  Patient Refusal for Family/Significant Other Suicide Prevention Education: The patient Lisa Vasquez has refused to provide written consent for family/significant other to be provided Family/Significant Other Suicide Prevention Education during admission and/or prior to discharge.  Physician notified.  Autymn Omlor  CUEBAS-COLON 07/04/2018, 4:11 PM
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
## 2018-07-04 NOTE — BHH Group Notes (Signed)
LCSW Group Therapy Note 07/04/2018 1:15pm  Type of Therapy and Topic: Group Therapy: Feelings Around Returning Home & Establishing a Supportive Framework and Supporting Oneself When Supports Not Available  Participation Level: Active  Description of Group:  Patients first processed thoughts and feelings about upcoming discharge. These included fears of upcoming changes, lack of change, new living environments, judgements and expectations from others and overall stigma of mental health issues. The group then discussed the definition of a supportive framework, what that looks and feels like, and how do to discern it from an unhealthy non-supportive network. The group identified different types of supports as well as what to do when your family/friends are less than helpful or unavailable  Therapeutic Goals  1. Patient will identify one healthy supportive network that they can use at discharge. 2. Patient will identify one factor of a supportive framework and how to tell it from an unhealthy network. 3. Patient able to identify one coping skill to use when they do not have positive supports from others. 4. Patient will demonstrate ability to communicate their needs through discussion and/or role plays.  Summary of Patient Progress:  The patient reported she feels "good today." Pt engaged during group session. As patients processed their anxiety about discharge and described healthy supports patient shared she is ready to be discharge. She reported "I realized that I made a big mistake. I am not drinking anymore."  Patients identified at least one self-care tool they were willing to use after discharge.   Therapeutic Modalities Cognitive Behavioral Therapy Motivational Interviewing   Lisa Vasquez  CUEBAS-COLON, LCSW 07/04/2018 9:35 AM

## 2018-07-04 NOTE — H&P (Signed)
[facility] NAME: Lisa Vasquez    MR#:  [id number]  DATE OF BIRTH:  28-Jul-1970  DATE OF ADMISSION:  (Not on file)  PRIMARY CARE PHYSICIAN: Ronnell Freshwater, PA-C   REQUESTING/REFERRING PHYSICIAN:   CHIEF COMPLAINT:  No chief complaint on file.   HISTORY OF PRESENT ILLNESS: Lisa Vasquez  is a 48 y.o. female with a known history per below, recent hospital discharge to inpatient psychiatry for acute drug overdose with associated aspiration pneumonia, hospitalist called for acute hypoxia with O2 saturation in the 80s in the inpatient psychiatric facility, patient currently in no apparent distress, resting comfortably in bed, discussed with nursing staff-patient to be placed on oxygen and transferred to hospital for continued medical management for acute hypoxic respiratory failure most likely secondary to aspiration pneumonia.  PAST MEDICAL HISTORY:   Past Medical History:  Diagnosis Date  . Anemia   . Hypertension   . Skin cancer     PAST SURGICAL HISTORY:  Past Surgical History:  Procedure Laterality Date  . CERVICAL ABLATION    . EYE SURGERY    . NASAL SINUS SURGERY  2001  . STRABISMUS SURGERY Right   . WISDOM TOOTH EXTRACTION      SOCIAL HISTORY:  Social History   Tobacco Use  . Smoking status: Former Research scientist (life sciences)  . Smokeless tobacco: Never Used  Substance Use Topics  . Alcohol use: Yes    Comment: "too much"    FAMILY HISTORY:  Family History  Problem Relation Age of Onset  . COPD Mother   . Skin cancer Mother   . Heart defect Mother   . Heart attack Maternal Aunt   . Heart attack Maternal Uncle   . Breast cancer Maternal Grandmother   . Melanoma Maternal Uncle     DRUG ALLERGIES:  Allergies  Allergen Reactions  . Ondansetron Other (See Comments)    Constipation     REVIEW OF SYSTEMS:   CONSTITUTIONAL: No fever, fatigue or weakness.  EYES: No blurred or double vision.  EARS, NOSE, AND THROAT: No  tinnitus or ear pain.  RESPIRATORY: + cough, shortness of breath, wheezing   CARDIOVASCULAR: No chest pain, orthopnea, edema.  GASTROINTESTINAL: No nausea, vomiting, diarrhea or abdominal pain.  GENITOURINARY: No dysuria, hematuria.  ENDOCRINE: No polyuria, nocturia,  HEMATOLOGY: No anemia, easy bruising or bleeding SKIN: No rash or lesion. MUSCULOSKELETAL: No joint pain or arthritis.   NEUROLOGIC: No tingling, numbness, weakness.  PSYCHIATRY: No anxiety or depression.   MEDICATIONS AT HOME:  Prior to Admission medications   Medication Sig Start Date End Date Taking? Authorizing Provider  albuterol (PROVENTIL HFA;VENTOLIN HFA) 108 (90 Base) MCG/ACT inhaler Inhale 2 puffs into the lungs every 6 (six) hours as needed for wheezing or shortness of breath. 07/02/18   Gladstone Lighter, MD  azithromycin (ZITHROMAX) 250 MG tablet For 4 days 07/03/18 07/06/18  Gladstone Lighter, MD  cyclobenzaprine (FLEXERIL) 10 MG tablet Take 10 mg by mouth daily. 12/18/17   [provider]  fluticasone (FLONASE) 50 MCG/ACT nasal spray PLACE 1 SPRAY IN EACH NOSTRIL D 09/01/16   [provider]  lisinopril (PRINIVIL,ZESTRIL) 20 MG tablet Take 1 tablet (20 mg total) by mouth daily. 07/02/18   Gladstone Lighter, MD  lurasidone (LATUDA) 40 MG TABS tablet Take 1 tablet (40 mg total) by mouth daily with breakfast. 07/02/18   Gladstone Lighter, MD  metoprolol succinate (TOPROL-XL) 50 MG 24 hr tablet Take 1 tablet (50 mg total)  by mouth daily. Take with or immediately following a meal. 07/02/18   Gladstone Lighter, MD  pantoprazole (PROTONIX) 40 MG tablet Take 1 tablet (40 mg total) by mouth 2 (two) times daily before a meal. 07/02/18   Gladstone Lighter, MD  rizatriptan (MAXALT) 10 MG tablet Take 10 mg by mouth as needed for migraine. May repeat in 2 hours if needed    [provider]  simvastatin (ZOCOR) 40 MG tablet Take 40 mg by mouth daily.    [provider]      PHYSICAL  EXAMINATION:   VITAL SIGNS: There were no vitals taken for this visit.  GENERAL:  48 y.o.-year-old patient lying in the bed with no acute distress.  EYES: Pupils equal, round, reactive to light and accommodation. No scleral icterus. Extraocular muscles intact.  HEENT: Head atraumatic, normocephalic. Oropharynx and nasopharynx clear.  NECK:  Supple, no jugular venous distention. No thyroid enlargement, no tenderness.  LUNGS: Normal breath sounds bilaterally, no wheezing, rales,rhonchi or crepitation. No use of accessory muscles of respiration.  CARDIOVASCULAR: S1, S2 normal. No murmurs, rubs, or gallops.  ABDOMEN: Soft, nontender, nondistended. Bowel sounds present. No organomegaly or mass.  EXTREMITIES: No pedal edema, cyanosis, or clubbing.  NEUROLOGIC: Cranial nerves II through XII are intact. Muscle strength 5/5 in all extremities. Sensation intact. Gait not checked.  PSYCHIATRIC: The patient is alert and oriented x 3.  SKIN: No obvious rash, lesion, or ulcer.   LABORATORY PANEL:   CBC Recent Labs  Lab 06/29/18 1858 06/30/18 0526 07/01/18 0543 07/01/18 1046 07/02/18 0531 07/03/18 1130  WBC 12.4* 11.9* 10.3 11.1* 8.4 8.3  HGB 10.0* 7.6* 7.1* 7.2* 7.8* 8.3*  HCT 34.4* 26.4* 24.7* 24.5* 26.0* 27.4*  PLT 471* 260 202 221 203 221  MCV 80.2 82.8 81.3 80.1 80.2 81.1  MCH 23.3* 23.8* 23.4* 23.5* 24.1* 24.6*  MCHC 29.1* 28.8* 28.7* 29.4* 30.0 30.3  RDW 15.9* 15.7* 15.9* 15.8* 15.3 15.9*  LYMPHSABS 4.6*  --   --   --   --  0.8  MONOABS 0.7  --   --   --   --  0.5  EOSABS 1.7*  --   --   --   --  0.5  BASOSABS 0.2*  --   --   --   --  0.1   ------------------------------------------------------------------------------------------------------------------  Chemistries  Recent Labs  Lab 06/29/18 1858 06/30/18 0526 07/01/18 0539 07/01/18 0543 07/01/18 1457 07/02/18 0531  NA 140 143  --  139  --  138  K 3.9 3.5  --  2.9* 3.2* 3.1*  CL 107 117*  --  108  --  107  CO2 20* 18*   --  23  --  22  GLUCOSE 112* 106*  --  104*  --  101*  BUN 10 11  --  7  --  6  CREATININE 0.74 0.89  --  0.73  --  0.62  CALCIUM 8.3* 6.8*  --  7.2*  --  7.7*  MG  --   --  1.7  --   --   --   AST 99* 83*  --   --   --   --   ALT 40 37  --   --   --   --   ALKPHOS 64 46  --   --   --   --   BILITOT 0.3 0.5  --   --   --   --    ------------------------------------------------------------------------------------------------------------------  estimated creatinine clearance is 106.2 mL/min (by C-G formula based on SCr of 0.62 mg/dL). ------------------------------------------------------------------------------------------------------------------ Recent Labs    07/03/18 0638  TSH 5.041*     Coagulation profile No results for input(s): INR, PROTIME in the last 168 hours. ------------------------------------------------------------------------------------------------------------------- No results for input(s): DDIMER in the last 72 hours. -------------------------------------------------------------------------------------------------------------------  Cardiac Enzymes No results for input(s): CKMB, TROPONINI, MYOGLOBIN in the last 168 hours.  Invalid input(s): CK ------------------------------------------------------------------------------------------------------------------ Invalid input(s): POCBNP  ---------------------------------------------------------------------------------------------------------------  Urinalysis    Component Value Date/Time   COLORURINE STRAW (A) 06/29/2018 1858   APPEARANCEUR HAZY (A) 06/29/2018 1858   LABSPEC 1.005 06/29/2018 1858   PHURINE 5.0 06/29/2018 1858   GLUCOSEU NEGATIVE 06/29/2018 1858   HGBUR NEGATIVE 06/29/2018 1858   BILIRUBINUR NEGATIVE 06/29/2018 1858   KETONESUR NEGATIVE 06/29/2018 1858   PROTEINUR NEGATIVE 06/29/2018 1858   NITRITE NEGATIVE 06/29/2018 1858   LEUKOCYTESUR NEGATIVE 06/29/2018 1858     RADIOLOGY: Dg Chest  Port 1 View  Result Date: 07/04/2018 CLINICAL DATA:  Shortness of Breath EXAM: PORTABLE CHEST 1 VIEW COMPARISON:  07/01/2018 FINDINGS: Lobation of the right hemidiaphragm. Heart is normal size. Mild peribronchial thickening. No confluent opacities or effusions. No acute bony abnormality. IMPRESSION: Mild bronchitic changes. Electronically Signed   By: Rolm Baptise M.D.   On: 07/04/2018 19:13    EKG: Orders placed or performed during the hospital encounter of 07/02/18  . EKG 12-Lead  . EKG 12-Lead  . EKG 12-Lead  . EKG 12-Lead    IMPRESSION AND PLAN: *Acute on chronic hypoxic respiratory failure Most likely secondary to recent aspiration pneumonia/pneumonitis resulting from acute drug overdose Admit to regular nursing for bed, IV Solu-Medrol x1, check stat two-view chest x-ray, blood gas, schedule breathing treatments, mucolytic agents, respiratory therapy to see, admission blood work, CBC with differential, CMP, cycle cardiac enzymes, supplemental oxygen with weaning as tolerated  *Acute drug overdose Continue one-to-one sitter, suicide precautions, psychiatry/Dr. Nicolasa Ducking consulted for continuity of care, and continue close medical monitoring  *Chronic alcohol abuse syndrome Alcohol withdrawal protocol while in house  *Bipolar illness Continue current psychotropic regiment, psychiatry to see  *Chronic iron deficiency anemia Stable Continue ferrous sulfate  *Chronic obesity Most likely secondary to excess calories Lifestyle modification recommended  *Chronic benign essential hypertension Stable Continue lisinopril, metoprolol   All the records are reviewed and case discussed with ED provider. Management plans discussed with the patient, family and they are in agreement.  CODE STATUS:full Code Status History    Date Active Date Inactive Code Status Order ID Comments User Context   06/30/2018 0027 07/02/2018 1658 Full Code [id number]  Lance Coon, MD Inpatient       TOTAL  TIME TAKING CARE OF THIS PATIENT: 40 minutes.    Avel Peace Donathan Buller M.D on 07/04/2018   Between 7am to 6pm - Pager - [phone]  After 6pm go to [email or web address] - password [email or web address] [facility]  Office  [phone]  CC: Primary care physician; Ronnell Freshwater, PA-C   Note: This dictation was prepared with Dragon dictation along with smaller phrase technology. Any transcriptional errors that result from this process are unintentional.

## 2018-07-04 NOTE — Plan of Care (Signed)
Pt is been transfer to acute care unit

## 2018-07-04 NOTE — Discharge Instructions (Signed)
Patient to be transferred to medicine service.

## 2018-07-04 NOTE — [facility] Suicide Risk Assessment (Signed)
[facility] Discharge Suicide Risk Assessment   Principal Problem: Major Depression  Discharge Diagnoses: Active Problems:   Severe recurrent major depression without psychotic features (HCC)   Alcohol use disorder, moderate, dependence (HCC)   SOB (shortness of breath)   Total Time spent with patient: 45 minutes  Musculoskeletal: See Discharge Summary  Psychiatric Specialty Exam: ROS: see Discharge Summary  Blood pressure (!) 154/95, pulse 88, temperature 97.8 F (36.6 C), temperature source Oral, resp. rate 20, height 5\' 6"  (1.676 m), weight 108.9 kg, SpO2 97 %.Body mass index is 38.74 kg/m.  See Discharge Summary for MSE                                                     Mental Status Per Nursing Assessment::   On Admission:  Suicidal ideation indicated by patient  Demographic Factors:  Caucasian and Unemployed  Loss Factors: Decrease in vocational status, Decline in physical health and Financial problems/change in socioeconomic status  Historical Factors: Recurrent depression and noncompliance with treatment  Risk Reduction Factors:   Responsible for children under 49 years of age, Sense of responsibility to family, Living with another person, especially a relative and Positive social support  Continued Clinical Symptoms:  Bipolar Disorder:   Depressive phase  Cognitive Features That Contribute To Risk:  None    Suicide Risk:  Minimal: No identifiable suicidal ideation.  Patients presenting with no risk factors but with morbid ruminations; may be classified as minimal risk based on the severity of the depressive symptoms    Plan Of Care/Follow-up recommendations:  Per medicine service as patient is being transferred to medicine service  Chauncey Mann, MD 07/04/2018, 6:48 PM

## 2018-07-04 NOTE — Plan of Care (Signed)
D- Patient alert and oriented. Patient presents in a pleasant mood on assessment stating that she didn't sleep well last night because of her breathing issues. 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 Patient denies SI, HI, AVH, at this time. Patient also denies any depression and anxiety stating that "I feel good today". Patient's goal for today is "getting my breathing and blood pressure under control".  A- Scheduled medications administered to patient, per MD orders. Support and encouragement provided.  Routine safety checks conducted every 15 minutes.  Patient informed to notify staff with problems or concerns.  R- No adverse drug reactions noted. Patient contracts for safety at this time. Patient compliant with medications and treatment plan. Patient receptive, calm, and cooperative. Patient interacts well with others on the unit.  Patient remains safe at this time.   Problem: Education: Goal: Knowledge of [facility] Education information/materials will improve Outcome: Progressing Goal: Emotional status will improve Outcome: Progressing Goal: Mental status will improve Outcome: Progressing Goal: Verbalization of understanding the information provided will improve Outcome: Progressing   Problem: Education: Goal: Utilization of techniques to improve thought processes will improve Outcome: Progressing Goal: Knowledge of the prescribed therapeutic regimen will improve Outcome: Progressing   Problem: Activity: Goal: Interest or engagement in leisure activities will improve Outcome: Progressing Goal: Imbalance in normal sleep/wake cycle will improve Outcome: Progressing   Problem: Safety: Goal: Ability to disclose and discuss suicidal ideas will improve Outcome: Progressing Goal: Ability to identify and utilize support systems that promote safety will  improve Outcome: Progressing

## 2018-07-04 NOTE — Progress Notes (Addendum)
Psychiatric Progress Notes  Patient Identification: Lisa Vasquez MRN:  [id number] Date of Evaluation:  07/04/2018 Chief Complaint:  Depression Principal Diagnosis: Depression  Diagnosis:  Active Problems:   Severe recurrent major depression without psychotic features ([place])   Alcohol use disorder, moderate, dependence ([place])  History of Present Illness:  The patient is doing much better today in terms of her mood.  She is no longer is tearful or emotional.  She does regret the overdose and got a chance to speak to both her husband and daughter yesterday during visiting hours about her depression.  She is feeling more positive about the future and has been journaling about her thoughts.  She is agreeable to seeing a psychiatrist and therapist after discharge.  She denies any current active or passive suicidal thoughts or psychosis.  She slept fairly well last night.  Appetite is good.  Blood pressure was elevated since admission and has improved but is still elevated.  Medicine is following and adjusting blood pressure medications.  Appetite is good.  She is having a problem with a dry cough and shortness of breath and requires an inhaler.  Past psychiatric history: The patient denies any prior inpatient psychiatric hospitalizations or suicide attempts.  She was seeing a psychiatrist at [facility] in [place] over 10 years ago.  She cannot remember any prior mood stabilizers but has been on Zoloft from her PCP for several years.  Social History: The patient was born at and raised in [place], [place] by her mother and stepfather as she did not know her biological father.  She did have a close relationship with her parents and denies any history of any physical or sexual abuse.  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  She has been married for over 81 years and has one 36 year old daughter.  She and her husband and daughter live in [place].  Substance abuse history: The patient was born at and raised in [place], [place] by her mother and stepfather as she did not know her biological father.  She did have a close relationship with her parents and denies any history of any physical or sexual abuse.  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  She has been married for over 82 years and has one 20 year old daughter.  She and her husband and daughter live in [place].  Substance abuse history: The patient has been drinking 750 cc of wine per day for 20 years.  She denies any history of any cannabis, cocaine, opioid or stimulant use.  She quit smoking cigarettes in 2004  Family psychiatric history: The patient's daughter has bipolar disorder type II and sees a psychiatrist  Legal history: She has multiple pending charges for theft and fraud    Associated Signs/Symptoms: Depression Symptoms:  depressed mood, anhedonia, insomnia, fatigue, feelings of worthlessness/guilt, suicidal attempt, anxiety, (Hypo) Manic Symptoms: None currently Anxiety Symptoms:  Excessive Worry, Worry over legal charges Psychotic Symptoms:  None PTSD Symptoms: None Total Time spent with patient: 45 minutes   Is the patient at risk to self? Yes.    Has  the patient been a risk to self in the past 6 months? Yes.    Has the patient been a risk to self within the distant past? Yes.    Is the patient a risk to others? No.  Has the patient been a risk to others in the past 6 months? No.  Has the patient been a risk to others within the distant past? No.   Prior Inpatient Therapy:  No Prior  Outpatient Therapy:  Yes  Alcohol Screening: 1. How often do you have a drink containing alcohol?: 4 or more times a week 2. How many drinks containing alcohol do you have on a typical day when you are drinking?: 5 or 6 3. How often do you have six or more drinks on one occasion?: Daily or almost daily AUDIT-C Score: 10 4. How often during the last year have you found that you were not able to stop drinking once you had started?: Weekly 5. How often during the last year have you failed to do what was normally expected from you becasue of drinking?: Weekly 6. How often during the last year have you needed a first drink in the morning to get yourself going after a heavy drinking session?: Never 7. How often during the last year have you had a feeling of guilt of remorse after drinking?: Weekly 8. How often during the last year have you been unable to remember what happened the night before because you had been drinking?: Weekly 9. Have you or someone else been injured as a result of your drinking?: No 10. Has a relative or friend or a doctor or another health worker been concerned about your drinking or suggested you cut down?: Yes, during the last year Alcohol Use Disorder Identification Test Final Score (AUDIT): 26 Alcohol Brief Interventions/Follow-up: Continued Monitoring Substance Abuse History in the last 12 months:  Yes.   Consequences of Substance Abuse: Legal Consequences:  Pending charges Previous Psychotropic Medications: Yes  Psychological Evaluations: Yes  Past Medical History:  Past Medical History:  Diagnosis Date  . Anemia   . Hypertension   . Skin cancer     Past Surgical History:  Procedure Laterality Date  . CERVICAL ABLATION    . EYE SURGERY    . NASAL SINUS SURGERY  2001  . STRABISMUS SURGERY Right   . WISDOM TOOTH EXTRACTION     Family History:  Family History  Problem Relation Age of Onset  . COPD Mother   . Skin cancer Mother   . Heart defect Mother   .  Heart attack Maternal Aunt   . Heart attack Maternal Uncle   . Breast cancer Maternal Grandmother   . Melanoma Maternal Uncle     Tobacco Screening: Have you used any form of tobacco in the last 30 days? (Cigarettes, Smokeless Tobacco, Cigars, and/or Pipes): No Social History:  Social History   Substance and Sexual Activity  Alcohol Use Yes   Comment: "too much"     Social History   Substance and Sexual Activity  Drug Use Not Currently  . Types: Cocaine    Additional Social History:                           Allergies:   Allergies  Allergen Reactions  . Ondansetron Other (See Comments)    Constipation    Lab Results:  Results for orders placed or performed during the hospital encounter of 07/02/18 (from the past  48 hour(s))  Hemoglobin A1c     Status: None   Collection Time: 07/03/18  6:38 AM  Result Value Ref Range   Hgb A1c MFr Bld 5.5 4.8 - 5.6 %    Comment: (NOTE) Pre diabetes:          5.7%-6.4% Diabetes:              >6.4% Glycemic control for   <7.0% adults with diabetes    Mean Plasma Glucose 111.15 mg/dL    Comment: Performed at [facility] [address] [place], [place] [address]  Lipid panel     Status: Abnormal   Collection Time: 07/03/18  6:38 AM  Result Value Ref Range   Cholesterol 215 (H) 0 - 200 mg/dL   Triglycerides 159 (H) <150 mg/dL   HDL 63 >40 mg/dL   Total CHOL/HDL Ratio 3.4 RATIO   VLDL 32 0 - 40 mg/dL   LDL Cholesterol 120 (H) 0 - 99 mg/dL    Comment:        Total Cholesterol/HDL:CHD Risk Coronary Heart Disease Risk Table                     Men   Women  1/2 Average Risk   3.4   3.3  Average Risk       5.0   4.4  2 X Average Risk   9.6   7.1  3 X Average Risk  23.4   11.0        Use the calculated Patient Ratio above and the CHD Risk Table to determine the patient's CHD Risk.        ATP III CLASSIFICATION (LDL):  <100     mg/dL   Optimal  100-129  mg/dL   Near or Above                    Optimal   130-159  mg/dL   Borderline  160-189  mg/dL   High  >190     mg/dL   Very High Performed at [facility], [address] [place], [place] [address]   TSH     Status: Abnormal   Collection Time: 07/03/18  6:38 AM  Result Value Ref Range   TSH 5.041 (H) 0.350 - 4.500 uIU/mL    Comment: Performed by a 3rd Generation assay with a functional sensitivity of <=0.01 uIU/mL. Performed at [facility], [address] [place], [place] [address]   CBC with Differential/Platelet     Status: Abnormal   Collection Time: 07/03/18 11:30 AM  Result Value Ref Range   WBC 8.3 4.0 - 10.5 K/uL   RBC 3.38 (L) 3.87 - 5.11 MIL/uL   Hemoglobin 8.3 (L) 12.0 - 15.0 g/dL   HCT 27.4 (L) 36.0 - 46.0 %   MCV 81.1 80.0 - 100.0 fL   MCH 24.6 (L) 26.0 - 34.0 pg   MCHC 30.3 30.0 - 36.0 g/dL   RDW 15.9 (H) 11.5 - 15.5 %   Platelets 221 150 - 400 K/uL   nRBC 0.7 (H) 0.0 - 0.2 %   Neutrophils Relative % 75 %   Neutro Abs 6.4 1.7 - 7.7 K/uL   Lymphocytes Relative 10 %   Lymphs Abs 0.8 0.7 - 4.0 K/uL   Monocytes Relative 6 %   Monocytes Absolute 0.5 0.1 - 1.0 K/uL   Eosinophils Relative 6 %   Eosinophils Absolute 0.5 0.0 - 0.5 K/uL  Basophils Relative 1 %   Basophils Absolute 0.1 0.0 - 0.1 K/uL   Immature Granulocytes 2 %   Abs Immature Granulocytes 0.15 (H) 0.00 - 0.07 K/uL    Comment: Performed at [facility], [address] [place], [place] [address]    Blood Alcohol level:  Lab Results  Component Value Date   WNU [facility]) 53/66/4403    Metabolic Disorder Labs:  Lab Results  Component Value Date   HGBA1C 5.5 07/03/2018   MPG 111.15 07/03/2018   No results found for: PROLACTIN Lab Results  Component Value Date   CHOL 215 (H) 07/03/2018   TRIG 159 (H) 07/03/2018   HDL 63 07/03/2018   CHOLHDL 3.4 07/03/2018   VLDL 32 07/03/2018   LDLCALC 120 (H) 07/03/2018    Current Medications: Current Facility-Administered Medications  Medication Dose Route Frequency  Provider Last Rate Last Dose  . acetaminophen (TYLENOL) tablet 650 mg  650 mg Oral Q6H PRN Clapacs, John T, MD   650 mg at 07/03/18 0120  . albuterol (PROVENTIL HFA;VENTOLIN HFA) 108 (90 Base) MCG/ACT inhaler 1-2 puff  1-2 puff Inhalation Q6H PRN Chauncey Mann, MD   2 puff at 07/04/18 0854  . alum & mag hydroxide-simeth (MAALOX/MYLANTA) 200-200-20 MG/5ML suspension 30 mL  30 mL Oral Q4H PRN Clapacs, John T, MD      . azithromycin (ZITHROMAX) tablet 250 mg  250 mg Oral Daily Clapacs, Madie Reno, MD   250 mg at 07/04/18 0855  . cloNIDine (CATAPRES) tablet 0.1 mg  0.1 mg Oral BID PRN Chauncey Mann, MD   0.1 mg at 07/03/18 1847  . cyanocobalamin ((VITAMIN B-12)) injection 1,000 mcg  1,000 mcg Intramuscular Daily Chauncey Mann, MD   1,000 mcg at 07/04/18 0855  . hydrALAZINE (APRESOLINE) tablet 25 mg  25 mg Oral Q8H Epifanio Lesches, MD   25 mg at 07/04/18 4742  . hydrOXYzine (ATARAX/VISTARIL) tablet 25 mg  25 mg Oral Q6H PRN Chauncey Mann, MD      . lisinopril (PRINIVIL,ZESTRIL) tablet 20 mg  20 mg Oral Daily Clapacs, Madie Reno, MD   20 mg at 07/04/18 0854  . loperamide (IMODIUM) capsule 2-4 mg  2-4 mg Oral PRN Chauncey Mann, MD      . LORazepam (ATIVAN) tablet 1 mg  1 mg Oral Q6H PRN Chauncey Mann, MD   1 mg at 07/04/18 0300  . lurasidone (LATUDA) tablet 40 mg  40 mg Oral Q breakfast Clapacs, Madie Reno, MD   40 mg at 07/04/18 0855  . magnesium hydroxide (MILK OF MAGNESIA) suspension 30 mL  30 mL Oral Daily PRN Clapacs, John T, MD      . metoprolol succinate (TOPROL-XL) 24 hr tablet 50 mg  50 mg Oral Daily Chauncey Mann, MD   50 mg at 07/04/18 0854  . multivitamin with minerals tablet 1 tablet  1 tablet Oral Daily Chauncey Mann, MD   1 tablet at 07/04/18 0855  . sertraline (ZOLOFT) tablet 100 mg  100 mg Oral Daily Clapacs, Madie Reno, MD   100 mg at 07/04/18 0855  . thiamine (VITAMIN B-1) tablet 100 mg  100 mg Oral Daily Chauncey Mann, MD   100 mg at 07/04/18 0855  . traZODone (DESYREL) tablet 100 mg   100 mg Oral QHS PRN Clapacs, Madie Reno, MD   100 mg at 07/03/18 2107  . [START ON 07/06/2018] vitamin B-12 (CYANOCOBALAMIN) tablet 1,000 mcg  1,000 mcg Oral Daily  Chauncey Mann, MD       PTA Medications: Medications Prior to Admission  Medication Sig Dispense Refill Last Dose  . albuterol (PROVENTIL HFA;VENTOLIN HFA) 108 (90 Base) MCG/ACT inhaler Inhale 2 puffs into the lungs every 6 (six) hours as needed for wheezing or shortness of breath. 1 Inhaler 2   . azithromycin (ZITHROMAX) 250 MG tablet For 4 days 4 each 0   . cyclobenzaprine (FLEXERIL) 10 MG tablet Take 10 mg by mouth daily.  1   . fluticasone (FLONASE) 50 MCG/ACT nasal spray PLACE 1 SPRAY IN EACH NOSTRIL D  1 Taking  . lisinopril (PRINIVIL,ZESTRIL) 20 MG tablet Take 1 tablet (20 mg total) by mouth daily. 30 tablet 2   . lurasidone (LATUDA) 40 MG TABS tablet Take 1 tablet (40 mg total) by mouth daily with breakfast. 30 tablet    . metoprolol succinate (TOPROL-XL) 50 MG 24 hr tablet Take 1 tablet (50 mg total) by mouth daily. Take with or immediately following a meal. 30 tablet 2   . pantoprazole (PROTONIX) 40 MG tablet Take 1 tablet (40 mg total) by mouth 2 (two) times daily before a meal. 30 tablet 3   . rizatriptan (MAXALT) 10 MG tablet Take 10 mg by mouth as needed for migraine. May repeat in 2 hours if needed   Taking  . simvastatin (ZOCOR) 40 MG tablet Take 40 mg by mouth daily.   Taking    Musculoskeletal: Strength & Muscle Tone: within normal limits Gait & Station: normal Patient leans: N/A  Psychiatric Specialty Exam: Physical Exam  Nursing note and vitals reviewed.   Review of Systems  Constitutional: Negative.   HENT: Negative.   Eyes: Negative.   Respiratory: Positive for cough and shortness of breath. Negative for hemoptysis, sputum production and wheezing.        SOB and dry cough for past 1 day  Cardiovascular: Negative.   Gastrointestinal: Negative.   Musculoskeletal: Negative.   Skin: Negative.    Neurological: Negative.   Endo/Heme/Allergies: Negative.     Blood pressure (!) 152/80, pulse 80, temperature 97.8 F (36.6 C), temperature source Oral, resp. rate 18, height 5\' 6"  (1.676 m), weight 108.9 kg, SpO2 99 %.Body mass index is 38.74 kg/m.  General Appearance: Casual  Eye Contact: Good  Speech:  Clear and coherent, normal rate  Volume:  Normal  Mood:  " a little better"  Affect:  Improved, no longer tearful but still depressed  Thought Process:  Linear, logical and goal directed  Orientation:  Full (Time, Place, and Person)  Thought Content:  Logical and goal directed  Suicidal Thoughts: Denies and regrets overdose  Homicidal Thoughts:  No  Memory:  Immediate;   Fair Recent;   Fair Remote;   Fair  Judgement:  Intact  Insight:  Good  Psychomotor Activity:  Normal  Concentration:  Concentration: Fair and Attention Span: Fair  Recall:  [employer] of Knowledge:  Good  Language:  Good  Akathisia:  No  Handed:  Right  AIMS (if indicated):     Assets:  Communication Skills Desire for Improvement Financial Resources/Insurance Housing Physical Health Transportation Vocational/Educational  ADL's:  Intact  Cognition:  WNL  Sleep:  Number of Hours: 6.75    Treatment Plan Summary:  Bipolar disorder, type I: MRE Depressed Alcohol use disorder, moderate to severe Hypertension Hyperlipidemia GERD Severe: Legal charges, unemployed, financial problems  Lisa Vasquez is a 48 year old married Caucasian female with history of bipolar disorder who has been off  of mood stabilizer now for over 10 years.  She overdosed intentionally secondary to suicidal thoughts in the context of a number of different stressors and is now status post extubation.  She will be admitted to inpatient psychiatry for medication management, safety and stabilization.   Bipolar disorder, type I: Mood has improved today no suicidal thoughts She was started on Latuda 40 mg p.o. daily with dinner for  mood stabilization which she is tolerating well. Continue Zoloft 100 mg p.o. Hemoglobin A1c was 5.5 and total cholesterol was 213.   TSH was 5.0 and will repeat EKG showed a QTC of 433.  Alcohol use disorder: Alcohol level of 318 in the emergency room Started on Ativan per CIWA as well as multivitamin, thiamine and folic acid.  Detox is progressing well CIWA score this morning was 0 The patient was encouraged to enter a meaningful recovery program at the time of discharge.  Pneumonia: She does have some shortness of breath and dry cough today.  We will plan to continue Zithromax 250 mg p.o. daily.  Appreciate medicine input and medicine will be following the patient.    Anemia: Hg has improved to 8.3..  She was already given blood transfusions on medicine floor will monitor hemoglobin.  Low B12: Low B12 may be contributing to depression.  The patient also has severe anemia.  We will give B12 injections every 2 weeks and then oral B12 supplements  Hypertension: Blood pressures have improved over yesterday although they are still elevated.   She was started on hydralazine by medicine.  We will continue metoprolol XL 50 mg p.o. daily and lisinopril 20 mg p.o. daily.   We will monitor vital signs closely.    Hyperlipidemia: We will continue simvastatin 40 mg p.o. daily  Recent acute respiratory failure: We will continue Flonase and albuterol  Disposition: The patient does have a stable living situation but will need psychotropic medication management at the time of follow-up   Daily contact with patient to assess and evaluate symptoms and progress in treatment and Medication management                   Physician Treatment Plan for Primary Diagnosis: Long Term Goal(s): Improvement in symptoms so as ready for discharge  Short Term Goals: Ability to identify changes in lifestyle to reduce recurrence of condition will improve, Ability to verbalize feelings will improve, Ability  to demonstrate self-control will improve, Ability to identify and develop effective coping behaviors will improve, Ability to maintain clinical measurements within normal limits will improve, Compliance with prescribed medications will improve and Ability to identify triggers associated with substance abuse/mental health issues will improve  Physician Treatment Plan for Secondary Diagnosis: Active Problems:   Severe recurrent major depression without psychotic features ([facility])   Alcohol use disorder, moderate, dependence ([place])  Long Term Goal(s): Improvement in symptoms so as ready for discharge  Short Term Goals: Ability to identify changes in lifestyle to reduce recurrence of condition will improve, Ability to verbalize feelings will improve, Ability to demonstrate self-control will improve, Ability to identify and develop effective coping behaviors will improve, Ability to maintain clinical measurements within normal limits will improve, Compliance with prescribed medications will improve and Ability to identify triggers associated with substance abuse/mental health issues will improve  I certify that inpatient services furnished can reasonably be expected to improve the patient's condition.    Chauncey Mann, MD 3/15/202010:42 AM

## 2018-07-04 NOTE — Progress Notes (Signed)
Patient ID: Lisa Vasquez, female   DOB: March 27, 1971, 48 y.o.   MRN: [id number]  [facility] PROGRESS NOTE  Lisa Vasquez MOQ:[id number] DOB: 1971/03/09 DOA: 07/02/2018 PCP: Florina Ou Hita, PA-C  HPI/Subjective: Patient still has some shortness of breath and cough and some wheeze.  Objective: Vitals:   07/04/18 0605 07/04/18 0614  BP: (!) 152/80 (!) 152/80  Pulse: 80   Resp: 18   Temp: 97.8 F (36.6 C)   SpO2: 99%    No intake or output data in the 24 hours ending 07/04/18 1227 Filed Weights   07/02/18 1722  Weight: 108.9 kg    ROS: Review of Systems  Constitutional: Negative for chills and fever.  Eyes: Negative for blurred vision.  Respiratory: Positive for cough, shortness of breath and wheezing.   Cardiovascular: Negative for chest pain.  Gastrointestinal: Negative for abdominal pain, constipation, diarrhea, nausea and vomiting.  Genitourinary: Negative for dysuria.  Musculoskeletal: Negative for joint pain.  Neurological: Negative for dizziness and headaches.   Exam: Physical Exam  Constitutional: She is oriented to person, place, and time.  HENT:  Nose: No mucosal edema.  Mouth/Throat: No oropharyngeal exudate or posterior oropharyngeal edema.  Eyes: Pupils are equal, round, and reactive to light. Conjunctivae, EOM and lids are normal.  Neck: No JVD present. Carotid bruit is not present. No edema present. No thyroid mass and no thyromegaly present.  Cardiovascular: S1 normal and S2 normal. Exam reveals no gallop.  No murmur heard. Pulses:      Dorsalis pedis pulses are 2+ on the right side and 2+ on the left side.  Respiratory: No respiratory distress. She has no wheezes. She has no rhonchi. She has no rales.  Upper respiratory tract infection.  GI: Soft. Bowel sounds are normal. There is no abdominal tenderness.  Musculoskeletal:     Right ankle: She exhibits no swelling.     Left ankle: She exhibits no swelling.  Lymphadenopathy:    She has no  cervical adenopathy.  Neurological: She is alert and oriented to person, place, and time. No cranial nerve deficit.  Skin: Skin is warm. No rash noted. Nails show no clubbing.  Psychiatric:  Answers questions appropriately      Data Reviewed: Basic Metabolic Panel: Recent Labs  Lab 06/29/18 1858 06/30/18 0526 07/01/18 0539 07/01/18 0543 07/01/18 1457 07/02/18 0531  NA 140 143  --  139  --  138  K 3.9 3.5  --  2.9* 3.2* 3.1*  CL 107 117*  --  108  --  107  CO2 20* 18*  --  23  --  22  GLUCOSE 112* 106*  --  104*  --  101*  BUN 10 11  --  7  --  6  CREATININE 0.74 0.89  --  0.73  --  0.62  CALCIUM 8.3* 6.8*  --  7.2*  --  7.7*  MG  --   --  1.7  --   --   --    Liver Function Tests: Recent Labs  Lab 06/29/18 1858 06/30/18 0526  AST 99* 83*  ALT 40 37  ALKPHOS 64 46  BILITOT 0.3 0.5  PROT 7.7 5.7*  ALBUMIN 4.1 3.0*   CBC: Recent Labs  Lab 06/29/18 1858 06/30/18 0526 07/01/18 0543 07/01/18 1046 07/02/18 0531 07/03/18 1130  WBC 12.4* 11.9* 10.3 11.1* 8.4 8.3  NEUTROABS 5.0  --   --   --   --  6.4  HGB 10.0* 7.6* 7.1*  7.2* 7.8* 8.3*  HCT 34.4* 26.4* 24.7* 24.5* 26.0* 27.4*  MCV 80.2 82.8 81.3 80.1 80.2 81.1  PLT 471* 260 202 221 203 221   CBG: Recent Labs  Lab 06/30/18 1350 07/01/18 0828 07/01/18 1643 07/02/18 0026 07/02/18 0728  GLUCAP 104* 102* 117* 72 112*    Recent Results (from the past 240 hour(s))  Urine culture     Status: None   Collection Time: 06/29/18  6:58 PM  Result Value Ref Range Status   Specimen Description   Final    URINE, RANDOM Performed at [facility], [address] [place], [place] [address]    Special Requests   Final    NONE Performed at [facility], [address] [place], [place] [address]    Culture   Final    NO GROWTH Performed at [facility], [place] [address], [place], [place] [address]    Report Status 07/01/2018 FINAL  Final  MRSA PCR Screening     Status: None   Collection  Time: 06/29/18 11:49 PM  Result Value Ref Range Status   MRSA by PCR NEGATIVE NEGATIVE Final    Comment:        The GeneXpert MRSA Assay (FDA approved for NASAL specimens only), is one component of a comprehensive MRSA colonization surveillance program. It is not intended to diagnose MRSA infection nor to guide or monitor treatment for MRSA infections. Performed at [facility], [address] [place], [place] [address]   Culture, respiratory (non-expectorated)     Status: None   Collection Time: 06/30/18  2:21 AM  Result Value Ref Range Status   Specimen Description   Final    TRACHEAL ASPIRATE Performed at [facility], [address] [place], [place] [address]    Special Requests   Final    Normal Performed at [facility], [place], [place] [address]    Gram Stain   Final    RARE WBC PRESENT,BOTH PMN AND MONONUCLEAR RARE GRAM POSITIVE RODS RARE GRAM POSITIVE COCCI    Culture   Final    FEW Consistent with normal respiratory flora. Performed at [facility], [place] [address] [place], [place] [address]    Report Status 07/02/2018 FINAL  Final  CULTURE, BLOOD (ROUTINE X 2) w Reflex to ID Panel     Status: None (Preliminary result)   Collection Time: 06/30/18  5:26 AM  Result Value Ref Range Status   Specimen Description BLOOD RIGHT AC  Final   Special Requests   Final    BOTTLES DRAWN AEROBIC AND ANAEROBIC Blood Culture results may not be optimal due to an excessive volume of blood received in culture bottles   Culture   Final    NO GROWTH 4 DAYS Performed at [facility], [address], [place], [place] [address]    Report Status PENDING  Incomplete  CULTURE, BLOOD (ROUTINE X 2) w Reflex to ID Panel     Status: None (Preliminary result)   Collection Time: 06/30/18  5:33 AM  Result Value Ref Range Status   Specimen Description BLOOD RIGHT HAND  Final   Special Requests   Final    BOTTLES DRAWN AEROBIC  AND ANAEROBIC Blood Culture adequate volume   Culture   Final    NO GROWTH 4 DAYS Performed at [facility], [address], [place], [place] [address]    Report Status PENDING  Incomplete  Scheduled Meds: . albuterol  2 puff Inhalation Q6H  . azithromycin  250 mg Oral Daily  . cyanocobalamin  1,000 mcg Intramuscular Daily  . hydrALAZINE  25 mg Oral Q8H  . lisinopril  20 mg Oral Daily  . lurasidone  40 mg Oral Q breakfast  . metoprolol succinate  50 mg Oral Daily  . multivitamin with minerals  1 tablet Oral Daily  . sertraline  100 mg Oral Daily  . thiamine  100 mg Oral Daily  . [START ON 07/06/2018] vitamin B-12  1,000 mcg Oral Daily   Continuous Infusions:  Assessment/Plan:  1. Essential hypertension on lisinopril and metoprolol.  Blood pressure trending better. 2. Shortness of breath with aspiration pneumonitis.  Finish up course of Zithromax.  Albuterol inhaler. 3. Major depression treatment as per psychiatrist. 4. Iron deficiency anemia start ferrous sulfate 5. Obesity with a BMI of 38.74.  Weight loss needed  Code Status:     Code Status Orders  (From admission, onward)         Start     Ordered   07/02/18 1704  Full code  Continuous     07/02/18 1703        Code Status History    Date Active Date Inactive Code Status Order ID Comments User Context   06/30/2018 0027 07/02/2018 1658 Full Code [id number]  Lance Coon, MD Inpatient    Advance Directive Documentation     Most Recent Value  Type of Advance Directive  Healthcare Power of Attorney  Pre-existing out of facility DNR order (yellow form or pink MOST form)  -  "MOST" Form in Place?  -     Family Communication: As per psychiatrist Disposition Plan: To be determined  Antibiotics:  Zithromax  Time spent: 28 minutes  [place]

## 2018-07-04 NOTE — Progress Notes (Signed)
Patient's BP was taken at 1520 and it was 163/90, her scheduled BP medication was administered and will continue to be monitored. This Probation officer notified MD.

## 2018-07-05 ENCOUNTER — Other Ambulatory Visit: Payer: Self-pay

## 2018-07-05 ENCOUNTER — Inpatient Hospital Stay
Admission: AD | Admit: 2018-07-05 | Discharge: 2018-07-09 | DRG: 177 | Disposition: A | Discharge status: Home / Self Care | Payer: BLUE CROSS/BLUE SHIELD | Source: Ambulatory Visit | Attending: Family Medicine | Admitting: Family Medicine

## 2018-07-05 DIAGNOSIS — Z560 Unemployment, unspecified: Secondary | ICD-10-CM | POA: Diagnosis not present

## 2018-07-05 DIAGNOSIS — Y711 Therapeutic (nonsurgical) and rehabilitative cardiovascular devices associated with adverse incidents: Secondary | ICD-10-CM | POA: Diagnosis not present

## 2018-07-05 DIAGNOSIS — J690 Pneumonitis due to inhalation of food and vomit: Principal | ICD-10-CM | POA: Diagnosis present

## 2018-07-05 DIAGNOSIS — Z79899 Other long term (current) drug therapy: Secondary | ICD-10-CM

## 2018-07-05 DIAGNOSIS — J9690 Respiratory failure, unspecified, unspecified whether with hypoxia or hypercapnia: Secondary | ICD-10-CM | POA: Diagnosis present

## 2018-07-05 DIAGNOSIS — E785 Hyperlipidemia, unspecified: Secondary | ICD-10-CM | POA: Diagnosis present

## 2018-07-05 DIAGNOSIS — Y92230 Patient room in hospital as the place of occurrence of the external cause: Secondary | ICD-10-CM | POA: Diagnosis not present

## 2018-07-05 DIAGNOSIS — Z8249 Family history of ischemic heart disease and other diseases of the circulatory system: Secondary | ICD-10-CM

## 2018-07-05 DIAGNOSIS — Z825 Family history of asthma and other chronic lower respiratory diseases: Secondary | ICD-10-CM | POA: Diagnosis not present

## 2018-07-05 DIAGNOSIS — Z87891 Personal history of nicotine dependence: Secondary | ICD-10-CM

## 2018-07-05 DIAGNOSIS — Z653 Problems related to other legal circumstances: Secondary | ICD-10-CM | POA: Diagnosis not present

## 2018-07-05 DIAGNOSIS — F319 Bipolar disorder, unspecified: Secondary | ICD-10-CM | POA: Diagnosis not present

## 2018-07-05 DIAGNOSIS — J9621 Acute and chronic respiratory failure with hypoxia: Secondary | ICD-10-CM | POA: Diagnosis present

## 2018-07-05 DIAGNOSIS — R0600 Dyspnea, unspecified: Secondary | ICD-10-CM

## 2018-07-05 DIAGNOSIS — Z818 Family history of other mental and behavioral disorders: Secondary | ICD-10-CM

## 2018-07-05 DIAGNOSIS — E669 Obesity, unspecified: Secondary | ICD-10-CM | POA: Diagnosis present

## 2018-07-05 DIAGNOSIS — F1010 Alcohol abuse, uncomplicated: Secondary | ICD-10-CM | POA: Diagnosis present

## 2018-07-05 DIAGNOSIS — D509 Iron deficiency anemia, unspecified: Secondary | ICD-10-CM | POA: Diagnosis present

## 2018-07-05 DIAGNOSIS — Z6838 Body mass index (BMI) 38.0-38.9, adult: Secondary | ICD-10-CM

## 2018-07-05 DIAGNOSIS — F1020 Alcohol dependence, uncomplicated: Secondary | ICD-10-CM | POA: Diagnosis not present

## 2018-07-05 DIAGNOSIS — T827XXA Infection and inflammatory reaction due to other cardiac and vascular devices, implants and grafts, initial encounter: Secondary | ICD-10-CM | POA: Diagnosis not present

## 2018-07-05 DIAGNOSIS — Z7951 Long term (current) use of inhaled steroids: Secondary | ICD-10-CM

## 2018-07-05 DIAGNOSIS — R0902 Hypoxemia: Secondary | ICD-10-CM | POA: Diagnosis present

## 2018-07-05 DIAGNOSIS — L03113 Cellulitis of right upper limb: Secondary | ICD-10-CM | POA: Diagnosis not present

## 2018-07-05 DIAGNOSIS — I10 Essential (primary) hypertension: Secondary | ICD-10-CM | POA: Diagnosis present

## 2018-07-05 DIAGNOSIS — Z888 Allergy status to other drugs, medicaments and biological substances status: Secondary | ICD-10-CM

## 2018-07-05 LAB — CULTURE, BLOOD (ROUTINE X 2)
Culture: NO GROWTH
Culture: NO GROWTH
Special Requests: ADEQUATE

## 2018-07-05 MED ORDER — IPRATROPIUM-ALBUTEROL 0.5-2.5 (3) MG/3ML IN SOLN
3.0000 mL | Freq: Four times a day (QID) | RESPIRATORY_TRACT | Status: DC
  Administered 2018-07-05 – 2018-07-07 (×7): 3 mL via RESPIRATORY_TRACT
  Filled 2018-07-05 (×7): qty 3

## 2018-07-05 MED ORDER — HYDRALAZINE HCL 25 MG PO TABS
25.0000 mg | ORAL_TABLET | Freq: Three times a day (TID) | ORAL | Status: DC
  Administered 2018-07-05: 25 mg via ORAL
  Filled 2018-07-05: qty 1

## 2018-07-05 MED ORDER — SODIUM CHLORIDE 0.9 % IV SOLN
2.0000 g | INTRAVENOUS | Status: DC
  Administered 2018-07-05 – 2018-07-08 (×4): 2 g via INTRAVENOUS
  Filled 2018-07-05: qty 20
  Filled 2018-07-05 (×2): qty 2
  Filled 2018-07-05 (×2): qty 20

## 2018-07-05 MED ORDER — METHYLPREDNISOLONE SODIUM SUCC 125 MG IJ SOLR
125.0000 mg | Freq: Once | INTRAMUSCULAR | Status: AC
  Administered 2018-07-05: 125 mg via INTRAVENOUS
  Filled 2018-07-05: qty 2

## 2018-07-05 MED ORDER — METHYLPREDNISOLONE SODIUM SUCC 40 MG IJ SOLR
40.0000 mg | Freq: Every day | INTRAMUSCULAR | Status: DC
  Administered 2018-07-05 – 2018-07-06 (×2): 40 mg via INTRAVENOUS
  Filled 2018-07-05 (×2): qty 1

## 2018-07-05 MED ORDER — HYDRALAZINE HCL 25 MG PO TABS
25.0000 mg | ORAL_TABLET | Freq: Three times a day (TID) | ORAL | Status: DC
  Administered 2018-07-05 – 2018-07-06 (×5): 25 mg via ORAL
  Filled 2018-07-05 (×5): qty 1

## 2018-07-05 MED ORDER — PREDNISONE 20 MG PO TABS: 30.0000 mg | ORAL_TABLET | Freq: Every day | ORAL | Status: DC

## 2018-07-05 MED ORDER — ENOXAPARIN SODIUM 40 MG/0.4ML ~~LOC~~ SOLN
40.0000 mg | SUBCUTANEOUS | Status: DC
  Administered 2018-07-05: 40 mg via SUBCUTANEOUS
  Filled 2018-07-05: qty 0.4

## 2018-07-05 NOTE — Progress Notes (Signed)
Pt transferred to 1A , acute care unit. Pt stale with elevated BP but O2 sat 99%  on room air.

## 2018-07-05 NOTE — Consult Note (Signed)
[facility] Face-to-Face Psychiatry Consult   Reason for Consult:  Psychiatric Evaluation-evaluate for readmission to Behavioral Health unit once medically cleared Referring Physician:  Loney Hering, MD Patient Identification: Lisa Vasquez MRN:  [id number] Principal Diagnosis: <principal problem not specified> Diagnosis:  Active Problems:   Respiratory failure ([place])   Total Time spent with patient, reviewing chart, discussing plan of care with RN: 45 minutes  Subjective:   [name] is a 48 y.o. female patient transferred from behavioral health unit to medical floor due to hypoxia secondary to pneumonia (likely aspiration pneumonia s/p overdose).     HPI: Patient is a 48 yo married female with history of bipolar disorder who was initially admitted to [facility] after overdosing on klonopin.  Pt repeats much of what was discussed with psychiatry attending, Dr. Nicolasa Ducking during short stay on inpatient unit.  Per Dr. Waylan Boga admission note-" She does admit to problems with major mood swings including hypomanic and manic episodes with racing thoughts, decreased sleep with increased goal-directed behavior, spending sprees, and insomnia.  The patient says she will clean excessively at home when she gets manic.  These episodes do alternate with some depression including feelings of hopelessness, frequent crying spells and anhedonia.  Recently, the patient became more depressed in November and December after she resigned from her job.  The patient resigned because she had stolen credit cards from her colleagues and went on a spending spree in the context of mania.  The patient has multiple pending charges.  As a result of losing her job, she and her husband and daughter had to move to an apartment.  They also had to give up their dog because they moved to an apartment.  She denies any current active suicidal thoughts but did endorse suicidal thoughts at the time of the overdose.  She denies any current active or  passive homicidal thoughts.  No auditory or visual hallucinations.  No paranoid thoughts or delusions currently.  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  She currently drinks approximately 750 cc of wine per night.  She denies any illicit drug use.  She has been married now for over 81 years and has 32 year old daughter.  She lives with her husband and daughter in [place].  She has been unemployed since December."   Pt currently is tearful regarding her overdose.  She denies active suicidal thoughts. She denies homicidal ideation.  She admits to history of excessive alcohol use.  She states that she was drinking a bottle of 750 mg wine daily.  On day of overdose, she states she drank "two bottaboxes of wine" which is the equivalent of 3 "regular size bottles of wine".  Pt states that she has been drinking in excess x 15 yrs.  The longest she's been sober over past 15 yrs, was during a 46 day period of [place] years ago.  Pt denies history of seizure withdrawals, but admits to history of blackouts.    Past Psychiatric History:  Short inpatient stay at [facility] (transferred to medical floor due to aspiration pneumonia/hypoxia).  Saw a psychiatrist at [facility] in [place] over 10 years ago.  Has been on  Zoloft for years prescribed by PCP.    Risk to Self:  yes; pt denies active SI, but had recent overdose Risk to Others:   pt denies HI Prior Inpatient Therapy:   pt denies Prior Outpatient Therapy:  yes  Past Medical History:  Past Medical  History:  Diagnosis Date  . Anemia   . Hypertension   . Skin cancer     Past Surgical History:  Procedure Laterality Date  . CERVICAL ABLATION    . EYE SURGERY    . NASAL SINUS SURGERY  2001  . STRABISMUS SURGERY Right   . WISDOM TOOTH EXTRACTION     Family History:  Family History  Problem Relation Age of Onset  . COPD Mother   . Skin cancer Mother   . Heart defect Mother   . Heart attack Maternal Aunt    . Heart attack Maternal Uncle   . Breast cancer Maternal Grandmother   . Melanoma Maternal Uncle    Family Psychiatric  History: pt states her 77 yo daughter has bipolar disorder and sees a Teacher, music at [employer].  Social History:  Social History   Substance and Sexual Activity  Alcohol Use Yes   Comment: "too much"     Social History   Substance and Sexual Activity  Drug Use Not Currently  . Types: Cocaine    Social History   Socioeconomic History  . Marital status: Married    Spouse name: Not on file  . Number of children: Not on file  . Years of education: Not on file  . Highest education level: Not on file  Occupational History  . Not on file  Social Needs  . Financial resource strain: Somewhat hard  . Food insecurity:    Worry: Often true    Inability: Often true  . Transportation needs:    Medical: Yes    Non-medical: Yes  Tobacco Use  . Smoking status: Former Research scientist (life sciences)  . Smokeless tobacco: Never Used  Substance and Sexual Activity  . Alcohol use: Yes    Comment: "too much"  . Drug use: Not Currently    Types: Cocaine  . Sexual activity: Not on file  Lifestyle  . Physical activity:    Days per week: 7 days    Minutes per session: 20 min  . Stress: Very much  Relationships  . Social connections:    Talks on phone: Never    Gets together: Never    Attends religious service: Never    Active member of club or organization: Yes    Attends meetings of clubs or organizations: Never    Relationship status: Married  Other Topics Concern  . Not on file  Social History Narrative  . Not on file   Substance abuse history: The patient has been drinking 750 mL of wine per day for at least the past 15 years.  She denies history of cannabis, cocaine, opioid, stimulant use or any other illicit substances.  She quit smoking cigarettes in 2004.    Additional Social History: Per Dr. Waylan Boga note: "The patient was born at and raised in [place], [place] by her mother and stepfather as she did not know her biological father.  She did have a close relationship with her parents and denies any history of any physical or sexual abuse.  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  She has been married for over 45 years and has one 61 year old daughter.  She and her husband and daughter live in [place]."    Legal history: Pt is facing multiple pending charges for  theft and fraud  Allergies:   Allergies  Allergen Reactions  . Ondansetron Other (See Comments)    Constipation     Labs:  Results for orders placed or performed during the hospital encounter of 07/02/18 (from the past 48 hour(s))  Blood gas, arterial     Status: Abnormal (Preliminary result)   Collection Time: 07/04/18  6:55 PM  Result Value Ref Range   FIO2 0.21    pH, Arterial 7.46 (H) 7.350 - 7.450   pCO2 arterial 35 32.0 - 48.0 mmHg   pO2, Arterial PENDING 83.0 - 108.0 mmHg   Bicarbonate 24.9 20.0 - 28.0 mmol/L   Acid-Base Excess 1.4 0.0 - 2.0 mmol/L   O2 Saturation 59.8 %   Patient temperature 37.0    Collection site LEFT RADIAL    Sample type VENOUS    Allens test (pass/fail) PASS PASS    Comment: Performed at [facility], [address] [place], [place] [address]   Mechanical Rate PENDING   CBC     Status: Abnormal   Collection Time: 07/04/18  8:32 PM  Result Value Ref Range   WBC 9.6 4.0 - 10.5 K/uL   RBC 3.49 (L) 3.87 - 5.11 MIL/uL   Hemoglobin 8.5 (L) 12.0 - 15.0 g/dL   HCT 28.8 (L) 36.0 - 46.0 %   MCV 82.5 80.0 - 100.0 fL   MCH 24.4 (L) 26.0 - 34.0 pg   MCHC 29.5 (L) 30.0 - 36.0 g/dL   RDW 16.9 (H) 11.5 - 15.5 %   Platelets 249 150 - 400 K/uL   nRBC 1.8 (H) 0.0 - 0.2 %    Comment: Performed at [facility], [address]  [place], [place] [address]  Comprehensive metabolic panel     Status: Abnormal   Collection Time: 07/04/18  8:32 PM  Result Value Ref Range   Sodium 136 135 - 145 mmol/L   Potassium 3.4 (L) 3.5 - 5.1 mmol/L   Chloride 104 98 - 111 mmol/L   CO2 24 22 - 32 mmol/L   Glucose, Bld 100 (H) 70 - 99 mg/dL   BUN 9 6 - 20 mg/dL   Creatinine, Ser 0.78 0.44 - 1.00 mg/dL   Calcium 8.8 (L) 8.9 - 10.3 mg/dL   Total Protein 6.5 6.5 - 8.1 g/dL   Albumin 3.4 (L) 3.5 - 5.0 g/dL   AST 22 15 - 41 U/L   ALT 18 0 - 44 U/L   Alkaline Phosphatase 53 38 - 126 U/L   Total Bilirubin 0.3 0.3 - 1.2 mg/dL   GFR calc non Af Amer >60 >60 mL/min   GFR calc Af Amer >60 >60 mL/min   Anion gap 8 5 - 15    Comment: Performed at [facility], [address] [place], [place] [address]    Current Facility-Administered Medications  Medication Dose Route Frequency Provider Last Rate Last Dose  . acetaminophen (TYLENOL) tablet 650 mg  650 mg Oral Q6H PRN Salary, Montell D, MD      . albuterol (PROVENTIL) (2.5 MG/3ML) 0.083% nebulizer solution 2.5 mg  2.5 mg Nebulization Q2H PRN Salary, Montell D, MD      . alum & mag hydroxide-simeth (MAALOX/MYLANTA) 200-200-20 MG/5ML suspension 30 mL  30 mL Oral Q4H PRN Salary, Montell D, MD      . azithromycin (ZITHROMAX) 500 mg in sodium chloride 0.9 % 250 mL IVPB  500 mg Intravenous Q24H Loney Hering D, MD 250 mL/hr at 07/05/18 0209  500 mg at 07/05/18 0209  . budesonide (PULMICORT) nebulizer solution 0.5 mg  0.5 mg Nebulization BID Salary, Montell D, MD   0.5 mg at 07/05/18 1106  . cloNIDine (CATAPRES) tablet 0.1 mg  0.1 mg Oral BID PRN Salary, Montell D, MD      . enoxaparin (LOVENOX) injection 40 mg  40 mg Subcutaneous Q24H Salary, Montell D, MD   40 mg at 07/05/18 0200  . ferrous sulfate tablet 325 mg  325 mg Oral BID WC Salary, Montell D, MD   325 mg at 07/05/18 1153  . fluticasone (FLONASE) 50 MCG/ACT nasal spray 2 spray  2 spray Each Nare Daily Salary, Montell D, MD   2  spray at 07/05/18 0955  . folic acid (FOLVITE) tablet 1 mg  1 mg Oral Daily Salary, Montell D, MD   1 mg at 07/05/18 1152  . hydrALAZINE (APRESOLINE) tablet 25 mg  25 mg Oral Q8H Salary, Montell D, MD   25 mg at 07/05/18 0956  . HYDROcodone-acetaminophen (NORCO/VICODIN) 5-325 MG per tablet 1-2 tablet  1-2 tablet Oral Q4H PRN Salary, Holly Bodily D, MD   1 tablet at 07/05/18 0956  . hydrOXYzine (ATARAX/VISTARIL) tablet 25 mg  25 mg Oral Q6H PRN Loney Hering D, MD   25 mg at 07/05/18 0957  . ipratropium-albuterol (DUONEB) 0.5-2.5 (3) MG/3ML nebulizer solution 3 mL  3 mL Nebulization QID Salary, Montell D, MD   3 mL at 07/05/18 1106  . lisinopril (PRINIVIL,ZESTRIL) tablet 20 mg  20 mg Oral Daily Salary, Montell D, MD   20 mg at 07/05/18 1153  . loperamide (IMODIUM) capsule 2-4 mg  2-4 mg Oral PRN Salary, Montell D, MD      . LORazepam (ATIVAN) tablet 1 mg  1 mg Oral Q6H PRN Salary, Avel Peace, MD       Or  . LORazepam (ATIVAN) injection 1 mg  1 mg Intravenous Q6H PRN Salary, Montell D, MD      . LORazepam (ATIVAN) tablet 1 mg  1 mg Oral Q6H PRN Salary, Montell D, MD      . lurasidone (LATUDA) tablet 40 mg  40 mg Oral Q breakfast Salary, Montell D, MD   40 mg at 07/05/18 0956  . magnesium hydroxide (MILK OF MAGNESIA) suspension 30 mL  30 mL Oral Daily PRN Salary, Montell D, MD      . methylPREDNISolone sodium succinate (SOLU-MEDROL) 40 mg/mL injection 40 mg  40 mg Intravenous Daily Wieting, Richard, MD      . metoprolol succinate (TOPROL-XL) 24 hr tablet 50 mg  50 mg Oral Daily Salary, Montell D, MD   50 mg at 07/05/18 1151  . multivitamin with minerals tablet 1 tablet  1 tablet Oral Daily Salary, Montell D, MD   1 tablet at 07/05/18 1151  . pantoprazole (PROTONIX) EC tablet 40 mg  40 mg Oral BID AC Salary, Montell D, MD   40 mg at 07/05/18 1152  . promethazine (PHENERGAN) tablet 12.5 mg  12.5 mg Oral Q6H PRN Salary, Montell D, MD      . sertraline (ZOLOFT) tablet 100 mg  100 mg Oral Daily Salary,  Montell D, MD      . simvastatin (ZOCOR) tablet 40 mg  40 mg Oral Daily Salary, Montell D, MD      . thiamine (VITAMIN B-1) tablet 100 mg  100 mg Oral Daily Salary, Montell D, MD       Or  . thiamine (B-1) injection 100 mg  100  mg Intravenous Daily Salary, Montell D, MD      . traZODone (DESYREL) tablet 100 mg  100 mg Oral QHS PRN Salary, Montell D, MD      . Derrill Memo ON 07/06/2018] vitamin B-12 (CYANOCOBALAMIN) tablet 1,000 mcg  1,000 mcg Oral Daily Salary, Montell D, MD        Musculoskeletal: Strength & Muscle Tone: within normal limits Gait & Station: normal Patient leans: N/A  Psychiatric Specialty Exam: Physical Exam  Constitutional: She is oriented to person, place, and time. She appears well-developed and well-nourished. No distress.  HENT:  Head: Normocephalic and atraumatic.  Cardiovascular: Normal rate and regular rhythm.  Respiratory: Effort normal and breath sounds normal.  GI: Bowel sounds are normal.  Musculoskeletal: Normal range of motion.  Neurological: She is oriented to person, place, and time.  Skin: Skin is warm and dry.  Psychiatric: Her speech is normal. Judgment normal. Her mood appears anxious. She is not agitated, not aggressive, not hyperactive, not actively hallucinating and not combative. Thought content is not paranoid. Cognition and memory are normal. Depressed: intermittent. She expresses no homicidal and no suicidal ideation.    Review of Systems  Constitutional: Negative for fever.  HENT: Negative.   Eyes: Negative.   Respiratory: Positive for shortness of breath (improving with treatment).   Cardiovascular: Negative.   Gastrointestinal: Negative.   Genitourinary: Negative.   Musculoskeletal: Negative.   Neurological: Negative.   Psychiatric/Behavioral: Negative for hallucinations, memory loss and suicidal ideas. The patient is nervous/anxious. The patient does not have insomnia.        Intermittent depression; feeling better since starting  latuda Excessive alcohol use    Blood pressure (!) 164/88, pulse 79, temperature (!) 97.5 F (36.4 C), temperature source Oral, resp. rate 18, SpO2 97 %.There is no height or weight on file to calculate BMI.  General Appearance: Casual and Well Groomed  Eye Contact:  Good  Speech:  Clear and Coherent and Normal Rate  Volume:  Normal  Mood:  "better"  Affect:  Tearful and but appropriate when discussing love for family and pet  Thought Process:  Coherent and Linear  Orientation:  Full (Time, Place, and Person)  Thought Content:  Logical  Suicidal Thoughts:  patient denies active SI  Homicidal Thoughts:  patient HI  Memory:  grossly intact  Judgement:  Intact  Insight:  Good  Psychomotor Activity:  Normal  Concentration:  Concentration: grossly intact and Attention Span: grossly intact  Recall:  intact  Fund of Knowledge:  Good  Language:  Good  Akathisia:  Negative  AIMS (if indicated):     Assets:  Communication Skills Desire for Improvement Housing Physical Health Resilience Social Support  ADL's:  Intact  Cognition:  WNL  Sleep:   improved per pt since starting [place] Summary: Daily contact with patient to assess and evaluate symptoms and progress in treatment, Medication management and Plan:  Patient transferred from behavioral health unit to medical floor due to hypoxia secondary to pneumonia (likely aspiration pneumonia s/p overdose).  Patient will likely need readmission to psychiatric unit once she is medically cleared.  During ambulation today, pt's oxygen desat'ed.  Due to ongoing infection, primary team ordered IV solumedrol.  Pt is on oral antibiotics.  Psychiatry will continue to follow patient and reassess for admission once she is medically cleared.  Continue with sitter.  Continue latuda 40 mg qam breakfast. Pt would like to continue zoloft 100 mg daily as well.  Discussed that given she is on a mood stabilizer, I will continue zoloft; however  there is increase risk of mania with use of antidepressant medication in people with bipolar disorder.  Patient voices understanding.  Recommend close monitoring of patient for resurgence of manic symtpoms.  Discussed risks and benefits of sertraline and latuda with patient (including but not limited to risk of neuromalignant syndrome with antipsychotic medication).  Pt voices understanding and consents to the current treatment plan.  Continue alcohol withdrawal protocol.    Disposition: Recommend psychiatric Inpatient admission when medically cleared. Supportive therapy provided about ongoing stressors.   Tennis Ship, MD 07/05/2018 1:14 PM

## 2018-07-05 NOTE — Progress Notes (Signed)
   07/05/18 1500  Clinical Encounter Type  Visited With Patient  Visit Type Initial  Referral From Nurse   Chaplain received a referral from the Unit Director to check in with this patient. Upon arrival, the patient was sitting up in bed with paperwork at her bedside table. A sitter was present and seated in the recliner in the far left corner of the room. A gentleman was standing at the bedside table. The chaplain introduced herself and seemed alarmed that I had come to check in on her. She politely declined the visit. No additional needs identified at this time.

## 2018-07-05 NOTE — Progress Notes (Signed)
Patient ID: Lisa Vasquez, female   DOB: 12-Dec-1970, 48 y.o.   MRN: [id number]  [facility] PROGRESS NOTE  Lisa Vasquez IOX:[id number] DOB: 1970/08/28 DOA: 07/05/2018 PCP: Lisa Ou Hita, PA-C  HPI/Subjective: Patient last night got very short of breath with walking back-and-forth and required oxygen.  Patient currently off oxygen at rest.  Some cough and wheeze.  Objective: Vitals:   07/05/18 0808 07/05/18 1316  BP: (!) 164/88   Pulse: 79   Resp: 18   Temp: (!) 97.5 F (36.4 C)   SpO2: 97% (!) 88%    There were no vitals filed for this visit.  ROS: Review of Systems  Constitutional: Negative for chills and fever.  Eyes: Negative for blurred vision.  Respiratory: Positive for cough and shortness of breath.   Cardiovascular: Negative for chest pain.  Gastrointestinal: Negative for abdominal pain, constipation, diarrhea, nausea and vomiting.  Genitourinary: Negative for dysuria.  Musculoskeletal: Negative for joint pain.  Neurological: Negative for dizziness and headaches.   Exam: Physical Exam  Constitutional: She is oriented to person, place, and time.  HENT:  Nose: No mucosal edema.  Mouth/Throat: No oropharyngeal exudate or posterior oropharyngeal edema.  Eyes: Pupils are equal, round, and reactive to light. Conjunctivae, EOM and lids are normal.  Neck: No JVD present. Carotid bruit is not present. No edema present. No thyroid mass and no thyromegaly present.  Cardiovascular: S1 normal and S2 normal. Exam reveals no gallop.  No murmur heard. Pulses:      Dorsalis pedis pulses are 2+ on the right side and 2+ on the left side.  Respiratory: No respiratory distress. She has decreased breath sounds in the right lower field and the left lower field. She has no wheezes. She has no rhonchi. She has no rales.  Coarse breath sounds bilaterally  GI: Soft. Bowel sounds are normal. There is no abdominal tenderness.  Musculoskeletal:     Right ankle: She exhibits no  swelling.     Left ankle: She exhibits no swelling.  Lymphadenopathy:    She has no cervical adenopathy.  Neurological: She is alert and oriented to person, place, and time. No cranial nerve deficit.  Skin: Skin is warm. No rash noted. Nails show no clubbing.  Psychiatric: She has a normal mood and affect.      Data Reviewed: Basic Metabolic Panel: Recent Labs  Lab 06/29/18 1858 06/30/18 0526 07/01/18 0539 07/01/18 0543 07/01/18 1457 07/02/18 0531 07/04/18 2032  NA 140 143  --  139  --  138 136  K 3.9 3.5  --  2.9* 3.2* 3.1* 3.4*  CL 107 117*  --  108  --  107 104  CO2 20* 18*  --  23  --  22 24  GLUCOSE 112* 106*  --  104*  --  101* 100*  BUN 10 11  --  7  --  6 9  CREATININE 0.74 0.89  --  0.73  --  0.62 0.78  CALCIUM 8.3* 6.8*  --  7.2*  --  7.7* 8.8*  MG  --   --  1.7  --   --   --   --    Liver Function Tests: Recent Labs  Lab 06/29/18 1858 06/30/18 0526 07/04/18 2032  AST 99* 83* 22  ALT 40 37 18  ALKPHOS 64 46 53  BILITOT 0.3 0.5 0.3  PROT 7.7 5.7* 6.5  ALBUMIN 4.1 3.0* 3.4*   CBC: Recent Labs  Lab 06/29/18 1858  07/01/18 0543 07/01/18 1046 07/02/18 0531 07/03/18 1130 07/04/18 2032  WBC 12.4*   < > 10.3 11.1* 8.4 8.3 9.6  NEUTROABS 5.0  --   --   --   --  6.4  --   HGB 10.0*   < > 7.1* 7.2* 7.8* 8.3* 8.5*  HCT 34.4*   < > 24.7* 24.5* 26.0* 27.4* 28.8*  MCV 80.2   < > 81.3 80.1 80.2 81.1 82.5  PLT 471*   < > 202 221 203 221 249   < > = values in this interval not displayed.    CBG: Recent Labs  Lab 06/30/18 1350 07/01/18 0828 07/01/18 1643 07/02/18 0026 07/02/18 0728  GLUCAP 104* 102* 117* 72 112*    Recent Results (from the past 240 hour(s))  Urine culture     Status: None   Collection Time: 06/29/18  6:58 PM  Result Value Ref Range Status   Specimen Description   Final    URINE, RANDOM Performed at [facility], [address] [place], [place] [address]    Special Requests   Final    NONE Performed at [facility], [address], [place], [place] [address]    Culture   Final    NO GROWTH Performed at [facility], [place] [address] [place], [place] [address]    Report Status 07/01/2018 FINAL  Final  MRSA PCR Screening     Status: None   Collection Time: 06/29/18 11:49 PM  Result Value Ref Range Status   MRSA by PCR NEGATIVE NEGATIVE Final    Comment:        The GeneXpert MRSA Assay (FDA approved for NASAL specimens only), is one component of a comprehensive MRSA colonization surveillance program. It is not intended to diagnose MRSA infection nor to guide or monitor treatment for MRSA infections. Performed at [facility], [address] [place], [place] [address]   Culture, respiratory (non-expectorated)     Status: None   Collection Time: 06/30/18  2:21 AM  Result Value Ref Range Status   Specimen Description   Final    TRACHEAL ASPIRATE Performed at [facility], [address], [place], [place] [address]    Special Requests   Final    Normal Performed at [facility], [place], [place] [address]    Gram Stain   Final    RARE WBC PRESENT,BOTH PMN AND MONONUCLEAR RARE GRAM POSITIVE RODS RARE GRAM POSITIVE COCCI    Culture   Final    FEW Consistent with normal respiratory flora. Performed at [facility], [place] [address] [place], [place] [address]    Report Status 07/02/2018 FINAL  Final  CULTURE, BLOOD (ROUTINE X 2) w Reflex to ID Panel     Status: None   Collection Time: 06/30/18  5:26 AM  Result Value Ref Range Status   Specimen Description BLOOD RIGHT AC  Final   Special Requests   Final    BOTTLES DRAWN AEROBIC AND ANAEROBIC Blood Culture results may not be optimal due to an excessive volume of blood received in culture bottles   Culture   Final    NO GROWTH 5 DAYS Performed at [facility], [address] [place], [place] [address]    Report Status 07/05/2018 FINAL  Final  CULTURE,  BLOOD (ROUTINE X 2) w Reflex to ID Panel     Status: None   Collection Time: 06/30/18  5:33  AM  Result Value Ref Range Status   Specimen Description BLOOD RIGHT HAND  Final   Special Requests   Final    BOTTLES DRAWN AEROBIC AND ANAEROBIC Blood Culture adequate volume   Culture   Final    NO GROWTH 5 DAYS Performed at [facility], [address] [place], [place] [address]    Report Status 07/05/2018 FINAL  Final     Studies: Dg Chest Port 1 View  Result Date: 07/04/2018 CLINICAL DATA:  Shortness of Breath EXAM: PORTABLE CHEST 1 VIEW COMPARISON:  07/01/2018 FINDINGS: Lobation of the right hemidiaphragm. Heart is normal size. Mild peribronchial thickening. No confluent opacities or effusions. No acute bony abnormality. IMPRESSION: Mild bronchitic changes. Electronically Signed   By: Rolm Baptise M.D.   On: 07/04/2018 19:13    Scheduled Meds: . budesonide (PULMICORT) nebulizer solution  0.5 mg Nebulization BID  . ferrous sulfate  325 mg Oral BID WC  . fluticasone  2 spray Each Nare Daily  . folic acid  1 mg Oral Daily  . hydrALAZINE  25 mg Oral Q8H  . ipratropium-albuterol  3 mL Nebulization QID  . lisinopril  20 mg Oral Daily  . lurasidone  40 mg Oral Q breakfast  . methylPREDNISolone (SOLU-MEDROL) injection  40 mg Intravenous Daily  . metoprolol succinate  50 mg Oral Daily  . multivitamin with minerals  1 tablet Oral Daily  . pantoprazole  40 mg Oral BID AC  . sertraline  100 mg Oral Daily  . simvastatin  40 mg Oral Daily  . thiamine  100 mg Oral Daily   Or  . thiamine  100 mg Intravenous Daily  . [START ON 07/06/2018] vitamin B-12  1,000 mcg Oral Daily   Continuous Infusions: . azithromycin 500 mg (07/05/18 0209)    Assessment/Plan:  1. Acute hypoxic respiratory failure.  Patient sent up from psychiatric unit.  Was on oxygen but now currently off.  Did desaturate with ambulation down to about 84%.  Continue to ambulate on a daily basis. 2. Aspiration  pneumonitis.  Started steroids and nebulizer treatments.  Finish up course of Zithromax. 3. Hypertension on lisinopril, hydralazine and metoprolol 4. Hyperlipidemia unspecified on simvastatin 5. Depression and bipolar disorder continue psychiatric treatment 6. Obesity with a BMI of 38.74.  Weight loss needed  Code Status:     Code Status Orders  (From admission, onward)         Start     Ordered   07/04/18 2213  Full code  Continuous     07/04/18 2212        Code Status History    Date Active Date Inactive Code Status Order ID Comments User Context   07/04/2018 2212 07/04/2018 2212 Full Code [id number]  SalaryAvel Peace, MD Inpatient   07/02/2018 1703 07/04/2018 2212 Full Code [id number]  Gonzella Lex, MD Inpatient   06/30/2018 0027 07/02/2018 1658 Full Code [id number]  Lance Coon, MD Inpatient     Disposition Plan: Once hold saturations with ambulation can either go back to psychiatric floor or be discharged.  Consultants:  Psychiatry  Antibiotics:  Zithromax  Time spent: 25 minutes  [place]

## 2018-07-05 NOTE — Progress Notes (Signed)
Patient transfered to room 150 with the diagnosis of respiratory failure. A & O x 4. Denied any SOB at this time. No contraband or illicit drug found on patient during skin assessment. Safety siiter at bedside. Will continue to monitor.

## 2018-07-05 NOTE — Plan of Care (Signed)
All patient belongings  sent with patient.

## 2018-07-06 ENCOUNTER — Inpatient Hospital Stay: Admission: AD | Admit: 2018-07-06 | Payer: BLUE CROSS/BLUE SHIELD | Admitting: Nurse Practitioner

## 2018-07-06 LAB — CBC
HCT: 29.2 % — ABNORMAL LOW (ref 36.0–46.0)
Hemoglobin: 8.6 g/dL — ABNORMAL LOW (ref 12.0–15.0)
MCH: 24.3 pg — ABNORMAL LOW (ref 26.0–34.0)
MCHC: 29.5 g/dL — ABNORMAL LOW (ref 30.0–36.0)
MCV: 82.5 fL (ref 80.0–100.0)
Platelets: 240 10*3/uL (ref 150–400)
RBC: 3.54 MIL/uL — ABNORMAL LOW (ref 3.87–5.11)
RDW: 18.2 % — ABNORMAL HIGH (ref 11.5–15.5)
WBC: 10.1 10*3/uL (ref 4.0–10.5)
nRBC: 0.8 % — ABNORMAL HIGH (ref 0.0–0.2)

## 2018-07-06 LAB — FERRITIN: Ferritin: 165 ng/mL (ref 11–307)

## 2018-07-06 MED ORDER — DOXYCYCLINE HYCLATE 100 MG PO TABS
100.0000 mg | ORAL_TABLET | Freq: Two times a day (BID) | ORAL | Status: AC
  Administered 2018-07-06 – 2018-07-08 (×5): 100 mg via ORAL
  Filled 2018-07-06 (×5): qty 1

## 2018-07-06 MED ORDER — LISINOPRIL 20 MG PO TABS
20.0000 mg | ORAL_TABLET | Freq: Two times a day (BID) | ORAL | Status: DC
  Administered 2018-07-06 – 2018-07-09 (×6): 20 mg via ORAL
  Filled 2018-07-06 (×6): qty 1

## 2018-07-06 MED ORDER — PREDNISONE 20 MG PO TABS
40.0000 mg | ORAL_TABLET | Freq: Every day | ORAL | Status: AC
  Administered 2018-07-07 – 2018-07-08 (×2): 40 mg via ORAL
  Filled 2018-07-06 (×2): qty 2

## 2018-07-06 MED ORDER — CLONIDINE HCL 0.1 MG PO TABS
0.1000 mg | ORAL_TABLET | Freq: Two times a day (BID) | ORAL | Status: DC
  Administered 2018-07-06 (×2): 0.1 mg via ORAL
  Filled 2018-07-06 (×2): qty 1

## 2018-07-06 MED ORDER — HYDRALAZINE HCL 50 MG PO TABS
50.0000 mg | ORAL_TABLET | Freq: Three times a day (TID) | ORAL | Status: DC
  Administered 2018-07-06 – 2018-07-09 (×9): 50 mg via ORAL
  Filled 2018-07-06 (×9): qty 1

## 2018-07-06 NOTE — Progress Notes (Signed)
Patient ID: Lisa Vasquez, female   DOB: 1970-04-30, 48 y.o.   MRN: [id number]  [employer] PROGRESS NOTE  Lisa Vasquez XIP:[id number] DOB: 10/03/1970 DOA: 07/05/2018 PCP: Lisa Ou Hita, PA-C  HPI/Subjective: Patient feeling a little bit better.  Some cough and some shortness of breath.  Was able to walk around the nursing station.  Objective: Vitals:   07/06/18 1402 07/06/18 1410  BP: 112/84 (!) 168/100  Pulse: 65 76  Resp: 18 18  Temp:  (!) 97.4 F (36.3 C)  SpO2: 100% 98%     ROS: Review of Systems  Constitutional: Negative for chills and fever.  Eyes: Negative for blurred vision.  Respiratory: Positive for cough and shortness of breath.   Cardiovascular: Negative for chest pain.  Gastrointestinal: Negative for abdominal pain, constipation, diarrhea, nausea and vomiting.  Genitourinary: Negative for dysuria.  Musculoskeletal: Negative for joint pain.  Neurological: Negative for dizziness and headaches.   Exam: Physical Exam  Constitutional: She is oriented to person, place, and time.  HENT:  Nose: No mucosal edema.  Mouth/Throat: No oropharyngeal exudate or posterior oropharyngeal edema.  Eyes: Pupils are equal, round, and reactive to light. Conjunctivae, EOM and lids are normal.  Neck: No JVD present. Carotid bruit is not present. No edema present. No thyroid mass and no thyromegaly present.  Cardiovascular: S1 normal and S2 normal. Exam reveals no gallop.  No murmur heard. Pulses:      Dorsalis pedis pulses are 2+ on the right side and 2+ on the left side.  Respiratory: No respiratory distress. She has decreased breath sounds in the right lower field and the left lower field. She has no wheezes. She has no rhonchi. She has no rales.  GI: Soft. Bowel sounds are normal. There is no abdominal tenderness.  Musculoskeletal:     Right ankle: She exhibits no swelling.     Left ankle: She exhibits no swelling.  Lymphadenopathy:    She has no cervical  adenopathy.  Neurological: She is alert and oriented to person, place, and time. No cranial nerve deficit.  Skin: Skin is warm. No rash noted. Nails show no clubbing.  Psychiatric: She has a normal mood and affect.      Data Reviewed: Basic Metabolic Panel: Recent Labs  Lab 06/29/18 1858 06/30/18 0526 07/01/18 0539 07/01/18 0543 07/01/18 1457 07/02/18 0531 07/04/18 2032  NA 140 143  --  139  --  138 136  K 3.9 3.5  --  2.9* 3.2* 3.1* 3.4*  CL 107 117*  --  108  --  107 104  CO2 20* 18*  --  23  --  22 24  GLUCOSE 112* 106*  --  104*  --  101* 100*  BUN 10 11  --  7  --  6 9  CREATININE 0.74 0.89  --  0.73  --  0.62 0.78  CALCIUM 8.3* 6.8*  --  7.2*  --  7.7* 8.8*  MG  --   --  1.7  --   --   --   --    Liver Function Tests: Recent Labs  Lab 06/29/18 1858 06/30/18 0526 07/04/18 2032  AST 99* 83* 22  ALT 40 37 18  ALKPHOS 64 46 53  BILITOT 0.3 0.5 0.3  PROT 7.7 5.7* 6.5  ALBUMIN 4.1 3.0* 3.4*   CBC: Recent Labs  Lab 06/29/18 1858  07/01/18 1046 07/02/18 0531 07/03/18 1130 07/04/18 2032 07/06/18 0415  WBC 12.4*   < >  11.1* 8.4 8.3 9.6 10.1  NEUTROABS 5.0  --   --   --  6.4  --   --   HGB 10.0*   < > 7.2* 7.8* 8.3* 8.5* 8.6*  HCT 34.4*   < > 24.5* 26.0* 27.4* 28.8* 29.2*  MCV 80.2   < > 80.1 80.2 81.1 82.5 82.5  PLT 471*   < > 221 203 221 249 240   < > = values in this interval not displayed.    CBG: Recent Labs  Lab 06/30/18 1350 07/01/18 0828 07/01/18 1643 07/02/18 0026 07/02/18 0728  GLUCAP 104* 102* 117* 72 112*    Recent Results (from the past 240 hour(s))  Urine culture     Status: None   Collection Time: 06/29/18  6:58 PM  Result Value Ref Range Status   Specimen Description   Final    URINE, RANDOM Performed at [facility], [address], [place], [place] [address]    Special Requests   Final    NONE Performed at [facility], [address], [place], [place] [address]    Culture   Final    NO  GROWTH Performed at [facility], [place] [address] [place], [place] [address]    Report Status 07/01/2018 FINAL  Final  MRSA PCR Screening     Status: None   Collection Time: 06/29/18 11:49 PM  Result Value Ref Range Status   MRSA by PCR NEGATIVE NEGATIVE Final    Comment:        The GeneXpert MRSA Assay (FDA approved for NASAL specimens only), is one component of a comprehensive MRSA colonization surveillance program. It is not intended to diagnose MRSA infection nor to guide or monitor treatment for MRSA infections. Performed at [facility], [address] [place], [place] [address]   Culture, respiratory (non-expectorated)     Status: None   Collection Time: 06/30/18  2:21 AM  Result Value Ref Range Status   Specimen Description   Final    TRACHEAL ASPIRATE Performed at [facility], [address], [place], [place] [address]    Special Requests   Final    Normal Performed at [facility], [place], [place] [address]    Gram Stain   Final    RARE WBC PRESENT,BOTH PMN AND MONONUCLEAR RARE GRAM POSITIVE RODS RARE GRAM POSITIVE COCCI    Culture   Final    FEW Consistent with normal respiratory flora. Performed at [facility], [place] [address], [place], [place] [address]    Report Status 07/02/2018 FINAL  Final  CULTURE, BLOOD (ROUTINE X 2) w Reflex to ID Panel     Status: None   Collection Time: 06/30/18  5:26 AM  Result Value Ref Range Status   Specimen Description BLOOD RIGHT AC  Final   Special Requests   Final    BOTTLES DRAWN AEROBIC AND ANAEROBIC Blood Culture results may not be optimal due to an excessive volume of blood received in culture bottles   Culture   Final    NO GROWTH 5 DAYS Performed at [facility], [address] [place],  [address]    Report Status 07/05/2018 FINAL  Final  CULTURE, BLOOD (ROUTINE X 2) w Reflex to ID Panel     Status: None   Collection Time: 06/30/18   5:33 AM  Result Value Ref Range Status   Specimen Description BLOOD RIGHT HAND  Final  Special Requests   Final    BOTTLES DRAWN AEROBIC AND ANAEROBIC Blood Culture adequate volume   Culture   Final    NO GROWTH 5 DAYS Performed at [facility], [address] [place], [place] [address]    Report Status 07/05/2018 FINAL  Final     Studies: Dg Chest Port 1 View  Result Date: 07/04/2018 CLINICAL DATA:  Shortness of Breath EXAM: PORTABLE CHEST 1 VIEW COMPARISON:  07/01/2018 FINDINGS: Lobation of the right hemidiaphragm. Heart is normal size. Mild peribronchial thickening. No confluent opacities or effusions. No acute bony abnormality. IMPRESSION: Mild bronchitic changes. Electronically Signed   By: Rolm Baptise M.D.   On: 07/04/2018 19:13    Scheduled Meds: . budesonide (PULMICORT) nebulizer solution  0.5 mg Nebulization BID  . cloNIDine  0.1 mg Oral BID  . ferrous sulfate  325 mg Oral BID WC  . fluticasone  2 spray Each Nare Daily  . folic acid  1 mg Oral Daily  . hydrALAZINE  50 mg Oral TID  . ipratropium-albuterol  3 mL Nebulization QID  . lisinopril  20 mg Oral Daily  . lurasidone  40 mg Oral Q breakfast  . metoprolol succinate  50 mg Oral Daily  . multivitamin with minerals  1 tablet Oral Daily  . pantoprazole  40 mg Oral BID AC  . [START ON 07/07/2018] predniSONE  40 mg Oral Q breakfast  . sertraline  100 mg Oral Daily  . simvastatin  40 mg Oral Daily  . thiamine  100 mg Oral Daily   Or  . thiamine  100 mg Intravenous Daily  . vitamin B-12  1,000 mcg Oral Daily   Continuous Infusions: . azithromycin Stopped (07/05/18 2150)  . cefTRIAXone (ROCEPHIN)  IV Stopped (07/05/18 1656)    Assessment/Plan:  1. Acute hypoxic respiratory failure.  Patient sent up from psychiatric unit for desaturation down to 84%.  I walked the patient around personally today around the nursing station twice and pulse ox stayed 95%. 2. Aspiration pneumonitis.  Switch Solu-Medrol over  to prednisone 40 mg daily for 2 more days then stop.  Patient will finish up Zithromax.  Started on Rocephin yesterday. 3. IV cellulitis.  Improved with Rocephin.  When he gets down to the psychiatric unit I will probably switch over to doxycycline. 4. Hypertension.  Blood pressure elevated today.  Try to get blood pressure little bit better prior to getting down to the psychiatric unit.  On lisinopril, I increased hydralazine and metoprolol.  Patient is also on as needed clonidine.  Nursing staff to check another blood pressure and then I will decide whether or not the blood pressure stable enough to go to the psychiatric unit or not. 5. Hyperlipidemia unspecified on simvastatin 6. Depression and bipolar disorder continue psychiatric treatment 7. Obesity with a BMI of 38.74.  Weight loss needed  Code Status:     Code Status Orders  (From admission, onward)         Start     Ordered   07/04/18 2213  Full code  Continuous     07/04/18 2212        Code Status History    Date Active Date Inactive Code Status Order ID Comments User Context   07/04/2018 2212 07/04/2018 2212 Full Code [id number]  SalaryAvel Peace, MD Inpatient   07/02/2018 1703 07/04/2018 2212 Full Code [id number]  Gonzella Lex, MD Inpatient   06/30/2018 0027 07/02/2018 1658 Full Code [id number]  Lance Coon,  MD Inpatient     Disposition Plan: Psychiatry wants patient to get down to the psychiatric unit once medically stable.  Consultants:  Psychiatry  Antibiotics:  Zithromax  And Rocephin  Time spent: 32 minutes  [place]

## 2018-07-06 NOTE — BH Assessment (Signed)
Patient is to be admitted to [facility] by Dr. Audie Box.  Attending Physician will be Dr. Weber Cooks.   Patient has been assigned to room 303, by [place].   Preadmitted by Gust Rung.

## 2018-07-06 NOTE — Progress Notes (Signed)
Patient ID: Lisa Vasquez, female   DOB: [birth year], 48 y.o.   MRN: [id number]  Repeat blood pressure higher.  Continue increased dose of hydralazine.  Increase lisinopril to twice daily.  Continue metoprolol.  Has PRN clonidine for high blood pressure.  Monitor on the medicine unit overnight.  Dr Loletha Grayer

## 2018-07-06 NOTE — Consult Note (Addendum)
[facility] Face-to-Face Psychiatry Consult Follow up  Reason for Consult:  Psychiatric Evaluation-evaluate for readmission to [place] unit once medically cleared Referring Physician:  Loney Hering, MD Patient Identification: Lisa Vasquez MRN:  [id number] Principal Diagnosis: <principal problem not specified> Diagnosis:  Active Problems:   Respiratory failure ([place])   Total Time spent with patient, reviewing chart, discussing plan of care with treatment team: 30 minutes  Subjective:   Lisa Vasquez is a 48 y.o. female patient transferred from behavioral health unit to medical floor due to hypoxia secondary to pneumonia (likely aspiration pneumonia s/p overdose).    Interval history: 07/06/2018-pt denies active SI, HI, AH, VH.  However, given the seriousness of her overdose/suicide attempt, pt will benefit from inpatient psychiatric admission.  Pt voices understanding.   Pt's BP is elevated.  Also, pt has area of induration on right forearm where there was an IV.  Pt complains of pain with palpation over area of induration.   Pt also complains of mild headache, which she attributes to alcohol withdrawal.  Pt denies shakes.  She was drinking in excess prior to admission, so Blood pressure elevation could be due to alcohol withdrawal as well.      Past Psychiatric History:  Short inpatient stay at [facility] (transferred to medical floor due to aspiration pneumonia/hypoxia).  Saw a psychiatrist at [facility] in [place] over 10 years ago.  Has been on  Zoloft for years prescribed by PCP.    Risk to Self:  yes; pt denies active SI, but had recent overdose Risk to Others:   pt denies HI Prior Inpatient Therapy:   pt denies Prior Outpatient Therapy:  yes  Past Medical History:  Past Medical History:  Diagnosis Date  . Anemia   . Hypertension   . Skin cancer     Past Surgical History:  Procedure Laterality Date  . CERVICAL ABLATION    . EYE SURGERY    . NASAL  SINUS SURGERY  2001  . STRABISMUS SURGERY Right   . WISDOM TOOTH EXTRACTION     Family History:  Family History  Problem Relation Age of Onset  . COPD Mother   . Skin cancer Mother   . Heart defect Mother   . Heart attack Maternal Aunt   . Heart attack Maternal Uncle   . Breast cancer Maternal Grandmother   . Melanoma Maternal Uncle    Family Psychiatric  History: pt states her 67 yo daughter has bipolar disorder and sees a Teacher, music at [employer].  Social History:  Social History   Substance and Sexual Activity  Alcohol Use Yes   Comment: "too much"     Social History   Substance and Sexual Activity  Drug Use Not Currently  . Types: Cocaine    Social History   Socioeconomic History  . Marital status: Married    Spouse name: Not on file  . Number of children: Not on file  . Years of education: Not on file  . Highest education level: Not on file  Occupational History  . Not on file  Social Needs  . Financial resource strain: Somewhat hard  . Food insecurity:    Worry: Often true    Inability: Often true  . Transportation needs:    Medical: Yes    Non-medical: Yes  Tobacco Use  . Smoking status: Former Research scientist (life sciences)  . Smokeless tobacco: Never Used  Substance and Sexual Activity  . Alcohol use: Yes    Comment: "  too much"  . Drug use: Not Currently    Types: Cocaine  . Sexual activity: Not on file  Lifestyle  . Physical activity:    Days per week: 7 days    Minutes per session: 20 min  . Stress: Very much  Relationships  . Social connections:    Talks on phone: Never    Gets together: Never    Attends religious service: Never    Active member of club or organization: Yes    Attends meetings of clubs or organizations: Never    Relationship status: Married  Other Topics Concern  . Not on file  Social History Narrative  . Not on file   Substance abuse history: The patient has been drinking 750 mL of wine per day for at least the past 15  years.  She denies history of cannabis, cocaine, opioid, stimulant use or any other illicit substances.  She quit smoking cigarettes in 2004.    Additional Social History: Per Dr. Waylan Boga note: "The patient was born at and raised in [place], [place] by her mother and stepfather as she did not know her biological father.  She did have a close relationship with her parents and denies any history of any physical or sexual abuse.  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  She has been married for over 2 years and has one 5 year old daughter.  She and her husband and daughter live in [place]."    Legal history: Pt is facing multiple pending charges for theft and fraud  Allergies:   Allergies  Allergen Reactions  . Ondansetron Other (See Comments)    Constipation     Labs:  Results for orders placed or performed during the hospital encounter of 07/05/18 (from the past 48 hour(s))  Ferritin     Status: None   Collection Time: 07/06/18  4:15 AM  Result Value Ref Range   Ferritin 165 11 - 307 ng/mL    Comment: Performed at [facility], [address] [place], [place] [address]  CBC     Status: Abnormal   Collection Time: 07/06/18  4:15 AM  Result Value Ref Range   WBC 10.1 4.0 - 10.5 K/uL   RBC 3.54 (L) 3.87 - 5.11 MIL/uL   Hemoglobin 8.6 (L) 12.0 - 15.0 g/dL   HCT 29.2 (L) 36.0 - 46.0 %   MCV 82.5 80.0 - 100.0 fL   MCH 24.3 (L) 26.0 - 34.0 pg   MCHC 29.5 (L) 30.0 - 36.0 g/dL   RDW 18.2 (H) 11.5 - 15.5 %   Platelets 240 150 - 400 K/uL   nRBC 0.8 (H) 0.0 - 0.2 %    Comment: Performed at [facility], [address], [place], [place] [address]    Current Facility-Administered Medications  Medication Dose Route Frequency Provider Last Rate Last Dose  . acetaminophen  (TYLENOL) tablet 650 mg  650 mg Oral Q6H PRN Salary, Montell D, MD      . albuterol (PROVENTIL) (2.5 MG/3ML) 0.083% nebulizer solution 2.5 mg  2.5 mg Nebulization Q2H PRN Salary, Montell D, MD      . alum & mag hydroxide-simeth (MAALOX/MYLANTA) 200-200-20 MG/5ML suspension 30 mL  30 mL Oral Q4H PRN Salary, Montell D, MD      . azithromycin (  ZITHROMAX) 500 mg in sodium chloride 0.9 % 250 mL IVPB  500 mg Intravenous Q24H Gorden Harms, MD   Stopped at 07/05/18 2150  . budesonide (PULMICORT) nebulizer solution 0.5 mg  0.5 mg Nebulization BID Salary, Montell D, MD   0.5 mg at 07/06/18 0800  . cefTRIAXone (ROCEPHIN) 2 g in sodium chloride 0.9 % 100 mL IVPB  2 g Intravenous Q24H Loletha Grayer, MD   Stopped at 07/05/18 1656  . cloNIDine (CATAPRES) tablet 0.1 mg  0.1 mg Oral BID PRN Salary, Montell D, MD      . ferrous sulfate tablet 325 mg  325 mg Oral BID WC Salary, Montell D, MD   325 mg at 07/06/18 0842  . fluticasone (FLONASE) 50 MCG/ACT nasal spray 2 spray  2 spray Each Nare Daily Salary, Montell D, MD   2 spray at 07/06/18 0842  . folic acid (FOLVITE) tablet 1 mg  1 mg Oral Daily Salary, Montell D, MD   1 mg at 07/06/18 1007  . hydrALAZINE (APRESOLINE) tablet 25 mg  25 mg Oral Q8H Salary, Montell D, MD   25 mg at 07/06/18 0843  . HYDROcodone-acetaminophen (NORCO/VICODIN) 5-325 MG per tablet 1-2 tablet  1-2 tablet Oral Q4H PRN Salary, Montell D, MD   1 tablet at 07/05/18 2342  . hydrOXYzine (ATARAX/VISTARIL) tablet 25 mg  25 mg Oral Q6H PRN Loney Hering D, MD   25 mg at 07/05/18 0957  . ipratropium-albuterol (DUONEB) 0.5-2.5 (3) MG/3ML nebulizer solution 3 mL  3 mL Nebulization QID Loletha Grayer, MD   3 mL at 07/06/18 1100  . lisinopril (PRINIVIL,ZESTRIL) tablet 20 mg  20 mg Oral Daily Salary, Montell D, MD   20 mg at 07/06/18 1007  . loperamide (IMODIUM) capsule 2-4 mg  2-4 mg Oral PRN Salary, Montell D, MD      . LORazepam (ATIVAN) tablet 1 mg  1 mg Oral Q6H PRN Salary, Avel Peace, MD        Or  . LORazepam (ATIVAN) injection 1 mg  1 mg Intravenous Q6H PRN Salary, Montell D, MD      . LORazepam (ATIVAN) tablet 1 mg  1 mg Oral Q6H PRN Salary, Montell D, MD      . lurasidone (LATUDA) tablet 40 mg  40 mg Oral Q breakfast Salary, Montell D, MD   40 mg at 07/06/18 0842  . magnesium hydroxide (MILK OF MAGNESIA) suspension 30 mL  30 mL Oral Daily PRN Salary, Montell D, MD      . methylPREDNISolone sodium succinate (SOLU-MEDROL) 40 mg/mL injection 40 mg  40 mg Intravenous Daily Loletha Grayer, MD   40 mg at 07/06/18 0841  . metoprolol succinate (TOPROL-XL) 24 hr tablet 50 mg  50 mg Oral Daily Salary, Montell D, MD   50 mg at 07/05/18 1151  . multivitamin with minerals tablet 1 tablet  1 tablet Oral Daily Salary, Montell D, MD   1 tablet at 07/06/18 1007  . pantoprazole (PROTONIX) EC tablet 40 mg  40 mg Oral BID AC Salary, Montell D, MD   40 mg at 07/06/18 0843  . promethazine (PHENERGAN) tablet 12.5 mg  12.5 mg Oral Q6H PRN Salary, Montell D, MD      . sertraline (ZOLOFT) tablet 100 mg  100 mg Oral Daily Salary, Montell D, MD   100 mg at 07/06/18 0843  . simvastatin (ZOCOR) tablet 40 mg  40 mg Oral Daily Salary, Montell D, MD   40 mg at 07/06/18 0842  .  thiamine (VITAMIN B-1) tablet 100 mg  100 mg Oral Daily Salary, Montell D, MD   100 mg at 07/06/18 0258   Or  . thiamine (B-1) injection 100 mg  100 mg Intravenous Daily Salary, Montell D, MD      . traZODone (DESYREL) tablet 100 mg  100 mg Oral QHS PRN Salary, Montell D, MD      . vitamin B-12 (CYANOCOBALAMIN) tablet 1,000 mcg  1,000 mcg Oral Daily Salary, Montell D, MD   1,000 mcg at 07/06/18 1007    Musculoskeletal: Strength & Muscle Tone: within normal limits Gait & Station: normal Patient leans: N/A  Psychiatric Specialty Exam: Physical Exam  Constitutional: She is oriented to person, place, and time. She appears well-developed and well-nourished. No distress.  HENT:  Head: Normocephalic and atraumatic.  Cardiovascular:  Normal rate and regular rhythm.  Respiratory: Effort normal. No respiratory distress.  GI: Bowel sounds are normal.  Musculoskeletal: Normal range of motion.  Neurological: She is oriented to person, place, and time.  Skin: Skin is warm and dry.  Psychiatric: Her speech is normal. Judgment normal. She is not agitated, not aggressive, not hyperactive, not actively hallucinating and not combative. Thought content is not paranoid. Cognition and memory are normal. She expresses no homicidal and no suicidal ideation.    Review of Systems  Constitutional: Negative for fever.  HENT:       Pt complains of headache   Eyes: Negative.   Respiratory: Positive for cough. Negative for shortness of breath (improving with treatment).   Cardiovascular: Negative.   Gastrointestinal: Negative.   Genitourinary: Negative.   Musculoskeletal: Negative.   Neurological: Positive for headaches.  Psychiatric/Behavioral: Negative for hallucinations, memory loss and suicidal ideas. The patient does not have insomnia.        Intermittent depression; feeling better since starting latuda Excessive alcohol use    Blood pressure (!) 165/107, pulse 74, temperature 97.7 F (36.5 C), temperature source Oral, resp. rate 20, SpO2 94 %.There is no height or weight on file to calculate BMI.  General Appearance: Casual and Well Groomed  Eye Contact:  Good  Speech:  Clear and Coherent and Normal Rate  Volume:  Normal  Mood:  "good"  Affect:  full range  Thought Process:  Coherent and Linear  Orientation:  Full (Time, Place, and Person)  Thought Content:  Logical  Suicidal Thoughts:  patient denies active SI  Homicidal Thoughts:  patient HI  Memory:  grossly intact  Judgement:  Intact  Insight:  Good  Psychomotor Activity:  Normal  Concentration:  Concentration: grossly intact and Attention Span: grossly intact  Recall:  intact  Fund of Knowledge:  Good  Language:  Good  Akathisia:  Negative  AIMS (if indicated):      Assets:  Communication Skills Desire for Improvement Housing Physical Health Resilience Social Support  ADL's:  Intact  Cognition:  WNL  Sleep:   improved per pt since starting Latuda     Treatment Plan Summary: Patient will need inpatient psychiatric admission secondary to intentional overdose/suicide attempt.  Discussed with primary team that patient's blood pressure is significantly elevated and pt has area of induration on right forearm where there was an IV.   Once blood pressure has improved and there is course of treatment for right forearm induration, then psychiatry will admit patient to [facility] for treatment of bipolar disorder and suicide attempt.  Of note, Pt also complains of mild headache, which she attributes to alcohol withdrawal.  Pt denies shakes. Denies history of alcohol withdrawal delirium tremens.  She was drinking in excess prior to admission, so Blood pressure elevation could be due to alcohol withdrawal as well.     Continue with sitter.   Continue latuda 40 mg qam breakfast. Continue zoloft 100 mg daily as well.  Discussed that given she is on a mood stabilizer, I will continue zoloft; however there is increase risk of mania with use of antidepressant medication in people with bipolar disorder.  Patient voices understanding.  Recommend close monitoring of patient for resurgence of manic symtpoms.  Discussed risks and benefits of sertraline and latuda with patient (including but not limited to risk of neuromalignant syndrome with antipsychotic medication).  Pt voices understanding and consents to the current treatment plan.  Continue alcohol withdrawal protocol.    Please contact Dr. Audie Box via Epic when patient is medically ready for transfer to [facility].   Disposition: Recommend psychiatric Inpatient admission when medically cleared. Supportive therapy provided about ongoing stressors.   Tennis Ship, MD 07/06/2018 11:01 AM

## 2018-07-07 LAB — BLOOD GAS, ARTERIAL
Acid-Base Excess: 1.4 mmol/L (ref 0.0–2.0)
Bicarbonate: 24.9 mmol/L (ref 20.0–28.0)
FIO2: 0.21
O2 Saturation: 59.8 %
Patient temperature: 37
pCO2 arterial: 35 mmHg (ref 32.0–48.0)
pH, Arterial: 7.46 — ABNORMAL HIGH (ref 7.350–7.450)

## 2018-07-07 MED ORDER — ENOXAPARIN SODIUM 40 MG/0.4ML ~~LOC~~ SOLN
40.0000 mg | SUBCUTANEOUS | Status: DC
  Administered 2018-07-07 – 2018-07-08 (×2): 40 mg via SUBCUTANEOUS
  Filled 2018-07-07 (×2): qty 0.4

## 2018-07-07 MED ORDER — POTASSIUM CHLORIDE CRYS ER 10 MEQ PO TBCR
10.0000 meq | EXTENDED_RELEASE_TABLET | Freq: Once | ORAL | Status: AC
  Administered 2018-07-07: 10 meq via ORAL
  Filled 2018-07-07: qty 1

## 2018-07-07 MED ORDER — ASPIRIN-ACETAMINOPHEN-CAFFEINE 250-250-65 MG PO TABS
2.0000 | ORAL_TABLET | Freq: Once | ORAL | Status: AC
  Administered 2018-07-07: 2 via ORAL
  Filled 2018-07-07: qty 2

## 2018-07-07 MED ORDER — AMLODIPINE BESYLATE 10 MG PO TABS
10.0000 mg | ORAL_TABLET | Freq: Every day | ORAL | Status: DC
  Administered 2018-07-07: 10 mg via ORAL
  Filled 2018-07-07: qty 1

## 2018-07-07 MED ORDER — HYDRALAZINE HCL 20 MG/ML IJ SOLN
10.0000 mg | INTRAMUSCULAR | Status: DC | PRN
  Administered 2018-07-07 – 2018-07-08 (×2): 10 mg via INTRAVENOUS
  Filled 2018-07-07 (×2): qty 1

## 2018-07-07 MED ORDER — VITAMIN B-12 1000 MCG PO TABS
1000.0000 ug | ORAL_TABLET | Freq: Every day | ORAL | Status: DC
  Administered 2018-07-07 – 2018-07-09 (×3): 1000 ug via ORAL
  Filled 2018-07-07 (×3): qty 1

## 2018-07-07 MED ORDER — IPRATROPIUM-ALBUTEROL 0.5-2.5 (3) MG/3ML IN SOLN
3.0000 mL | Freq: Two times a day (BID) | RESPIRATORY_TRACT | Status: DC
  Administered 2018-07-07 – 2018-07-08 (×2): 3 mL via RESPIRATORY_TRACT
  Filled 2018-07-07 (×2): qty 3

## 2018-07-07 NOTE — Progress Notes (Signed)
[facility] [place] NAME: Lisa Vasquez    MR#:  [id number]  DATE OF BIRTH:  01-Apr-1971  SUBJECTIVE:  CHIEF COMPLAINT:  No chief complaint on file. Patient without complaint, no events overnight, patient continues to have uncontrolled blood pressure, psychiatry input greatly appreciated, okay to discontinue telemetry  REVIEW OF SYSTEMS:  CONSTITUTIONAL: No fever, fatigue or weakness.  EYES: No blurred or double vision.  EARS, NOSE, AND THROAT: No tinnitus or ear pain.  RESPIRATORY: No cough, shortness of breath, wheezing or hemoptysis.  CARDIOVASCULAR: No chest pain, orthopnea, edema.  GASTROINTESTINAL: No nausea, vomiting, diarrhea or abdominal pain.  GENITOURINARY: No dysuria, hematuria.  ENDOCRINE: No polyuria, nocturia,  HEMATOLOGY: No anemia, easy bruising or bleeding SKIN: No rash or lesion. MUSCULOSKELETAL: No joint pain or arthritis.   NEUROLOGIC: No tingling, numbness, weakness.  PSYCHIATRY: No anxiety or depression.   ROS  DRUG ALLERGIES:   Allergies  Allergen Reactions  . Ondansetron Other (See Comments)    Constipation     VITALS:  Blood pressure (!) 148/98, pulse 73, temperature 97.8 F (36.6 C), temperature source Oral, resp. rate 18, SpO2 96 %.  PHYSICAL EXAMINATION:  GENERAL:  48 y.o.-year-old patient lying in the bed with no acute distress.  EYES: Pupils equal, round, reactive to light and accommodation. No scleral icterus. Extraocular muscles intact.  HEENT: Head atraumatic, normocephalic. Oropharynx and nasopharynx clear.  NECK:  Supple, no jugular venous distention. No thyroid enlargement, no tenderness.  LUNGS: Normal breath sounds bilaterally, no wheezing, rales,rhonchi or crepitation. No use of accessory muscles of respiration.  CARDIOVASCULAR: S1, S2 normal. No murmurs, rubs, or gallops.  ABDOMEN: Soft, nontender, nondistended. Bowel sounds present. No organomegaly or mass.  EXTREMITIES: No pedal  edema, cyanosis, or clubbing.  NEUROLOGIC: Cranial nerves II through XII are intact. Muscle strength 5/5 in all extremities. Sensation intact. Gait not checked.  PSYCHIATRIC: The patient is alert and oriented x 3.  SKIN: No obvious rash, lesion, or ulcer.   Physical Exam LABORATORY PANEL:   CBC Recent Labs  Lab 07/06/18 0415  WBC 10.1  HGB 8.6*  HCT 29.2*  PLT 240   ------------------------------------------------------------------------------------------------------------------  Chemistries  Recent Labs  Lab 07/01/18 0539  07/04/18 2032  NA  --    < > 136  K  --    < > 3.4*  CL  --    < > 104  CO2  --    < > 24  GLUCOSE  --    < > 100*  BUN  --    < > 9  CREATININE  --    < > 0.78  CALCIUM  --    < > 8.8*  MG 1.7  --   --   AST  --   --  22  ALT  --   --  18  ALKPHOS  --   --  53  BILITOT  --   --  0.3   < > = values in this interval not displayed.   ------------------------------------------------------------------------------------------------------------------  Cardiac Enzymes No results for input(s): TROPONINI in the last 168 hours. ------------------------------------------------------------------------------------------------------------------  RADIOLOGY:  No results found.  ASSESSMENT AND PLAN:  *Acute hypoxic respiratory failure Resolved Patient sent up from psychiatric unit for desaturation down to 84%, successfully weaned off oxygen, after ambulation-O2 saturation 95% on room air  *Acute aspiration pneumonitis Resolving Continue prednisone taper, empiric Rocephin, and continue close medical monitoring   *Acute arm cellulitis  Resolving Continue  empiric Rocephin  *Chronic hypertension Uncontrolled Add Norvasc to current regiment, IV hydralazine PRN, vitals per routine, and make changes as per necessary Hopefully should be able to be discharged tomorrow to inpatient psych if blood pressures controlled  *Chronic Depression and bipolar disorder   Psychiatry input greatly appreciated  Continue current psychotropic regiment, for discharge to inpatient psychiatry facility hopefully on tomorrow barring any complications   *Chronic obesity  Most likely secondary to excess calories  Lifestyle modification recommended    Disposition to inpatient psychiatric facility on tomorrow if blood pressure is controlled and patient continues to do well   All the records are reviewed and case discussed with Care Management/Social Workerr. Management plans discussed with the patient, family and they are in agreement.  CODE STATUS: full  TOTAL TIME TAKING CARE OF THIS PATIENT: 35 minutes.     POSSIBLE D/C IN 1-2 DAYS, DEPENDING ON CLINICAL CONDITION.   Avel Peace Mana Haberl M.D on 07/07/2018   Between 7am to 6pm - Pager - [phone]  After 6pm go to [email or web address] - password [email or web address] [facility]  Office  [phone]  CC: Primary care physician; Ronnell Freshwater, PA-C  Note: This dictation was prepared with Dragon dictation along with smaller phrase technology. Any transcriptional errors that result from this process are unintentional.

## 2018-07-07 NOTE — Consult Note (Signed)
[facility] Face-to-Face Psychiatry Consult Follow up  Reason for Consult:  Psychiatric Evaluation-evaluate for readmission to [facility] unit once medically cleared Referring Physician:  Loney Hering, MD Patient Identification: Lisa Vasquez MRN:  [id number] Principal Diagnosis: <principal problem not specified> Diagnosis:  Active Problems:   Respiratory failure ([place])   Total Time spent with patient, reviewing chart, discussing plan of care with treatment team: 15 minutes  Subjective:   Lisa Vasquez is a 48 y.o. female patient transferred from behavioral health unit to medical floor due to hypoxia secondary to pneumonia (likely aspiration pneumonia s/p overdose).    Interval history: 07/06/2018-pt denies active SI, HI, AH, VH.  However, given the seriousness of her overdose/suicide attempt, pt will benefit from inpatient psychiatric admission.  Pt voices understanding.   Pt's BP is elevated.  Also, pt has area of induration on right forearm where there was an IV.  Pt complains of pain with palpation over area of induration.   Pt also complains of mild headache, which she attributes to alcohol withdrawal.  Pt denies shakes.  She was drinking in excess prior to admission, so Blood pressure elevation could be due to alcohol withdrawal as well.    07/07/2018 Pt reports she's doing well today. She's anxious to get down to behavioral health, b/c the faster she can get down there, the faster she can be discharged home to see her family and dog.   Pt states overall that the latuda and zoloft combination seems to be working well for her mood stabilization.  Her BP has been elevated so her primary team is working on getting it down.  Pt denies SI, HI, AH, VH.     Past Psychiatric History:  Short inpatient stay at [facility] (transferred to medical floor due to aspiration pneumonia/hypoxia).  Saw a psychiatrist at [facility] in [place] over 10 years ago.  Has been on  Zoloft for  years prescribed by PCP.    Risk to Self:  yes; pt denies active SI, but had recent overdose Risk to Others:   pt denies HI Prior Inpatient Therapy:   pt denies Prior Outpatient Therapy:  yes  Past Medical History:  Past Medical History:  Diagnosis Date  . Anemia   . Hypertension   . Skin cancer     Past Surgical History:  Procedure Laterality Date  . CERVICAL ABLATION    . EYE SURGERY    . NASAL SINUS SURGERY  2001  . STRABISMUS SURGERY Right   . WISDOM TOOTH EXTRACTION     Family History:  Family History  Problem Relation Age of Onset  . COPD Mother   . Skin cancer Mother   . Heart defect Mother   . Heart attack Maternal Aunt   . Heart attack Maternal Uncle   . Breast cancer Maternal Grandmother   . Melanoma Maternal Uncle    Family Psychiatric  History: pt states her 53 yo daughter has bipolar disorder and sees a Teacher, music at [employer].  Social History:  Social History   Substance and Sexual Activity  Alcohol Use Yes   Comment: "too much"     Social History   Substance and Sexual Activity  Drug Use Not Currently  . Types: Cocaine    Social History   Socioeconomic History  . Marital status: Married    Spouse name: Not on file  . Number of children: Not on file  . Years of education: Not on file  . Highest education  level: Not on file  Occupational History  . Not on file  Social Needs  . Financial resource strain: Somewhat hard  . Food insecurity:    Worry: Often true    Inability: Often true  . Transportation needs:    Medical: Yes    Non-medical: Yes  Tobacco Use  . Smoking status: Former Research scientist (life sciences)  . Smokeless tobacco: Never Used  Substance and Sexual Activity  . Alcohol use: Yes    Comment: "too much"  . Drug use: Not Currently    Types: Cocaine  . Sexual activity: Not on file  Lifestyle  . Physical activity:    Days per week: 7 days    Minutes per session: 20 min  . Stress: Very much  Relationships  . Social  connections:    Talks on phone: Never    Gets together: Never    Attends religious service: Never    Active member of club or organization: Yes    Attends meetings of clubs or organizations: Never    Relationship status: Married  Other Topics Concern  . Not on file  Social History Narrative  . Not on file   Substance abuse history: The patient has been drinking 750 mL of wine per day for at least the past 15 years.  She denies history of cannabis, cocaine, opioid, stimulant use or any other illicit substances.  She quit smoking cigarettes in 2004.    Additional Social History: Per Dr. Waylan Boga note: "The patient was born at and raised in [place], [place] by her mother and stepfather as she did not know her biological father.  She did have a close relationship with her parents and denies any history of any physical or sexual abuse.  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  She has been married for over 43 years and has one 82 year old daughter.  She and her husband and daughter live in [place]."    Legal history: Pt is facing multiple pending charges for theft and fraud  Allergies:   Allergies  Allergen Reactions  . Ondansetron Other (See Comments)    Constipation     Labs:  Results for orders placed or performed during the hospital encounter of 07/05/18 (from the past 48 hour(s))  Ferritin     Status: None   Collection Time: 07/06/18  4:15 AM  Result Value Ref Range   Ferritin 165 11 - 307 ng/mL    Comment: Performed at [facility], [address] [place], [place] [address]  CBC     Status: Abnormal   Collection Time: 07/06/18  4:15 AM  Result Value Ref Range   WBC 10.1 4.0 - 10.5 K/uL   RBC 3.54 (L) 3.87 - 5.11 MIL/uL   Hemoglobin 8.6 (L) 12.0 - 15.0 g/dL   HCT  29.2 (L) 36.0 - 46.0 %   MCV 82.5 80.0 - 100.0 fL   MCH 24.3 (L) 26.0 - 34.0 pg   MCHC 29.5 (L) 30.0 - 36.0 g/dL   RDW 18.2 (H) 11.5 - 15.5 %   Platelets 240 150 - 400 K/uL   nRBC 0.8 (H) 0.0 - 0.2 %    Comment: Performed at [facility], [address], [place], [place] [address]    Current Facility-Administered Medications  Medication Dose Route  Frequency Provider Last Rate Last Dose  . acetaminophen (TYLENOL) tablet 650 mg  650 mg Oral Q6H PRN Salary, Montell D, MD      . albuterol (PROVENTIL) (2.5 MG/3ML) 0.083% nebulizer solution 2.5 mg  2.5 mg Nebulization Q2H PRN Salary, Montell D, MD      . alum & mag hydroxide-simeth (MAALOX/MYLANTA) 200-200-20 MG/5ML suspension 30 mL  30 mL Oral Q4H PRN Salary, Montell D, MD      . amLODipine (NORVASC) tablet 10 mg  10 mg Oral Daily Salary, Montell D, MD   10 mg at 07/07/18 0913  . aspirin-acetaminophen-caffeine (EXCEDRIN MIGRAINE) per tablet 2 tablet  2 tablet Oral Once Salary, Montell D, MD      . budesonide (PULMICORT) nebulizer solution 0.5 mg  0.5 mg Nebulization BID Salary, Montell D, MD   0.5 mg at 07/07/18 0757  . cefTRIAXone (ROCEPHIN) 2 g in sodium chloride 0.9 % 100 mL IVPB  2 g Intravenous Q24H Loletha Grayer, MD 200 mL/hr at 07/06/18 1719 2 g at 07/06/18 1719  . cloNIDine (CATAPRES) tablet 0.1 mg  0.1 mg Oral BID PRN Loney Hering D, MD   0.1 mg at 07/06/18 2326  . doxycycline (VIBRA-TABS) tablet 100 mg  100 mg Oral Q12H Loletha Grayer, MD   100 mg at 07/07/18 0912  . enoxaparin (LOVENOX) injection 40 mg  40 mg Subcutaneous Q24H Salary, Montell D, MD      . ferrous sulfate tablet 325 mg  325 mg Oral BID WC Salary, Montell D, MD   325 mg at 07/07/18 0804  . fluticasone (FLONASE) 50 MCG/ACT nasal spray 2 spray  2 spray Each Nare Daily Salary, Montell D, MD   2 spray at 07/06/18 0842  . folic acid (FOLVITE) tablet 1 mg  1 mg Oral Daily Salary, Montell D, MD   1 mg at 07/07/18 0913  . hydrALAZINE (APRESOLINE) injection 10  mg  10 mg Intravenous Q4H PRN Salary, Montell D, MD   10 mg at 07/07/18 0804  . hydrALAZINE (APRESOLINE) tablet 50 mg  50 mg Oral TID Loletha Grayer, MD   50 mg at 07/07/18 0913  . HYDROcodone-acetaminophen (NORCO/VICODIN) 5-325 MG per tablet 1-2 tablet  1-2 tablet Oral Q4H PRN Salary, Avel Peace, MD   1 tablet at 07/06/18 2250  . ipratropium-albuterol (DUONEB) 0.5-2.5 (3) MG/3ML nebulizer solution 3 mL  3 mL Nebulization BID Salary, Montell D, MD      . lisinopril (PRINIVIL,ZESTRIL) tablet 20 mg  20 mg Oral BID Loletha Grayer, MD   20 mg at 07/07/18 0913  . LORazepam (ATIVAN) tablet 1 mg  1 mg Oral Q6H PRN Salary, Avel Peace, MD       Or  . LORazepam (ATIVAN) injection 1 mg  1 mg Intravenous Q6H PRN Salary, Montell D, MD      . lurasidone (LATUDA) tablet 40 mg  40 mg Oral Q breakfast Salary, Montell D, MD   40 mg at 07/07/18 1117  . magnesium hydroxide (MILK OF MAGNESIA) suspension 30 mL  30 mL Oral Daily PRN Salary, Montell D, MD      . metoprolol succinate (TOPROL-XL) 24 hr tablet 50 mg  50 mg Oral Daily Salary, Montell D, MD   50 mg at 07/07/18 0912  . multivitamin with minerals tablet 1 tablet  1 tablet Oral Daily Salary, Avel Peace, MD   1 tablet at 07/07/18 0912  . pantoprazole (PROTONIX) EC tablet 40 mg  40 mg Oral BID AC Salary, Montell D,  MD   40 mg at 07/07/18 0803  . potassium chloride (K-DUR,KLOR-CON) CR tablet 10 mEq  10 mEq Oral Once Salary, Montell D, MD      . predniSONE (DELTASONE) tablet 40 mg  40 mg Oral Q breakfast Loletha Grayer, MD   40 mg at 07/07/18 0803  . promethazine (PHENERGAN) tablet 12.5 mg  12.5 mg Oral Q6H PRN Salary, Montell D, MD      . sertraline (ZOLOFT) tablet 100 mg  100 mg Oral Daily Salary, Montell D, MD   100 mg at 07/07/18 0912  . simvastatin (ZOCOR) tablet 40 mg  40 mg Oral Daily Salary, Montell D, MD   40 mg at 07/07/18 0912  . thiamine (VITAMIN B-1) tablet 100 mg  100 mg Oral Daily Salary, Montell D, MD   100 mg at 07/07/18 4098   Or  . thiamine  (B-1) injection 100 mg  100 mg Intravenous Daily Salary, Montell D, MD      . traZODone (DESYREL) tablet 100 mg  100 mg Oral QHS PRN Salary, Montell D, MD      . vitamin B-12 (CYANOCOBALAMIN) tablet 1,000 mcg  1,000 mcg Oral Daily Salary, Montell D, MD   1,000 mcg at 07/07/18 1048    Musculoskeletal: Strength & Muscle Tone: within normal limits Gait & Station: normal Patient leans: N/A  Psychiatric Specialty Exam: Physical Exam  Constitutional: She is oriented to person, place, and time. She appears well-developed and well-nourished. No distress.  HENT:  Head: Normocephalic and atraumatic.  Cardiovascular: Normal rate and regular rhythm.  Respiratory: Effort normal. No respiratory distress.  GI: Bowel sounds are normal.  Musculoskeletal: Normal range of motion.  Neurological: She is oriented to person, place, and time.  Skin: Skin is warm and dry.  Psychiatric: Her speech is normal. Judgment normal. She is not agitated, not aggressive, not hyperactive, not actively hallucinating and not combative. Thought content is not paranoid. Cognition and memory are normal. She expresses no homicidal and no suicidal ideation.      Review of Systems  Constitutional: Negative for fever.  HENT:       Pt complains of headache   Eyes: Negative.   Respiratory: Positive for cough. Negative for shortness of breath (improving with treatment).   Cardiovascular: Negative.   Gastrointestinal: Negative.   Genitourinary: Negative.   Musculoskeletal: Negative.   Psychiatric/Behavioral: Negative for depression, hallucinations, memory loss and suicidal ideas. The patient does not have insomnia.     Blood pressure (!) 156/99, pulse 80, temperature 97.8 F (36.6 C), temperature source Oral, resp. rate 18, SpO2 94 %.There is no height or weight on file to calculate BMI.  General Appearance: Casual and Well Groomed  Eye Contact:  Good  Speech:  Clear and Coherent and Normal Rate  Volume:  Normal  Mood:   "good"  Affect:  full range  Thought Process:  Coherent and Linear  Orientation:  Full (Time, Place, and Person)  Thought Content:  Logical  Suicidal Thoughts:  patient denies active SI  Homicidal Thoughts:  pt denies HI  Memory:  grossly intact  Judgement:  Intact  Insight:  Good  Psychomotor Activity:  Normal  Concentration:  Concentration: grossly intact and Attention Span: grossly intact  Recall:  intact  Fund of Knowledge:  Good  Language:  Good  Akathisia:  Negative  AIMS (if indicated):     Assets:  Communication Skills Desire for Improvement Housing Physical Health Resilience Social Support  ADL's:  Intact  Cognition:  WNL  Sleep:   improved per pt since starting [facility] Summary: Patient will need inpatient psychiatric admission secondary to intentional overdose/suicide attempt.  Discussed with primary team that patient's blood pressure is significantly elevated and pt has area of induration on right forearm where there was an IV.   Once blood pressure has improved and there is course of treatment for right forearm induration, then psychiatry will admit patient to [facility] for treatment of bipolar disorder and suicide attempt.  Of note, Pt also complains of mild headache, which she attributes to alcohol withdrawal.  Pt denies shakes. Denies history of alcohol withdrawal delirium tremens.  She was drinking in excess prior to admission, so Blood pressure elevation could be due to alcohol withdrawal as well.     Continue with sitter.   Continue latuda 40 mg qam breakfast. Continue zoloft 100 mg daily as well.  Discussed that given she is on a mood stabilizer, I will continue zoloft; however there is increase risk of mania with use of antidepressant medication in people with bipolar disorder.  Patient voices understanding.  Recommend close monitoring of patient for resurgence of manic symtpoms.  Discussed risks and benefits of sertraline and  latuda with patient (including but not limited to risk of neuromalignant syndrome with antipsychotic medication).  Pt voices understanding and consents to the current treatment plan.  Continue alcohol withdrawal protocol.    Please contact Dr. Audie Box via Epic when patient is medically ready for transfer to [facility].   Disposition: Recommend psychiatric Inpatient admission when medically cleared. Supportive therapy provided about ongoing stressors.   Tennis Ship, MD 07/07/2018 11:44 AM

## 2018-07-08 MED ORDER — CLONIDINE HCL 0.1 MG PO TABS
0.1000 mg | ORAL_TABLET | Freq: Three times a day (TID) | ORAL | Status: DC
  Administered 2018-07-08 – 2018-07-09 (×3): 0.1 mg via ORAL
  Filled 2018-07-08 (×3): qty 1

## 2018-07-08 MED ORDER — CLONIDINE HCL 0.1 MG PO TABS
0.1000 mg | ORAL_TABLET | Freq: Two times a day (BID) | ORAL | Status: DC
  Administered 2018-07-08: 0.1 mg via ORAL
  Filled 2018-07-08: qty 1

## 2018-07-08 MED ORDER — AMLODIPINE BESYLATE 10 MG PO TABS
10.0000 mg | ORAL_TABLET | Freq: Every day | ORAL | Status: DC
  Administered 2018-07-08: 10 mg via ORAL
  Filled 2018-07-08: qty 1

## 2018-07-08 MED ORDER — CLONAZEPAM 0.5 MG PO TABS
0.5000 mg | ORAL_TABLET | Freq: Two times a day (BID) | ORAL | Status: DC | PRN
  Administered 2018-07-08: 0.5 mg via ORAL
  Filled 2018-07-08: qty 1

## 2018-07-08 NOTE — Progress Notes (Addendum)
[facility] [place] NAME: Tishana Clinkenbeard    MR#:  [id number]  DATE OF BIRTH:  [birth year]  SUBJECTIVE:  CHIEF COMPLAINT:  No chief complaint on file. Patient without complaint, no events overnight, patient continues to have uncontrolled blood pressure-we will start clonidine 3 times daily, change Norvasc to at bedtime, psychiatry input greatly appreciated  REVIEW OF SYSTEMS:  CONSTITUTIONAL: No fever, fatigue or weakness.  EYES: No blurred or double vision.  EARS, NOSE, AND THROAT: No tinnitus or ear pain.  RESPIRATORY: No cough, shortness of breath, wheezing or hemoptysis.  CARDIOVASCULAR: No chest pain, orthopnea, edema.  GASTROINTESTINAL: No nausea, vomiting, diarrhea or abdominal pain.  GENITOURINARY: No dysuria, hematuria.  ENDOCRINE: No polyuria, nocturia,  HEMATOLOGY: No anemia, easy bruising or bleeding SKIN: No rash or lesion. MUSCULOSKELETAL: No joint pain or arthritis.   NEUROLOGIC: No tingling, numbness, weakness.  PSYCHIATRY: No anxiety or depression.   ROS  DRUG ALLERGIES:   Allergies  Allergen Reactions  . Ondansetron Other (See Comments)    Constipation     VITALS:  Blood pressure (!) 166/108, pulse 74, temperature 97.8 F (36.6 C), temperature source Oral, resp. rate 18, SpO2 97 %.  PHYSICAL EXAMINATION:  GENERAL:  48 y.o.-year-old patient lying in the bed with no acute distress.  EYES: Pupils equal, round, reactive to light and accommodation. No scleral icterus. Extraocular muscles intact.  HEENT: Head atraumatic, normocephalic. Oropharynx and nasopharynx clear.  NECK:  Supple, no jugular venous distention. No thyroid enlargement, no tenderness.  LUNGS: Normal breath sounds bilaterally, no wheezing, rales,rhonchi or crepitation. No use of accessory muscles of respiration.  CARDIOVASCULAR: S1, S2 normal. No murmurs, rubs, or gallops.  ABDOMEN: Soft, nontender, nondistended. Bowel sounds present. No organomegaly  or mass.  EXTREMITIES: No pedal edema, cyanosis, or clubbing.  NEUROLOGIC: Cranial nerves II through XII are intact. Muscle strength 5/5 in all extremities. Sensation intact. Gait not checked.  PSYCHIATRIC: The patient is alert and oriented x 3.  SKIN: No obvious rash, lesion, or ulcer.   Physical Exam LABORATORY PANEL:   CBC Recent Labs  Lab 07/06/18 0415  WBC 10.1  HGB 8.6*  HCT 29.2*  PLT 240   ------------------------------------------------------------------------------------------------------------------  Chemistries  Recent Labs  Lab 07/04/18 2032  NA 136  K 3.4*  CL 104  CO2 24  GLUCOSE 100*  BUN 9  CREATININE 0.78  CALCIUM 8.8*  AST 22  ALT 18  ALKPHOS 53  BILITOT 0.3   ------------------------------------------------------------------------------------------------------------------  Cardiac Enzymes No results for input(s): TROPONINI in the last 168 hours. ------------------------------------------------------------------------------------------------------------------  RADIOLOGY:  No results found.  ASSESSMENT AND PLAN:  *Acute hypoxic respiratory failure Resolved Patient sent up from psychiatric unit for desaturation down to 84%, successfully weaned off oxygen, after ambulation-O2 saturation 95% on room air  *Acute aspiration pneumonitis Resolving Continue prednisone taper, empiric Rocephin, and continue close medical monitoring   *Acute arm cellulitis  Resolving Continue empiric Rocephin  *Chronic hypertension No improvement  Add clonidine 3 times daily, Norvasc at bedtime, IV hydralazine PRN, vitals per routine, and make changes as per necessary Hopefully should be able to be discharged tomorrow to inpatient psych if blood pressures controlled  *Chronic Depression and bipolar disorder  Psychiatry input greatly appreciated  Continue current psychotropic regiment, for discharge to inpatient psychiatry facility hopefully on tomorrow barring  any complications   *Chronic obesity  Most likely secondary to excess calories  Lifestyle modification recommended    Disposition to inpatient psychiatric facility on  tomorrow if blood pressure is controlled and patient continues to do well   All the records are reviewed and case discussed with Care Management/Social Workerr. Management plans discussed with the patient, family and they are in agreement.  CODE STATUS: full  TOTAL TIME TAKING CARE OF THIS PATIENT: 35 minutes  POSSIBLE D/C IN 1 DAYS, DEPENDING ON CLINICAL CONDITION.   Avel Peace Salary M.D on 07/08/2018   Between 7am to 6pm - Pager - [phone]  After 6pm go to [email or web address] - password [email or web address] [facility]  Office  [phone]  CC: Primary care physician; Ronnell Freshwater, PA-C  Note: This dictation was prepared with Dragon dictation along with smaller phrase technology. Any transcriptional errors that result from this process are unintentional.

## 2018-07-09 DIAGNOSIS — F1020 Alcohol dependence, uncomplicated: Secondary | ICD-10-CM

## 2018-07-09 LAB — CBC
HCT: 31.7 % — ABNORMAL LOW (ref 36.0–46.0)
Hemoglobin: 9.4 g/dL — ABNORMAL LOW (ref 12.0–15.0)
MCH: 24 pg — ABNORMAL LOW (ref 26.0–34.0)
MCHC: 29.7 g/dL — ABNORMAL LOW (ref 30.0–36.0)
MCV: 80.9 fL (ref 80.0–100.0)
Platelets: 309 10*3/uL (ref 150–400)
RBC: 3.92 MIL/uL (ref 3.87–5.11)
RDW: 18.7 % — ABNORMAL HIGH (ref 11.5–15.5)
WBC: 9.6 10*3/uL (ref 4.0–10.5)
nRBC: 0.3 % — ABNORMAL HIGH (ref 0.0–0.2)

## 2018-07-09 LAB — CREATININE, SERUM
Creatinine, Ser: 0.72 mg/dL (ref 0.44–1.00)
GFR calc Af Amer: >60 mL/min (ref >60)
GFR calc non Af Amer: >60 mL/min (ref >60)

## 2018-07-09 MED ORDER — SERTRALINE HCL 100 MG PO TABS: 100.0000 mg | ORAL_TABLET | Freq: Every day | ORAL | 0 refills | Status: DC

## 2018-07-09 MED ORDER — ADULT MULTIVITAMIN W/MINERALS CH: 1.0000 | ORAL_TABLET | Freq: Every day | ORAL | 0 refills | Status: DC

## 2018-07-09 MED ORDER — FERROUS SULFATE 325 (65 FE) MG PO TABS: 325.0000 mg | ORAL_TABLET | Freq: Two times a day (BID) | ORAL | 0 refills | Status: DC

## 2018-07-09 MED ORDER — AMLODIPINE BESYLATE 10 MG PO TABS: 10.0000 mg | ORAL_TABLET | Freq: Every day | ORAL | 0 refills | Status: DC

## 2018-07-09 MED ORDER — CEPHALEXIN 500 MG PO CAPS: 500.0000 mg | ORAL_CAPSULE | Freq: Three times a day (TID) | ORAL | 0 refills | Status: AC

## 2018-07-09 MED ORDER — HYDRALAZINE HCL 50 MG PO TABS: 50.0000 mg | ORAL_TABLET | Freq: Three times a day (TID) | ORAL | 0 refills | Status: DC

## 2018-07-09 MED ORDER — LURASIDONE HCL 40 MG PO TABS: 40.0000 mg | ORAL_TABLET | Freq: Every day | ORAL | 0 refills | Status: DC

## 2018-07-09 MED ORDER — CLONIDINE HCL 0.1 MG PO TABS: 0.1000 mg | ORAL_TABLET | Freq: Three times a day (TID) | ORAL | 0 refills | Status: DC

## 2018-07-09 NOTE — Discharge Summary (Addendum)
[facility] NAME: Lisa Vasquez    MR#:  [id number]  DATE OF BIRTH:  08/17/1970  DATE OF ADMISSION:  07/05/2018 ADMITTING PHYSICIAN: Gorden Harms, MD  DATE OF DISCHARGE: No discharge date for patient encounter.  PRIMARY CARE PHYSICIAN: Leafy Kindle, Pasup Hita, PA-C    ADMISSION DIAGNOSIS:  mild hypoxia respiratory failure  DISCHARGE DIAGNOSIS:  Active Problems:   Respiratory failure ([place])   SECONDARY DIAGNOSIS:   Past Medical History:  Diagnosis Date  . Anemia   . Hypertension   . Skin cancer     HOSPITAL COURSE:  *Acute hypoxic respiratory failure Resolved Patient sent up from psychiatric unit for desaturation down to 84%, successfully weaned off oxygen, after ambulation-O2 saturation 95% on room air  *Acute aspiration pneumonitis Resolved Treated with prednisone taper, empiric Rocephin, and patient did well    *Acute arm cellulitis  Resolving Treated with empiric Rocephin while in house, will continue Keflex to complete antibiotic course   *Chronic hypertension Controlled on current regiment  *Chronic Depression and bipolar disorder  Stable Psychiatry consulted while in house, per psychiatry/Dr. Davina Poke whom saw patient on day of discharge-okay for discharge to home with outpatient follow-up   *Chronic obesity  Stable Most likely secondary to excess calories  Lifestyle modification recommended   DISCHARGE CONDITIONS:   stable  CONSULTS OBTAINED:  Treatment Team:  Chauncey Mann, MD Tennis Ship, MD  DRUG ALLERGIES:   Allergies  Allergen Reactions  . Ondansetron Other (See Comments)    Constipation     DISCHARGE MEDICATIONS:   Allergies as of 07/09/2018      Reactions   Ondansetron Other (See Comments)   Constipation      Medication List    TAKE these medications   albuterol 108 (90 Base) MCG/ACT inhaler Commonly known as:  PROVENTIL HFA;VENTOLIN HFA Inhale 2 puffs  into the lungs every 6 (six) hours as needed for wheezing or shortness of breath.   amLODipine 10 MG tablet Commonly known as:  NORVASC Take 1 tablet (10 mg total) by mouth at bedtime.   cephALEXin 500 MG capsule Commonly known as:  KEFLEX Take 1 capsule (500 mg total) by mouth 3 (three) times daily for 10 days.   cloNIDine 0.1 MG tablet Commonly known as:  CATAPRES Take 1 tablet (0.1 mg total) by mouth 3 (three) times daily.   ferrous sulfate 325 (65 FE) MG tablet Take 1 tablet (325 mg total) by mouth 2 (two) times daily with a meal.   fluticasone 50 MCG/ACT nasal spray Commonly known as:  FLONASE PLACE 1 SPRAY IN EACH NOSTRIL D   hydrALAZINE 50 MG tablet Commonly known as:  APRESOLINE Take 1 tablet (50 mg total) by mouth 3 (three) times daily.   lisinopril 20 MG tablet Commonly known as:  PRINIVIL,ZESTRIL Take 1 tablet (20 mg total) by mouth daily.   lurasidone 40 MG Tabs tablet Commonly known as:  LATUDA Take 1 tablet (40 mg total) by mouth daily with breakfast.   metoprolol succinate 50 MG 24 hr tablet Commonly known as:  TOPROL-XL Take 1 tablet (50 mg total) by mouth daily. Take with or immediately following a meal.   multivitamin with minerals Tabs tablet Take 1 tablet by mouth daily.   omeprazole 40 MG capsule Commonly known as:  PRILOSEC Take 40 mg by mouth 2 (two) times daily.   sertraline 100 MG tablet Commonly known as:  ZOLOFT Take 1 tablet (100 mg total)  by mouth daily.   simvastatin 40 MG tablet Commonly known as:  ZOCOR Take 40 mg by mouth daily.        DISCHARGE INSTRUCTIONS:    If you experience worsening of your admission symptoms, develop shortness of breath, life threatening emergency, suicidal or homicidal thoughts you must seek medical attention immediately by calling 911 or calling your MD immediately  if symptoms less severe.  You Must read complete instructions/literature along with all the possible adverse reactions/side effects  for all the Medicines you take and that have been prescribed to you. Take any new Medicines after you have completely understood and accept all the possible adverse reactions/side effects.   Please note  You were cared for by a hospitalist during your hospital stay. If you have any questions about your discharge medications or the care you received while you were in the hospital after you are discharged, you can call the unit and asked to speak with the hospitalist on call if the hospitalist that took care of you is not available. Once you are discharged, your primary care physician will handle any further medical issues. Please note that NO REFILLS for any discharge medications will be authorized once you are discharged, as it is imperative that you return to your primary care physician (or establish a relationship with a primary care physician if you do not have one) for your aftercare needs so that they can reassess your need for medications and monitor your lab values.    Today   CHIEF COMPLAINT:  No chief complaint on file.   HISTORY OF PRESENT ILLNESS:   48 y.o.femalewith a known history per below, recent hospital discharge to inpatient psychiatry for acute drug overdose with associated aspiration pneumonia, hospitalist called for acute hypoxia with O2 saturation in the 80s in the inpatient psychiatric facility, patient currently in no apparent distress, resting comfortably in bed, discussed with nursing staff-patient to be placed on oxygen and transferred to hospital for continued medical management for acute hypoxic respiratory failure most likely secondary to aspiration pneumonia.  VITAL SIGNS:  Blood pressure (!) 149/82, pulse 81, temperature 98 F (36.7 C), temperature source Oral, resp. rate 20, height 5\' 6"  (1.676 m), weight 105 kg, SpO2 98 %.  I/O:    Intake/Output Summary (Last 24 hours) at 07/09/2018 1020 Last data filed at 07/08/2018 1807 Gross per 24 hour  Intake 960 ml   Output -  Net 960 ml    PHYSICAL EXAMINATION:  GENERAL:  48 y.o.-year-old patient lying in the bed with no acute distress.  EYES: Pupils equal, round, reactive to light and accommodation. No scleral icterus. Extraocular muscles intact.  HEENT: Head atraumatic, normocephalic. Oropharynx and nasopharynx clear.  NECK:  Supple, no jugular venous distention. No thyroid enlargement, no tenderness.  LUNGS: Normal breath sounds bilaterally, no wheezing, rales,rhonchi or crepitation. No use of accessory muscles of respiration.  CARDIOVASCULAR: S1, S2 normal. No murmurs, rubs, or gallops.  ABDOMEN: Soft, non-tender, non-distended. Bowel sounds present. No organomegaly or mass.  EXTREMITIES: No pedal edema, cyanosis, or clubbing.  NEUROLOGIC: Cranial nerves II through XII are intact. Muscle strength 5/5 in all extremities. Sensation intact. Gait not checked.  PSYCHIATRIC: The patient is alert and oriented x 3.  SKIN: No obvious rash, lesion, or ulcer.   DATA REVIEW:   CBC Recent Labs  Lab 07/09/18 0436  WBC 9.6  HGB 9.4*  HCT 31.7*  PLT 309    Chemistries  Recent Labs  Lab 07/04/18 2032 07/09/18 0436  NA 136  --   K 3.4*  --   CL 104  --   CO2 24  --   GLUCOSE 100*  --   BUN 9  --   CREATININE 0.78 0.72  CALCIUM 8.8*  --   AST 22  --   ALT 18  --   ALKPHOS 53  --   BILITOT 0.3  --     Cardiac Enzymes No results for input(s): TROPONINI in the last 168 hours.  Microbiology Results  Results for orders placed or performed during the hospital encounter of 06/29/18  Urine culture     Status: None   Collection Time: 06/29/18  6:58 PM  Result Value Ref Range Status   Specimen Description   Final    URINE, RANDOM Performed at [facility], [address], [place], [place] [address]    Special Requests   Final    NONE Performed at [facility], [address], [place], [place] [address]    Culture   Final    NO GROWTH Performed at [facility], [place] [address], [place], [place] [address]    Report Status 07/01/2018 FINAL  Final  MRSA PCR Screening     Status: None   Collection Time: 06/29/18 11:49 PM  Result Value Ref Range Status   MRSA by PCR NEGATIVE NEGATIVE Final    Comment:        The GeneXpert MRSA Assay (FDA approved for NASAL specimens only), is one component of a comprehensive MRSA colonization surveillance program. It is not intended to diagnose MRSA infection nor to guide or monitor treatment for MRSA infections. Performed at [facility], [address] [place], [place] [address]   Culture, respiratory (non-expectorated)     Status: None   Collection Time: 06/30/18  2:21 AM  Result Value Ref Range Status   Specimen Description   Final    TRACHEAL ASPIRATE Performed at [facility], [address] [place],  [address]    Special Requests   Final    Normal Performed at [facility], [place], [place] [address]    Gram Stain   Final    RARE WBC PRESENT,BOTH PMN AND MONONUCLEAR RARE GRAM POSITIVE RODS RARE GRAM POSITIVE COCCI    Culture   Final    FEW Consistent with normal respiratory flora. Performed at [facility], [place] [address], [place],  [address]    Report Status 07/02/2018 FINAL  Final  CULTURE, BLOOD (ROUTINE X 2) w Reflex to ID Panel     Status: None   Collection Time: 06/30/18  5:26 AM  Result Value Ref Range Status   Specimen Description BLOOD RIGHT AC  Final   Special Requests   Final    BOTTLES DRAWN AEROBIC AND ANAEROBIC Blood Culture results may not be optimal due to an excessive volume of blood received in culture bottles   Culture   Final    NO GROWTH 5 DAYS Performed at [facility], [address] [place],  [address]    Report Status 07/05/2018 FINAL  Final  CULTURE, BLOOD (ROUTINE X 2) w Reflex to ID Panel     Status: None   Collection Time: 06/30/18  5:33 AM  Result Value Ref Range Status    Specimen Description BLOOD RIGHT HAND  Final   Special Requests   Final    BOTTLES DRAWN AEROBIC AND ANAEROBIC Blood Culture adequate volume  Culture   Final    NO GROWTH 5 DAYS Performed at [facility], [address] [place], [place] [address]    Report Status 07/05/2018 FINAL  Final    RADIOLOGY:  No results found.  EKG:   Orders placed or performed during the hospital encounter of 07/05/18  . EKG 12-Lead  . EKG 12-Lead  . EKG 12-Lead  . EKG 12-Lead      Management plans discussed with the patient, family and they are in agreement.  CODE STATUS:     Code Status Orders  (From admission, onward)         Start     Ordered   07/04/18 2213  Full code  Continuous     07/04/18 2212        Code Status History    Date Active Date Inactive Code Status Order ID Comments User Context   07/04/2018 2212 07/04/2018 2212 Full Code [id number]  SalaryAvel Peace, MD Inpatient   07/02/2018 1703 07/04/2018 2212 Full Code [id number]  Gonzella Lex, MD Inpatient   06/30/2018 0027 07/02/2018 1658 Full Code [id number]  Lance Coon, MD Inpatient      TOTAL TIME TAKING CARE OF THIS PATIENT: 45 minutes.    Avel Peace Auriana Scalia M.D on 07/09/2018 at 10:20 AM  Between 7am to 6pm - Pager - [phone]  After 6pm go to [email or web address] - password [email or web address] [facility]  Office  [phone]  CC: Primary care physician; Ronnell Freshwater, PA-C   Note: This dictation was prepared with Dragon dictation along with smaller phrase technology. Any transcriptional errors that result from this process are unintentional.

## 2018-07-09 NOTE — Progress Notes (Signed)
Discharge summary reviewed with verbal understanding. Rxs given, belongings packed. Escorted to personal vehicle via wc.

## 2018-07-09 NOTE — Consult Note (Addendum)
[facility] Face-to-Face Psychiatry Consult Follow up  Reason for Consult:  Psychiatric Evaluation-evaluate for readmission to [facility] unit once medically cleared Referring Physician:  Loney Hering, MD Patient Identification: Lisa Vasquez MRN:  [id number] Principal Diagnosis: <principal problem not specified> Diagnosis:  Active Problems:   Respiratory failure ([place])   Total Time spent with patient, reviewing chart, discussing plan of care with treatment team, and talking with pt's husband: 45 minutes  Subjective:   Lisa Vasquez is a 48 y.o. female patient transferred from behavioral health unit to medical floor due to hypoxia secondary to pneumonia (likely aspiration pneumonia s/p overdose).    07/09/2018 Pt reports she's doing great.  Her mood has stabilized on latuda and zoloft combination.  She denies SI, HI, AH, VH.  She feels like she's received great care in the hospital. She's happy her blood pressure has improved.  She's no longer feeling anxious, depressed or manic.   She would like to follow up at [facility] in [place] for her outpt psychiatric care.   Pt denies SI, HI, AH, VH, paranoia.   Pt also consents for me to speak with her husband, [name] [phone].  Donnie states he has visited and spoken with his wife several times over the past week and she is doing great.  He states "I finally got my wife back."  He feels that her mood is stable and even states this is the best he's seen her in a while.  He's happy her blood pressure is under better control.  He also state he's removed all the alcohol from the home.   He has a gun that he will make sure is secured away from his wife.   Pt is aware of the gun and states she has no thoughts of killing herself and is onboard with her husband securing it.  She states she doesn't even know were the gun is at this point.    Pt also states she wants to go to [employer] and she's interested in [place]. Social worker  to talk with pt about local [place] meetings and provide pt with booklet on local behavioral health and substance abuse resources.   Past Psychiatric History:  Short inpatient stay at [facility] (transferred to medical floor due to aspiration pneumonia/hypoxia).  Saw a psychiatrist at [facility] in [place] over 10 years ago.  Has been on  Zoloft for years prescribed by PCP.    Risk to Self:  not at imminent risk to self; pt denies active SI Risk to Others:   pt denies HI Prior Inpatient Therapy:   pt denies Prior Outpatient Therapy:  yes  Past Medical History:  Past Medical History:  Diagnosis Date  . Anemia   . Hypertension   . Skin cancer     Past Surgical History:  Procedure Laterality Date  . CERVICAL ABLATION    . EYE SURGERY    . NASAL SINUS SURGERY  2001  . STRABISMUS SURGERY Right   . WISDOM TOOTH EXTRACTION     Family History:  Family History  Problem Relation Age of Onset  . COPD Mother   . Skin cancer Mother   . Heart defect Mother   . Heart attack Maternal Aunt   . Heart attack Maternal Uncle   . Breast cancer Maternal Grandmother   . Melanoma Maternal Uncle    Family Psychiatric  History: pt states her 85 yo daughter has bipolar disorder and sees a Teacher, music at [facility].  Social History:  Social History   Substance and Sexual Activity  Alcohol Use Yes   Comment: "too much"     Social History   Substance and Sexual Activity  Drug Use Not Currently  . Types: Cocaine    Social History   Socioeconomic History  . Marital status: Married    Spouse name: Not on file  . Number of children: Not on file  . Years of education: Not on file  . Highest education level: Not on file  Occupational History  . Not on file  Social Needs  . Financial resource strain: Somewhat hard  . Food insecurity:    Worry: Often true    Inability: Often true  . Transportation needs:    Medical: Yes    Non-medical: Yes  Tobacco Use  .  Smoking status: Former Research scientist (life sciences)  . Smokeless tobacco: Never Used  Substance and Sexual Activity  . Alcohol use: Yes    Comment: "too much"  . Drug use: Not Currently    Types: Cocaine  . Sexual activity: Not on file  Lifestyle  . Physical activity:    Days per week: 7 days    Minutes per session: 20 min  . Stress: Very much  Relationships  . Social connections:    Talks on phone: Never    Gets together: Never    Attends religious service: Never    Active member of club or organization: Yes    Attends meetings of clubs or organizations: Never    Relationship status: Married  Other Topics Concern  . Not on file  Social History Narrative  . Not on file   Substance abuse history: The patient has been drinking 750 mL of wine per day for at least the past 15 years.  She denies history of cannabis, cocaine, opioid, stimulant use or any other illicit substances.  She quit smoking cigarettes in 2004.    Additional Social History: Lives with husband, daughter and daughter's boyfriend.  Also has a dog that she adores.  Per Dr. Waylan Boga note: "The patient was born at and raised in [place], [place] by her mother and stepfather as she did not know her biological father.  She did have a close relationship with her parents and denies any history of any physical or sexual abuse.  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  She has been married for over 69 years and has one 20 year old daughter.  She and her husband and daughter live in [place]."    Legal history: Pt is facing multiple pending charges for theft and fraud  Allergies:   Allergies  Allergen Reactions  . Ondansetron Other (See Comments)    Constipation     Labs:  Results for orders placed or performed during the hospital encounter of  07/05/18 (from the past 48 hour(s))  CBC     Status: Abnormal   Collection Time: 07/09/18  4:36 AM  Result Value Ref Range   WBC 9.6 4.0 - 10.5 K/uL   RBC 3.92 3.87 - 5.11 MIL/uL   Hemoglobin 9.4 (L) 12.0 - 15.0 g/dL   HCT 31.7 (L) 36.0 - 46.0 %   MCV 80.9 80.0 - 100.0 fL   MCH 24.0 (L) 26.0 - 34.0 pg   MCHC 29.7 (L) 30.0 -  36.0 g/dL   RDW 18.7 (H) 11.5 - 15.5 %   Platelets 309 150 - 400 K/uL   nRBC 0.3 (H) 0.0 - 0.2 %    Comment: Performed at [facility], [address] [place], [place] [address]  Creatinine, serum     Status: None   Collection Time: 07/09/18  4:36 AM  Result Value Ref Range   Creatinine, Ser 0.72 0.44 - 1.00 mg/dL   GFR calc non Af Amer >60 >60 mL/min   GFR calc Af Amer >60 >60 mL/min    Comment: Performed at [facility], [address], [place],  [address]    Current Facility-Administered Medications  Medication Dose Route Frequency Provider Last Rate Last Dose  . acetaminophen (TYLENOL) tablet 650 mg  650 mg Oral Q6H PRN Salary, Montell D, MD      . albuterol (PROVENTIL) (2.5 MG/3ML) 0.083% nebulizer solution 2.5 mg  2.5 mg Nebulization Q2H PRN Salary, Montell D, MD      . alum & mag hydroxide-simeth (MAALOX/MYLANTA) 200-200-20 MG/5ML suspension 30 mL  30 mL Oral Q4H PRN Salary, Montell D, MD      . amLODipine (NORVASC) tablet 10 mg  10 mg Oral QHS Salary, Montell D, MD   10 mg at 07/08/18 2144  . cefTRIAXone (ROCEPHIN) 2 g in sodium chloride 0.9 % 100 mL IVPB  2 g Intravenous Q24H Salary, Montell D, MD 200 mL/hr at 07/08/18 1603 2 g at 07/08/18 1603  . clonazePAM (KLONOPIN) tablet 0.5 mg  0.5 mg Oral BID PRN Salary, Montell D, MD   0.5 mg at 07/08/18 1404  . cloNIDine (CATAPRES) tablet 0.1 mg  0.1 mg Oral TID Loney Hering D, MD   0.1 mg at 07/09/18 1002  . enoxaparin (LOVENOX) injection 40 mg  40 mg Subcutaneous Q24H Salary, Montell D, MD   40 mg at 07/08/18 1558  . ferrous sulfate tablet 325 mg  325 mg Oral BID WC Salary,  Montell D, MD   325 mg at 07/09/18 0757  . fluticasone (FLONASE) 50 MCG/ACT nasal spray 2 spray  2 spray Each Nare Daily Salary, Montell D, MD   2 spray at 07/09/18 0757  . folic acid (FOLVITE) tablet 1 mg  1 mg Oral Daily Salary, Montell D, MD   1 mg at 07/09/18 1026  . hydrALAZINE (APRESOLINE) injection 10 mg  10 mg Intravenous Q4H PRN Salary, Holly Bodily D, MD   10 mg at 07/08/18 1405  . hydrALAZINE (APRESOLINE) tablet 50 mg  50 mg Oral TID Loletha Grayer, MD   50 mg at 07/09/18 1003  . HYDROcodone-acetaminophen (NORCO/VICODIN) 5-325 MG per tablet 1-2 tablet  1-2 tablet Oral Q4H PRN Salary, Avel Peace, MD   1 tablet at 07/08/18 0154  . lisinopril (PRINIVIL,ZESTRIL) tablet 20 mg  20 mg Oral BID Loletha Grayer, MD   20 mg at 07/09/18 1002  . lurasidone (LATUDA) tablet 40 mg  40 mg Oral Q breakfast Salary, Montell D, MD   40 mg at 07/09/18 1002  . magnesium hydroxide (MILK OF MAGNESIA) suspension 30 mL  30 mL Oral Daily PRN Salary, Montell D, MD      . metoprolol succinate (TOPROL-XL) 24 hr tablet 50 mg  50 mg Oral Daily Salary, Montell D, MD   50 mg at 07/09/18 1029  . multivitamin with minerals tablet 1 tablet  1 tablet Oral Daily Salary, Montell D, MD   1 tablet at 07/09/18 1002  . pantoprazole (PROTONIX) EC tablet 40  mg  40 mg Oral BID AC Salary, Montell D, MD   40 mg at 07/09/18 0757  . promethazine (PHENERGAN) tablet 12.5 mg  12.5 mg Oral Q6H PRN Salary, Montell D, MD      . sertraline (ZOLOFT) tablet 100 mg  100 mg Oral Daily Salary, Montell D, MD   100 mg at 07/09/18 1003  . simvastatin (ZOCOR) tablet 40 mg  40 mg Oral Daily Salary, Montell D, MD   40 mg at 07/09/18 1003  . thiamine (VITAMIN B-1) tablet 100 mg  100 mg Oral Daily Salary, Montell D, MD   100 mg at 07/09/18 1002   Or  . thiamine (B-1) injection 100 mg  100 mg Intravenous Daily Salary, Montell D, MD      . traZODone (DESYREL) tablet 100 mg  100 mg Oral QHS PRN Salary, Montell D, MD      . vitamin B-12 (CYANOCOBALAMIN) tablet  1,000 mcg  1,000 mcg Oral Daily Salary, Montell D, MD   1,000 mcg at 07/08/18 3151    Musculoskeletal: Strength & Muscle Tone: within normal limits Gait & Station: normal Patient leans: N/A  Psychiatric Specialty Exam: Physical Exam  Nursing note and vitals reviewed. Constitutional: She is oriented to person, place, and time. She appears well-developed and well-nourished. No distress.  HENT:  Head: Normocephalic and atraumatic.  Respiratory: Effort normal. No respiratory distress.  GI: Bowel sounds are normal.  Musculoskeletal: Normal range of motion.  Neurological: She is oriented to person, place, and time.  Skin: Skin is warm and dry.  Psychiatric: She has a normal mood and affect. Her speech is normal and behavior is normal. Judgment and thought content normal. She is not agitated, not aggressive, not hyperactive, not actively hallucinating and not combative. Thought content is not paranoid. Cognition and memory are normal. She expresses no homicidal and no suicidal ideation.      Review of Systems  Constitutional: Negative for fever.  Eyes: Negative.   Respiratory: Negative for shortness of breath.   Cardiovascular: Negative.   Gastrointestinal: Negative.   Genitourinary: Negative.   Musculoskeletal: Negative.   Neurological: Negative.   Psychiatric/Behavioral: Negative for depression, hallucinations, memory loss and suicidal ideas. The patient is not nervous/anxious and does not have insomnia.     Blood pressure (!) 149/82, pulse 81, temperature 98 F (36.7 C), temperature source Oral, resp. rate 20, height 5\' 6"  (1.676 m), weight 105 kg, SpO2 98 %.Body mass index is 37.36 kg/m.  General Appearance: Casual and Well Groomed  Eye Contact:  Good  Speech:  Clear and Coherent and Normal Rate  Volume:  Normal  Mood:  "great"  Affect:  Appropriate and full range  Thought Process:  Coherent and Linear  Orientation:  Full (Time, Place, and Person)  Thought Content:  Logical   Suicidal Thoughts:  patient denies active SI  Homicidal Thoughts:  pt denies HI  Memory:  grossly intact  Judgement:  Intact  Insight:  Good  Psychomotor Activity:  Normal  Concentration:  Concentration: grossly intact and Attention Span: grossly intact  Recall:  intact  Fund of Knowledge:  Good  Language:  Good  Akathisia:  Negative  AIMS (if indicated):     Assets:  Communication Skills Desire for Improvement Housing Physical Health Resilience Social Support  ADL's:  Intact  Cognition:  WNL  Sleep:   improved per pt since starting [place] Summary:  Bipolar disorder, type I Alcohol use disorder, severe  Currently,  pt's acute suicide risk is deemed low.  She's has been on the medical ward for the past 5 days receiving treatment for pneumonia, hypertension and right arm cellulitis.   Pt also has received treatment for bipolar disorder and suicide attempt.  Pt displayed no dangerous or self-injurious behaviors while on the medical ward.  She was compliant with her medications.  Pt's mood has stabilized on latuda and zoloft.  She denies suicidal ideation, homicidal ideation, auditory and visual hallucinations.  She plans to attend AA meetings and outpt psychiatric care at [facility] in [place].  She also will be provided information on SAIOP.  I recommend SAIOP given her history of alcohol use disorder, severe.   Risks (including but not limited serotonin syndrome and neuromalignant syndrome) and benefits of her psychotropic medications were discussed with the pt and she voices understanding and consents to the current treatment plan, including use of the medications.   Suicide risk assessment: Risk factors: history of suicide attempts, history of bipolar disorder, history of alcohol use disorder, family history of mental illness, legal issues.  Protective factors: supportive family, denial of active suicidal ideation, pt denies access to  firearm, husband states he has a firearm, but wife doesn't have access to it and he [husband] reports that he will make sure firearm is secured away from pt, willingness to seek help. Future/goal-oriented.  Crisis plan discussed: pt agrees to active her crisis plan if she becomes suicidal.   Acute suicide risk: low Crisis plan: call 911, return to ER/hospital, talk to family/friends, call outpt providers, call crisis hotlines.    IVC rescinded.    Disposition: No evidence of imminent risk to self or others at present.   Patient does not meet criteria for psychiatric inpatient admission. Supportive therapy provided about ongoing stressors. Recommend Referral to IOP; recommendation to IOP discussed with patient, primary attending and social worker.  Social worker to provide patient with handouts on local substance abuse and behavioral health resources. Discussed crisis plan, support from social network, calling 911, coming to the Emergency Department, and calling Suicide Hotline.   Tennis Ship, MD 07/09/2018 10:30 AM

## 2018-07-09 NOTE — TOC Transition Note (Signed)
Transition of Care [facility]) - CM/SW Discharge Note   Patient Details  Name: TEREZA GILHAM MRN: [id number] Date of Birth: Jul 01, 1970  Transition of Care [facility]) CM/SW Contact:  Su Hilt, RN Phone Number: 07/09/2018, 10:50 AM   Clinical Narrative:     Patient to be discharged home and approved by Psychiatry.  Provided the patient with the the Network Booklet with resources, Provided the patient with a list of Alcohol resources in [employer].  Secretary is making appointment for Outpatient psychiatry at [facility] in [place].    Final next level of care: Home/Self Care Barriers to Discharge: Barriers Resolved   Patient Goals and CMS Choice Patient states their goals for this hospitalization and ongoing recovery are:: go home      Discharge Placement                       Discharge Plan and Services                          Social Determinants of Health (SDOH) Interventions     Readmission Risk Interventions No flowsheet data found.

## 2018-07-16 ENCOUNTER — Ambulatory Visit: Payer: Self-pay | Admitting: Family Medicine

## 2018-07-28 ENCOUNTER — Encounter: Payer: Self-pay | Admitting: Family Medicine

## 2018-08-09 ENCOUNTER — Encounter: Payer: Self-pay | Admitting: Family Medicine

## 2018-08-09 ENCOUNTER — Encounter: Payer: BLUE CROSS/BLUE SHIELD | Admitting: Family Medicine

## 2018-08-09 NOTE — Progress Notes (Signed)
       Patient: Lisa Vasquez, Female    DOB: 04-Feb-1971, 48 y.o.   MRN: [id number] Visit Date: 08/09/2018  Today's Provider: Lavon Paganini, MD   Chief Complaint  Patient presents with  . Establish Care    Patient no showed new patient evisit encounter.  Morgan Rennert, Dionne Bucy, MD, MPH [facility]  This encounter was created in error - please disregard.

## 2018-08-11 ENCOUNTER — Encounter: Payer: Self-pay | Admitting: Family Medicine

## 2018-08-20 ENCOUNTER — Encounter: Payer: Self-pay | Admitting: Family Medicine

## 2018-10-04 ENCOUNTER — Encounter: Payer: Self-pay | Admitting: Family Medicine

## 2018-10-21 ENCOUNTER — Ambulatory Visit: Payer: Self-pay | Admitting: Family Medicine

## 2018-12-01 NOTE — Progress Notes (Signed)
Patient: Lisa Vasquez, Female    DOB: 18-Dec-1970, 48 y.o.   MRN: [id number] Visit Date: 12/02/2018  Today's Provider: Lavon Paganini, MD   Chief Complaint  Patient presents with   New Patient (Initial Visit)   Subjective:     New Patient:  Lisa Vasquez is a 48 y.o. female who presents today to Establish Care.  She feels fairly well. She reports exercising noe. She reports she is sleeping fairly well.  -----------------------------------------------------------------  Previous PCP: Roxan Hockey at [facility] in [place] that she is way behind on screenings and wants to get herself healthy again  Patient was admitted to [facility] on 06/29/18 after being found at home unresponsive and brought in by EMS.  Was found to be hypoxic and intubated in the ED.  She had been drinking excessively and overdosed on nortriptyline.  She was hospitalized for 10 days total.  She is now followed by a psychiatrist (see below) and is taking her medications regularly.  She has cut back on alcohol intake to 1-2 glasses of wine each night since her suicide attempt.  Has a history, but no current use, of cocaine and THC use.  Bipolar disorder, anxiety, depression: Was previously followed at [facility] (Dr. Dorann Ou).  They are no longer in her insurance network.  She is taking Zoloft 250mg  daily and Latuda 80mg  daily.   Seeing pain clinic for arthritis. Getting ESI. No opioids.  HTN: - Medications: lisinopril 20mg  daily, hydralazine 50mg  TID, Metoprolol XL 50mg  daily, clonidine 0.1mg  TID, amlodipine 10mg  daily - Compliance: good - Checking BP at home: 120s-130/80s - Denies any SOB, CP, vision changes, LE edema, medication SEs, or symptoms of hypotension - Diet: low sodium  GERD: Taking Omeprazole daily and it is fairly well controlled.  She has lost some weight and this has helped.  Also has known hiatal hernia.  History of squamous cell skin cancer. Has not seen  Derm in a few years.  No suspicious lesions currently.  History of smoking for 15 years, <0.5 PPD   Review of Systems  Constitutional: Positive for fatigue.  HENT: Negative.   Eyes: Negative.   Respiratory: Positive for shortness of breath.   Cardiovascular: Negative.   Gastrointestinal: Negative.   Endocrine: Positive for polydipsia.  Genitourinary: Negative.   Musculoskeletal: Positive for arthralgias, neck pain and neck stiffness.  Skin: Negative.   Allergic/Immunologic: Positive for environmental allergies.  Neurological: Positive for headaches.  Hematological: Negative.   Psychiatric/Behavioral: Positive for sleep disturbance. The patient is nervous/anxious.     Social History      She  reports that she quit smoking about 15 years ago. Her smoking use included cigarettes. She has a 3.00 pack-year smoking history. She has never used smokeless tobacco. She reports current alcohol use of about 8.0 standard drinks of alcohol per week. She reports previous drug use. Drug: Cocaine.       Social History   Socioeconomic History   Marital status: Married    Spouse name: Not on file   Number of children: 1   Years of education: Not on file   Highest education level: Not on file  Occupational History   Occupation: unemployed  Social Designer, fashion/clothing strain: Somewhat hard   Food insecurity    Worry: Often true    Inability: Often true   Transportation needs    Medical: Yes    Non-medical: Yes  Tobacco Use  Smoking status: Former Smoker    Packs/day: 0.20    Years: 15.00    Pack years: 3.00    Types: Cigarettes    Quit date: 11/20/2003    Years since quitting: 15.0   Smokeless tobacco: Never Used  Substance and Sexual Activity   Alcohol use: Yes    Alcohol/week: 8.0 standard drinks    Types: 8 Glasses of wine per week   Drug use: Not Currently    Types: Cocaine   Sexual activity: Yes    Partners: Male    Birth control/protection: Surgical    Lifestyle   Physical activity    Days per week: 7 days    Minutes per session: 20 min   Stress: Very much  Relationships   Social connections    Talks on phone: Never    Gets together: Never    Attends religious service: Never    Active member of club or organization: Yes    Attends meetings of clubs or organizations: Never    Relationship status: Married  Other Topics Concern   Not on file  Social History Narrative   Not on file    Past Medical History:  Diagnosis Date   Anemia    Anxiety    Arthritis    Blood transfusion without reported diagnosis    Depression    GERD (gastroesophageal reflux disease)    Heart murmur    Hypertension    Skin cancer      Patient Active Problem List   Diagnosis Date Noted   History of SCC (squamous cell carcinoma) of skin 12/02/2018   Bipolar disorder ([place]) 12/02/2018   Anxiety 12/02/2018   Alcohol use disorder, moderate, dependence ([place])    Severe recurrent major depression without psychotic features ([place]) 07/02/2018   History of drug overdose 06/29/2018   HTN (hypertension) 06/29/2018   Iron deficiency anemia 12/26/2015   Low vitamin B12 level 12/26/2015   Anemia 12/19/2015    Past Surgical History:  Procedure Laterality Date   BUNIONECTOMY Right 1998   CERVICAL ABLATION     EYE SURGERY     NASAL SINUS SURGERY  2001   STRABISMUS SURGERY Right    WISDOM TOOTH EXTRACTION      Family History        Family Status  Relation Name Status   Mother  Alive   Mat Aunt  Deceased   Mat Uncle  Deceased   MGM  Deceased   Mat Uncle  Deceased   Daughter  Alive   MGF  Deceased   PGM  Deceased   PGF  Deceased   Neg Hx  (Not Specified)        Her family history includes Bipolar disorder in her daughter; Breast cancer in her maternal grandmother; COPD in her mother; Cancer in her maternal aunt and maternal uncle; Depression in her daughter; Heart attack in her maternal aunt and maternal  uncle; Heart defect in her mother; Heart disease in her maternal aunt and maternal uncle; Melanoma in her maternal uncle; Skin cancer in her mother. There is no history of Colon cancer.      Allergies  Allergen Reactions   Ondansetron Other (See Comments)    Constipation      Current Outpatient Medications:    amLODipine (NORVASC) 10 MG tablet, Take 1 tablet (10 mg total) by mouth at bedtime., Disp: 30 tablet, Rfl: 0   cloNIDine (CATAPRES) 0.1 MG tablet, Take 1 tablet (0.1 mg total) by mouth 3 (three)  times daily., Disp: 90 tablet, Rfl: 0   ferrous sulfate 325 (65 FE) MG tablet, Take 1 tablet (325 mg total) by mouth 2 (two) times daily with a meal., Disp: 60 tablet, Rfl: 2   fluticasone (FLONASE) 50 MCG/ACT nasal spray, Place 2 sprays into both nostrils daily., Disp: 16 g, Rfl: 5   hydrALAZINE (APRESOLINE) 50 MG tablet, Take 1 tablet (50 mg total) by mouth 3 (three) times daily., Disp: 90 tablet, Rfl: 0   lisinopril (PRINIVIL,ZESTRIL) 20 MG tablet, Take 1 tablet (20 mg total) by mouth daily., Disp: 30 tablet, Rfl: 2   lurasidone (LATUDA) 80 MG TABS tablet, Take 1 tablet (80 mg total) by mouth daily with breakfast., Disp: 30 tablet, Rfl: 0   metoprolol succinate (TOPROL-XL) 50 MG 24 hr tablet, Take 1 tablet (50 mg total) by mouth daily. Take with or immediately following a meal., Disp: 30 tablet, Rfl: 2   Multiple Vitamin (MULTIVITAMIN WITH MINERALS) TABS tablet, Take 1 tablet by mouth daily., Disp: 180 tablet, Rfl: 0   omeprazole (PRILOSEC) 40 MG capsule, Take 1 capsule (40 mg total) by mouth daily., Disp: 90 capsule, Rfl: 5   sertraline (ZOLOFT) 100 MG tablet, Take 2.5 tablets (250 mg total) by mouth daily., Disp: 75 tablet, Rfl: 0   simvastatin (ZOCOR) 40 MG tablet, Take 40 mg by mouth daily., Disp: , Rfl:    vitamin B-12 (CYANOCOBALAMIN) 500 MCG tablet, Take 500 mcg by mouth daily., Disp: , Rfl:    Patient Care Team: Virginia Crews, MD as PCP - General (Family  Medicine) Lin Landsman, MD as Consulting Physician (Gastroenterology)    Objective:    Vitals: There were no vitals taken for this visit.  There were no vitals filed for this visit.   Physical Exam Constitutional:      General: She is not in acute distress.    Appearance: Normal appearance. She is not diaphoretic.  HENT:     Head: Normocephalic and atraumatic.  Eyes:     General: No scleral icterus.    Conjunctiva/sclera: Conjunctivae normal.  Pulmonary:     Effort: Pulmonary effort is normal. No respiratory distress.  Neurological:     Mental Status: She is alert and oriented to person, place, and time.  Psychiatric:        Mood and Affect: Mood normal.        Behavior: Behavior normal.      Depression Screen PHQ 2/9 Scores 12/02/2018 08/09/2018  PHQ - 2 Score 0 1  PHQ- 9 Score 6 8       Assessment & Plan:     Establish Care  Exercise Activities and Dietary recommendations Goals   None      There is no immunization history on file for this patient.  Health Maintenance  Topic Date Due   TETANUS/TDAP  02/21/1988   PAP SMEAR-Modifier  02/20/1990   INFLUENZA VACCINE  11/20/2018   HIV Screening  Completed     Discussed health benefits of physical activity, and encouraged her to engage in regular exercise appropriate for her age and condition.    Will send ROI to previous PCP and Psychiatry --------------------------------------------------------------------  Problem List Items Addressed This Visit      Cardiovascular and Mediastinum   HTN (hypertension) - Primary    Reportedly well controlled on home readings Recheck BMP Continue current meds F/u in 3 months      Relevant Orders   Basic Metabolic Panel (BMET)     Other  Iron deficiency anemia    Noted during hospitalization Continue Iron supplement Recheck CBC and iron panel Patient no longer having menses and denies any bleeding      Relevant Medications   vitamin B-12  (CYANOCOBALAMIN) 500 MCG tablet   ferrous sulfate 325 (65 FE) MG tablet   Other Relevant Orders   CBC w/Diff/Platelet   Fe+TIBC+Fer   Low vitamin B12 level    Continue supplement Recheck B12 levels      Relevant Orders   B12   History of drug overdose    Followed by Psychiatry Has cut back on alcohol intake as well Would avoid TCAs      Relevant Orders   Ambulatory referral to Psychiatry   Severe recurrent major depression without psychotic features ([place])    Followed by psych Fairly well controlled No changes to medications      Relevant Medications   sertraline (ZOLOFT) 100 MG tablet   Other Relevant Orders   Ambulatory referral to Psychiatry   Alcohol use disorder, moderate, dependence ([place])    Has cut back significantly Followed by Psych Advised on appropriate intake levels and excessive drinking      Relevant Orders   Ambulatory referral to Psychiatry   History of SCC (squamous cell carcinoma) of skin    Discussed importance of seeing Derm annually for skin checks Referral placed      Relevant Orders   Ambulatory referral to Dermatology   Bipolar disorder ([place])    Followed by Psych Well controlled currently No changes to medications Contracted for safety      Relevant Orders   Ambulatory referral to Psychiatry   Anxiety    Followed by psych Continue current meds Referral to new psychiatrist placed      Relevant Medications   sertraline (ZOLOFT) 100 MG tablet   Other Relevant Orders   Ambulatory referral to Psychiatry       Return in about 3 months (around 03/04/2019) for CPE.   The entirety of the information documented in the History of Present Illness, Review of Systems and Physical Exam were personally obtained by me. Portions of this information were initially documented by Tiburcio Pea, CMA and reviewed by me for thoroughness and accuracy.    Omer Puccinelli, Dionne Bucy, MD MPH [facility]

## 2018-12-02 ENCOUNTER — Encounter: Payer: Self-pay | Admitting: Family Medicine

## 2018-12-02 ENCOUNTER — Ambulatory Visit (INDEPENDENT_AMBULATORY_CARE_PROVIDER_SITE_OTHER): Payer: BLUE CROSS/BLUE SHIELD | Admitting: Family Medicine

## 2018-12-02 DIAGNOSIS — E538 Deficiency of other specified B group vitamins: Secondary | ICD-10-CM

## 2018-12-02 DIAGNOSIS — F3170 Bipolar disorder, currently in remission, most recent episode unspecified: Secondary | ICD-10-CM

## 2018-12-02 DIAGNOSIS — D509 Iron deficiency anemia, unspecified: Secondary | ICD-10-CM | POA: Diagnosis not present

## 2018-12-02 DIAGNOSIS — I10 Essential (primary) hypertension: Secondary | ICD-10-CM | POA: Diagnosis not present

## 2018-12-02 DIAGNOSIS — F319 Bipolar disorder, unspecified: Secondary | ICD-10-CM | POA: Insufficient documentation

## 2018-12-02 DIAGNOSIS — F332 Major depressive disorder, recurrent severe without psychotic features: Secondary | ICD-10-CM | POA: Diagnosis not present

## 2018-12-02 DIAGNOSIS — Z85828 Personal history of other malignant neoplasm of skin: Secondary | ICD-10-CM

## 2018-12-02 DIAGNOSIS — F419 Anxiety disorder, unspecified: Secondary | ICD-10-CM

## 2018-12-02 DIAGNOSIS — F1020 Alcohol dependence, uncomplicated: Secondary | ICD-10-CM

## 2018-12-02 DIAGNOSIS — Z9189 Other specified personal risk factors, not elsewhere classified: Secondary | ICD-10-CM

## 2018-12-02 DIAGNOSIS — R7989 Other specified abnormal findings of blood chemistry: Secondary | ICD-10-CM

## 2018-12-02 MED ORDER — FERROUS SULFATE 325 (65 FE) MG PO TABS: 325.0000 mg | ORAL_TABLET | Freq: Two times a day (BID) | ORAL | 2 refills | Status: AC

## 2018-12-02 MED ORDER — FLUTICASONE PROPIONATE 50 MCG/ACT NA SUSP: 2.0000 | Freq: Every day | NASAL | 5 refills | Status: DC

## 2018-12-02 MED ORDER — LURASIDONE HCL 80 MG PO TABS: 80.0000 mg | ORAL_TABLET | Freq: Every day | ORAL | 0 refills | Status: DC

## 2018-12-02 MED ORDER — OMEPRAZOLE 40 MG PO CPDR: 40.0000 mg | DELAYED_RELEASE_CAPSULE | Freq: Every day | ORAL | 5 refills | Status: DC

## 2018-12-02 MED ORDER — SERTRALINE HCL 100 MG PO TABS: 250.0000 mg | ORAL_TABLET | Freq: Every day | ORAL | 0 refills | Status: DC

## 2018-12-02 NOTE — Assessment & Plan Note (Signed)
Reportedly well controlled on home readings Recheck BMP Continue current meds F/u in 3 months

## 2018-12-02 NOTE — Assessment & Plan Note (Signed)
Has cut back significantly Followed by Psych Advised on appropriate intake levels and excessive drinking

## 2018-12-02 NOTE — Assessment & Plan Note (Signed)
Continue supplement Recheck B12 levels

## 2018-12-02 NOTE — Assessment & Plan Note (Signed)
Noted during hospitalization Continue Iron supplement Recheck CBC and iron panel Patient no longer having menses and denies any bleeding

## 2018-12-02 NOTE — Assessment & Plan Note (Signed)
Discussed importance of seeing Derm annually for skin checks Referral placed

## 2018-12-02 NOTE — Assessment & Plan Note (Signed)
Followed by Psychiatry Has cut back on alcohol intake as well Would avoid TCAs

## 2018-12-02 NOTE — Assessment & Plan Note (Signed)
Followed by psych Fairly well controlled No changes to medications

## 2018-12-02 NOTE — Assessment & Plan Note (Signed)
Followed by Psych Well controlled currently No changes to medications Contracted for safety

## 2018-12-02 NOTE — Assessment & Plan Note (Signed)
Followed by psych Continue current meds Referral to new psychiatrist placed

## 2019-02-10 ENCOUNTER — Encounter: Payer: Self-pay | Admitting: Psychiatry

## 2019-02-10 ENCOUNTER — Other Ambulatory Visit: Payer: Self-pay

## 2019-02-10 ENCOUNTER — Ambulatory Visit (INDEPENDENT_AMBULATORY_CARE_PROVIDER_SITE_OTHER): Payer: BLUE CROSS/BLUE SHIELD | Admitting: Psychiatry

## 2019-02-10 DIAGNOSIS — F319 Bipolar disorder, unspecified: Secondary | ICD-10-CM | POA: Diagnosis not present

## 2019-02-10 DIAGNOSIS — F1021 Alcohol dependence, in remission: Secondary | ICD-10-CM | POA: Insufficient documentation

## 2019-02-10 MED ORDER — SERTRALINE HCL 100 MG PO TABS: 250.0000 mg | ORAL_TABLET | Freq: Every day | ORAL | 0 refills | Status: DC

## 2019-02-10 MED ORDER — LURASIDONE HCL 80 MG PO TABS: ORAL_TABLET | ORAL | 0 refills | Status: DC

## 2019-02-10 NOTE — Progress Notes (Signed)
Psychiatric Initial Adult Assessment  I connected with  Lisa Vasquez on 02/10/19 by a video enabled telemedicine application and verified that I am speaking with the correct person using two identifiers.   I discussed the limitations of evaluation and management by telemedicine. The patient expressed understanding and agreed to proceed.    Patient Identification: Lisa Vasquez MRN:  [id number] Date of Evaluation:  02/10/2019   Referral Source: (PCP) Dr. Brita Romp, [facility]  Chief Complaint:   " I am doing much better on this medication combination."  Visit Diagnosis: Bipolar I disorder ([place]) - Plan: lurasidone (LATUDA) 80 MG TABS tablet, sertraline (ZOLOFT) 100 MG tablet  Alcohol use disorder, moderate, in early remission ([place])    History of Present Illness:  Pt is a 48 year old with hx of bipolar disorder and prior psychiatric hospitalization. Pt reported a long hx of bipolar disorder with multiple episodes of depression cycling with mania. She has ben managed by psychiatrist (Dr. Dorann Ou) at [facility] for past few months, however, they are no longer in her insurance network so she has switched her services with [place]. She is currently stabilized on combination of Latuda 80 mg HS and Sertraline 250 mg daily. She feels that this regimen has helped her immensely. Her mood is much stable and she is able to function with out too many highs or lows. She is able to sleep better. She has been consistent in taking her meds unlike in the past and with that she has been able to recognize the days when she is feeling depressed or manic or hypomanic. She denied any side effects to her medications. 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  She denied any current suicidal ideations. She does have hx of a prior suicide attempt by overdose in March 2020. She used to drink heavily but  has not been doing so for past 6 months.  She feels well supported by her husband and daughter.  Associated Signs/Symptoms: Depression Symptoms:  denied (Hypo) Manic Symptoms:  denied at present Anxiety Symptoms:  Denied at present Psychotic Symptoms:  denied PTSD Symptoms: denied  Past Psychiatric History: Pt has a long hx of bipolar disorder and was seeing a psychiatrist at [facility] in [place] over 10 years ago. However, she stopped following up for a period of 10 years. Last fall she went in to a prolonged manic phase and stole credit cards from colleagues at work for shopping sprees. She ultimately resigned from her job and no has pending legal charges against her. She became depressed following that and in March 2020 she overdosed on Clonazepam and was admitted to in-pt unit at [facility] for stabilization. This was her first psychiatric hospitalization.  Previous Psychotropic Medications: Yes  She cannot remember any prior mood stabilizers but has been on Zoloft from her PCP for several years. Currently on Zoloft 250 mg daily and Latuda 80 mg HS  Substance Abuse History in the last 12 months:  Yes.  , Alcohol abuse, was drinking wine daily for 20 years, now in remission since last 6 months  Consequences of Substance Abuse: Medical Consequences:  overdosed while intoxicated  Past Medical History:  Past Medical History:  Diagnosis Date  . Anemia   . Anxiety   . Arthritis   . Blood transfusion without reported diagnosis   . Depression   . GERD (gastroesophageal reflux disease)   .  Heart murmur   . Hypertension   . Skin cancer     Past Surgical History:  Procedure Laterality Date  . BUNIONECTOMY Right 1998  . CERVICAL ABLATION    . EYE SURGERY    . NASAL SINUS SURGERY  2001  . STRABISMUS SURGERY Right   . WISDOM TOOTH EXTRACTION      Family Psychiatric History: The patient's daughter has bipolar disorder type II and sees a psychiatrist  Family History:   Family History  Problem Relation Age of Onset  . COPD Mother   . Skin cancer Mother   . Heart defect Mother   . Heart attack Maternal Aunt   . Cancer Maternal Aunt   . Heart disease Maternal Aunt   . Heart attack Maternal Uncle   . Cancer Maternal Uncle   . Heart disease Maternal Uncle   . Breast cancer Maternal Grandmother   . Melanoma Maternal Uncle   . Depression Daughter   . Bipolar disorder Daughter   . Colon cancer Neg Hx     Social History:   Social History   Socioeconomic History  . Marital status: Married    Spouse name: Not on file  . Number of children: 1  . Years of education: Not on file  . Highest education level: Not on file  Occupational History  . Occupation: unemployed  Social Needs  . Financial resource strain: Somewhat hard  . Food insecurity    Worry: Often true    Inability: Often true  . Transportation needs    Medical: Yes    Non-medical: Yes  Tobacco Use  . Smoking status: Former Smoker    Packs/day: 0.20    Years: 15.00    Pack years: 3.00    Types: Cigarettes    Quit date: 11/20/2003    Years since quitting: 15.2  . Smokeless tobacco: Never Used  Substance and Sexual Activity  . Alcohol use: Yes    Alcohol/week: 8.0 standard drinks    Types: 8 Glasses of wine per week  . Drug use: Not Currently    Types: Cocaine  . Sexual activity: Yes    Partners: Male    Birth control/protection: Surgical  Lifestyle  . Physical activity    Days per week: 7 days    Minutes per session: 20 min  . Stress: Very much  Relationships  . Social Herbalist on phone: Never    Gets together: Never    Attends religious service: Never    Active member of club or organization: Yes    Attends meetings of clubs or organizations: Never    Relationship status: Married  Other Topics Concern  . Not on file  Social History Narrative  . Not on file    Additional Social History: The patient was born at and raised in [place], [place]  by her mother and stepfather as she did not know her biological father. She did have a close relationship with her parents and denies any history of any physical or sexual abuse. 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 She has been married for over 69 years and has one 74 year old daughter. She and her husband and daughter live in [place].  Allergies:   Allergies  Allergen Reactions  . Ondansetron Other (See Comments)  Constipation     Metabolic Disorder Labs: Lab Results  Component Value Date   HGBA1C 5.5 07/03/2018   MPG 111.15 07/03/2018   No results found for: PROLACTIN Lab Results  Component Value Date   CHOL 215 (H) 07/03/2018   TRIG 159 (H) 07/03/2018   HDL 63 07/03/2018   CHOLHDL 3.4 07/03/2018   VLDL 32 07/03/2018   LDLCALC 120 (H) 07/03/2018   Lab Results  Component Value Date   TSH 5.041 (H) 07/03/2018    Therapeutic Level Labs: No results found for: LITHIUM No results found for: CBMZ No results found for: VALPROATE  Current Medications: Current Outpatient Medications  Medication Sig Dispense Refill  . amLODipine (NORVASC) 10 MG tablet Take 1 tablet (10 mg total) by mouth at bedtime. 30 tablet 0  . cloNIDine (CATAPRES) 0.1 MG tablet Take 1 tablet (0.1 mg total) by mouth 3 (three) times daily. 90 tablet 0  . ferrous sulfate 325 (65 FE) MG tablet Take 1 tablet (325 mg total) by mouth 2 (two) times daily with a meal. 60 tablet 2  . fluticasone (FLONASE) 50 MCG/ACT nasal spray Place 2 sprays into both nostrils daily. 16 g 5  . hydrALAZINE (APRESOLINE) 50 MG tablet Take 1 tablet (50 mg total) by mouth 3 (three) times daily. 90 tablet 0  . lisinopril (PRINIVIL,ZESTRIL) 20 MG tablet Take 1 tablet (20 mg total) by mouth daily. 30 tablet 2  . lurasidone (LATUDA) 80 MG TABS  tablet Take one tablet with dinner 90 tablet 0  . metoprolol succinate (TOPROL-XL) 50 MG 24 hr tablet Take 1 tablet (50 mg total) by mouth daily. Take with or immediately following a meal. 30 tablet 2  . Multiple Vitamin (MULTIVITAMIN WITH MINERALS) TABS tablet Take 1 tablet by mouth daily. 180 tablet 0  . omeprazole (PRILOSEC) 40 MG capsule Take 1 capsule (40 mg total) by mouth daily. 90 capsule 5  . sertraline (ZOLOFT) 100 MG tablet Take 2.5 tablets (250 mg total) by mouth daily. 225 tablet 0  . simvastatin (ZOCOR) 40 MG tablet Take 40 mg by mouth daily.    . vitamin B-12 (CYANOCOBALAMIN) 500 MCG tablet Take 500 mcg by mouth daily.     No current facility-administered medications for this visit.     Musculoskeletal: Strength & Muscle Tone: unable to assess due to telemed visit Gait & Station: unable to assess due to telemed visit Patient leans: unable to assess due to telemed visit   Psychiatric Specialty Exam: ROS  There were no vitals taken for this visit.There is no height or weight on file to calculate BMI.  General Appearance: Well Groomed  Eye Contact:  Good  Speech:  Clear and Coherent, normal rate  Volume:  Normal  Mood:  Euthymic  Affect:  Congruent  Thought Process:  Goal Directed, Linear and Descriptions of Associations: Intact  Orientation:  Full (Time, Place, and Person)  Thought Content:  Logical  Suicidal Thoughts:  No  Homicidal Thoughts:  No  Memory:  Immediate;   Good Recent;   Good  Judgement:  Fair  Insight:  Fair  Psychomotor Activity:  Normal  Concentration:  Concentration: Good and Attention Span: Good  Recall:  Good  Fund of Knowledge:Good  Language: Good  Akathisia:  No  Handed:  Right  AIMS (if indicated):  not done  Assets:  Communication Skills Desire for Improvement Financial Resources/Insurance Housing Social Support Talents/Skills Transportation  ADL's:  Intact  Cognition: WNL  Sleep:  Good   Screenings: AIMS  Admission  (Discharged) from 07/02/2018 in [facility] Total Score  0    AUDIT     Admission (Discharged) from 07/02/2018 in [facility]  Alcohol Use Disorder Identification Test Final Score (AUDIT)  26    PHQ2-9     Office Visit from 12/02/2018 in [facility] Erroneous Encounter from 08/09/2018 in [facility]  PHQ-2 Total Score  0  1  PHQ-9 Total Score  6  8      Assessment and Plan:  48 y/o female with long standing hx of Bipolar disorder now doing well on current medication combination. She is agreeable to continue same regimen for now.  1. Bipolar I disorder ([facility])  - lurasidone (LATUDA) 80 MG TABS tablet; Take one tablet with dinner  Dispense: 90 tablet; Refill: 0 - sertraline (ZOLOFT) 100 MG tablet; Take 2.5 tablets (250 mg total) by mouth daily.  Dispense: 225 tablet; Refill: 0  2. Alcohol use disorder, moderate, in early remission  F/up in 2 months.  Nevada Crane, MD 10/22/20209:23 AM

## 2019-03-09 ENCOUNTER — Encounter: Payer: BLUE CROSS/BLUE SHIELD | Admitting: Family Medicine

## 2019-04-06 ENCOUNTER — Other Ambulatory Visit: Payer: Self-pay

## 2019-04-06 ENCOUNTER — Encounter: Payer: Self-pay | Admitting: Family Medicine

## 2019-04-06 DIAGNOSIS — F319 Bipolar disorder, unspecified: Secondary | ICD-10-CM

## 2019-04-06 MED ORDER — LISINOPRIL 20 MG PO TABS: 20.0000 mg | ORAL_TABLET | Freq: Every day | ORAL | 1 refills | Status: AC

## 2019-04-06 MED ORDER — CYCLOBENZAPRINE HCL 10 MG PO TABS: 10.0000 mg | ORAL_TABLET | Freq: Every day | ORAL | 3 refills | Status: DC

## 2019-04-06 MED ORDER — METOPROLOL SUCCINATE ER 50 MG PO TB24: 50.0000 mg | ORAL_TABLET | Freq: Every day | ORAL | 1 refills | Status: DC

## 2019-04-06 MED ORDER — HYDRALAZINE HCL 50 MG PO TABS: 50.0000 mg | ORAL_TABLET | Freq: Three times a day (TID) | ORAL | 1 refills | Status: DC

## 2019-04-06 MED ORDER — SIMVASTATIN 40 MG PO TABS: 40.0000 mg | ORAL_TABLET | Freq: Every day | ORAL | 3 refills | Status: DC

## 2019-04-06 MED ORDER — AMLODIPINE BESYLATE 10 MG PO TABS: 10.0000 mg | ORAL_TABLET | Freq: Every day | ORAL | 1 refills | Status: AC

## 2019-04-06 MED ORDER — OMEPRAZOLE 40 MG PO CPDR: 40.0000 mg | DELAYED_RELEASE_CAPSULE | Freq: Every day | ORAL | 1 refills | Status: AC

## 2019-04-06 NOTE — Telephone Encounter (Signed)
Hello, I need several Rx refills to get me thru the next couple months (because I got a letter that you will no longer be in my network after Jan 1st) Amlodipine (this one is already out) Simvastatin (already out) Omeprazole Hydralazine Metoprolol Lisinopril Cyclobenzaprine for arthritis.... I have to also find a new pain clinic for the arthritis  :(  Please call me at [phone] with any questions... please leave a voicemail if I don't answer. 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

## 2019-04-08 ENCOUNTER — Ambulatory Visit (INDEPENDENT_AMBULATORY_CARE_PROVIDER_SITE_OTHER): Payer: BLUE CROSS/BLUE SHIELD | Admitting: Psychiatry

## 2019-04-08 ENCOUNTER — Encounter: Payer: Self-pay | Admitting: Psychiatry

## 2019-04-08 ENCOUNTER — Other Ambulatory Visit: Payer: Self-pay

## 2019-04-08 DIAGNOSIS — F1021 Alcohol dependence, in remission: Secondary | ICD-10-CM | POA: Diagnosis not present

## 2019-04-08 DIAGNOSIS — F3176 Bipolar disorder, in full remission, most recent episode depressed: Secondary | ICD-10-CM | POA: Diagnosis not present

## 2019-04-08 DIAGNOSIS — F319 Bipolar disorder, unspecified: Secondary | ICD-10-CM

## 2019-04-08 MED ORDER — LURASIDONE HCL 80 MG PO TABS: ORAL_TABLET | ORAL | 1 refills | Status: AC

## 2019-04-08 MED ORDER — SERTRALINE HCL 100 MG PO TABS: 250.0000 mg | ORAL_TABLET | Freq: Every day | ORAL | 1 refills | Status: DC

## 2019-04-08 NOTE — Progress Notes (Signed)
[facility] MD OP Progress Note  I connected with  Lisa Vasquez on 04/08/19 by a video enabled telemedicine application and verified that I am speaking with the correct person using two identifiers.   I discussed the limitations of evaluation and management by telemedicine. The patient expressed understanding and agreed to proceed.    04/08/2019 9:01 AM Lisa Vasquez  MRN:  [id number]  Chief Complaint: " I am doing well."  HPI: Patient reported that she has continued to do well on her current medication regimen.  She stated that she is able to sleep well.  She stays busy with babysitting and taking care of her great grandson.  She stated that she had a small get together with close-knit family for Thanksgiving. She denied any issues or concerns at this time. She informed that her insurance is send her a letter stating that [facility] is no longer in her network starting January 1 and now she will have to look for a new psychiatrist.  She stated that she will have to start all over again but she is hopeful that she will find somebody good.  Visit Diagnosis:    ICD-10-CM   1. Bipolar 1 disorder, depressed, full remission ([place])  F31.76   2. Alcohol use disorder, moderate, in early remission ([place])  F10.21     Past Psychiatric History: Bipolar disorder, alcohol use disorder- in remission  Past Medical History:  Past Medical History:  Diagnosis Date  . Anemia   . Anxiety   . Arthritis   . Blood transfusion without reported diagnosis   . Depression   . GERD (gastroesophageal reflux disease)   . Heart murmur   . Hypertension   . Skin cancer     Past Surgical History:  Procedure Laterality Date  . BUNIONECTOMY Right 1998  . CERVICAL ABLATION    . EYE SURGERY    . NASAL SINUS SURGERY  2001  . STRABISMUS SURGERY Right   . WISDOM TOOTH EXTRACTION      Family Psychiatric History: see below  Family History:  Family History  Problem Relation Age of Onset  . COPD Mother   . Skin  cancer Mother   . Heart defect Mother   . Heart attack Maternal Aunt   . Cancer Maternal Aunt   . Heart disease Maternal Aunt   . Heart attack Maternal Uncle   . Cancer Maternal Uncle   . Heart disease Maternal Uncle   . Breast cancer Maternal Grandmother   . Melanoma Maternal Uncle   . Depression Daughter   . Bipolar disorder Daughter   . Colon cancer Neg Hx     Social History:  Social History   Socioeconomic History  . Marital status: Married    Spouse name: Not on file  . Number of children: 1  . Years of education: Not on file  . Highest education level: Not on file  Occupational History  . Occupation: unemployed  Tobacco Use  . Smoking status: Former Smoker    Packs/day: 0.20    Years: 15.00    Pack years: 3.00    Types: Cigarettes    Quit date: 11/20/2003    Years since quitting: 15.3  . Smokeless tobacco: Never Used  Substance and Sexual Activity  . Alcohol use: Yes    Alcohol/week: 8.0 standard drinks    Types: 8 Glasses of wine per week  . Drug use: Not Currently    Types: Cocaine  . Sexual activity: Yes  Partners: Male    Birth control/protection: Surgical  Other Topics Concern  . Not on file  Social History Narrative  . Not on file   Social Determinants of Health   Financial Resource Strain: Medium Risk  . Difficulty of Paying Living Expenses: Somewhat hard  Food Insecurity: Food Insecurity Present  . Worried About Charity fundraiser in the Last Year: Often true  . Ran Out of Food in the Last Year: Often true  Transportation Needs: Unmet Transportation Needs  . Lack of Transportation (Medical): Yes  . Lack of Transportation (Non-Medical): Yes  Physical Activity: Insufficiently Active  . Days of Exercise per Week: 7 days  . Minutes of Exercise per Session: 20 min  Stress: Stress Concern Present  . Feeling of Stress : Very much  Social Connections: Somewhat Isolated  . Frequency of Communication with Friends and Family: Never  . Frequency of  Social Gatherings with Friends and Family: Never  . Attends Religious Services: Never  . Active Member of Clubs or Organizations: Yes  . Attends Archivist Meetings: Never  . Marital Status: Married    Allergies:  Allergies  Allergen Reactions  . Ondansetron Other (See Comments)    Constipation     Metabolic Disorder Labs: Lab Results  Component Value Date   HGBA1C 5.5 07/03/2018   MPG 111.15 07/03/2018   No results found for: PROLACTIN Lab Results  Component Value Date   CHOL 215 (H) 07/03/2018   TRIG 159 (H) 07/03/2018   HDL 63 07/03/2018   CHOLHDL 3.4 07/03/2018   VLDL 32 07/03/2018   LDLCALC 120 (H) 07/03/2018   Lab Results  Component Value Date   TSH 5.041 (H) 07/03/2018    Therapeutic Level Labs: No results found for: LITHIUM No results found for: VALPROATE No components found for:  CBMZ  Current Medications: Current Outpatient Medications  Medication Sig Dispense Refill  . amLODipine (NORVASC) 10 MG tablet Take 1 tablet (10 mg total) by mouth at bedtime. 90 tablet 1  . cloNIDine (CATAPRES) 0.1 MG tablet Take 1 tablet (0.1 mg total) by mouth 3 (three) times daily. 90 tablet 0  . cyclobenzaprine (FLEXERIL) 10 MG tablet Take 1 tablet (10 mg total) by mouth at bedtime. 30 tablet 3  . ferrous sulfate 325 (65 FE) MG tablet Take 1 tablet (325 mg total) by mouth 2 (two) times daily with a meal. 60 tablet 2  . fluticasone (FLONASE) 50 MCG/ACT nasal spray Place 2 sprays into both nostrils daily. 16 g 5  . hydrALAZINE (APRESOLINE) 50 MG tablet Take 1 tablet (50 mg total) by mouth 3 (three) times daily. 90 tablet 1  . lisinopril (ZESTRIL) 20 MG tablet Take 1 tablet (20 mg total) by mouth daily. 90 tablet 1  . lurasidone (LATUDA) 80 MG TABS tablet Take one tablet with dinner 90 tablet 0  . metoprolol succinate (TOPROL-XL) 50 MG 24 hr tablet Take 1 tablet (50 mg total) by mouth daily. Take with or immediately following a meal. 90 tablet 1  . Multiple Vitamin  (MULTIVITAMIN WITH MINERALS) TABS tablet Take 1 tablet by mouth daily. 180 tablet 0  . omeprazole (PRILOSEC) 40 MG capsule Take 1 capsule (40 mg total) by mouth daily. 90 capsule 1  . sertraline (ZOLOFT) 100 MG tablet Take 2.5 tablets (250 mg total) by mouth daily. 225 tablet 0  . simvastatin (ZOCOR) 40 MG tablet Take 1 tablet (40 mg total) by mouth daily. 90 tablet 3  . vitamin B-12 (CYANOCOBALAMIN)  500 MCG tablet Take 500 mcg by mouth daily.     No current facility-administered medications for this visit.     Musculoskeletal: Strength & Muscle Tone: unable to assess due to telemed visit Gait & Station: unable to assess due to telemed visit Patient leans: unable to assess due to telemed visit  Psychiatric Specialty Exam: Review of Systems  There were no vitals taken for this visit.There is no height or weight on file to calculate BMI.  General Appearance: Well Groomed  Eye Contact:  Good  Speech:  Clear and Coherent and Normal Rate  Volume:  Normal  Mood:  Euthymic  Affect:  Congruent  Thought Process:  Goal Directed, Linear and Descriptions of Associations: Intact  Orientation:  Full (Time, Place, and Person)  Thought Content: Logical   Suicidal Thoughts:  No  Homicidal Thoughts:  No  Memory:  Recent;   Good Remote;   Good  Judgement:  Good  Insight:  Good  Psychomotor Activity:  Normal  Concentration:  Concentration: Good and Attention Span: Good  Recall:  Good  Fund of Knowledge: Good  Language: Good  Akathisia:  Negative  Handed:  Right  AIMS (if indicated):not done  Assets:  Communication Skills Desire for Improvement Financial Resources/Insurance Housing Social Support  ADL's:  Intact  Cognition: WNL  Sleep:  Good   Screenings: AIMS     Admission (Discharged) from 07/02/2018 in [facility] Total Score  0    AUDIT     Admission (Discharged) from 07/02/2018 in [facility]  Alcohol Use Disorder  Identification Test Final Score (AUDIT)  26    PHQ2-9     [facility] Visit from 12/02/2018 in [facility] Erroneous Encounter from 08/09/2018 in [facility]  PHQ-2 Total Score  0  1  PHQ-9 Total Score  6  8       Assessment and Plan: Patient is doing well on her current medication regimen.  She will no longer be able to see is due to insurance issues.  She is going to start looking for a new psychiatrist in January.  1. Bipolar 1 disorder, depressed, full remission ([facility])  - lurasidone (LATUDA) 80 MG TABS tablet; Take one tablet with dinner  Dispense: 90 tablet; Refill: 1 - sertraline (ZOLOFT) 100 MG tablet; Take 2.5 tablets (250 mg total) by mouth daily.  Dispense: 225 tablet; Refill: 1  2. Alcohol use disorder, moderate, in early remission ([place])  Continue same medication regimen. Advised to follow up with new psychiatrist in about 3 months.   Nevada Crane, MD 04/08/2019, 9:01 AM

## 2019-04-26 ENCOUNTER — Encounter: Payer: BLUE CROSS/BLUE SHIELD | Admitting: Family Medicine

## 2019-05-17 ENCOUNTER — Other Ambulatory Visit: Payer: Self-pay | Admitting: Family Medicine

## 2019-05-17 DIAGNOSIS — I10 Essential (primary) hypertension: Secondary | ICD-10-CM

## 2019-05-26 ENCOUNTER — Ambulatory Visit: Payer: BLUE CROSS/BLUE SHIELD

## 2019-10-11 ENCOUNTER — Other Ambulatory Visit: Payer: Self-pay | Admitting: Family Medicine

## 2019-10-11 NOTE — Telephone Encounter (Signed)
[facility] faxed refill request for the following medications:  omeprazole (PRILOSEC) 40 MG capsule  Please advise.  Thanks, TGH  

## 2019-11-06 ENCOUNTER — Other Ambulatory Visit: Payer: Self-pay | Admitting: Family Medicine

## 2019-11-25 ENCOUNTER — Other Ambulatory Visit: Admission: RE | Admit: 2019-11-25 | Payer: BC Managed Care – PPO | Source: Home / Self Care | Admitting: *Deleted

## 2020-01-15 ENCOUNTER — Emergency Department
Admission: EM | Admit: 2020-01-15 | Discharge: 2020-01-15 | Disposition: A | Discharge status: Home / Self Care | Payer: BC Managed Care – PPO | Attending: Emergency Medicine | Admitting: Emergency Medicine

## 2020-01-15 ENCOUNTER — Other Ambulatory Visit: Payer: Self-pay

## 2020-01-15 DIAGNOSIS — G2581 Restless legs syndrome: Secondary | ICD-10-CM | POA: Diagnosis not present

## 2020-01-15 DIAGNOSIS — Z87891 Personal history of nicotine dependence: Secondary | ICD-10-CM | POA: Insufficient documentation

## 2020-01-15 DIAGNOSIS — I10 Essential (primary) hypertension: Secondary | ICD-10-CM | POA: Diagnosis not present

## 2020-01-15 DIAGNOSIS — M791 Myalgia, unspecified site: Secondary | ICD-10-CM | POA: Diagnosis present

## 2020-01-15 DIAGNOSIS — Z79899 Other long term (current) drug therapy: Secondary | ICD-10-CM | POA: Insufficient documentation

## 2020-01-15 LAB — COMPREHENSIVE METABOLIC PANEL
ALT: 16 U/L (ref 0–44)
AST: 22 U/L (ref 15–41)
Albumin: 3.6 g/dL (ref 3.5–5.0)
Alkaline Phosphatase: 36 U/L — ABNORMAL LOW (ref 38–126)
Anion gap: 13 (ref 5–15)
BUN: 15 mg/dL (ref 6–20)
CO2: 22 mmol/L (ref 22–32)
Calcium: 8.9 mg/dL (ref 8.9–10.3)
Chloride: 104 mmol/L (ref 98–111)
Creatinine, Ser: 0.98 mg/dL (ref 0.44–1.00)
GFR calc Af Amer: >60 mL/min (ref >60)
GFR calc non Af Amer: >60 mL/min (ref >60)
Glucose, Bld: 92 mg/dL (ref 70–99)
Potassium: 4.2 mmol/L (ref 3.5–5.1)
Sodium: 139 mmol/L (ref 135–145)
Total Bilirubin: 0.5 mg/dL (ref 0.3–1.2)
Total Protein: 6.5 g/dL (ref 6.5–8.1)

## 2020-01-15 LAB — CK: Total CK: 109 U/L (ref 38–234)

## 2020-01-15 LAB — CBC WITH DIFFERENTIAL/PLATELET
Abs Immature Granulocytes: 0.1 10*3/uL — ABNORMAL HIGH (ref 0.00–0.07)
Basophils Absolute: 0.1 10*3/uL (ref 0.0–0.1)
Basophils Relative: 2 %
Eosinophils Absolute: 0.9 10*3/uL — ABNORMAL HIGH (ref 0.0–0.5)
Eosinophils Relative: 10 %
HCT: 39.5 % (ref 36.0–46.0)
Hemoglobin: 12.6 g/dL (ref 12.0–15.0)
Immature Granulocytes: 1 %
Lymphocytes Relative: 22 %
Lymphs Abs: 2.1 10*3/uL (ref 0.7–4.0)
MCH: 29.8 pg (ref 26.0–34.0)
MCHC: 31.9 g/dL (ref 30.0–36.0)
MCV: 93.4 fL (ref 80.0–100.0)
Monocytes Absolute: 0.8 10*3/uL (ref 0.1–1.0)
Monocytes Relative: 8 %
Neutro Abs: 5.6 10*3/uL (ref 1.7–7.7)
Neutrophils Relative %: 57 %
Platelets: 299 10*3/uL (ref 150–400)
RBC: 4.23 MIL/uL (ref 3.87–5.11)
RDW: 13.8 % (ref 11.5–15.5)
WBC: 9.6 10*3/uL (ref 4.0–10.5)
nRBC: 0 % (ref 0.0–0.2)

## 2020-01-15 MED ORDER — DIAZEPAM 2 MG PO TABS
2.0000 mg | ORAL_TABLET | Freq: Once | ORAL | Status: AC
  Administered 2020-01-15: 2 mg via ORAL
  Filled 2020-01-15: qty 1

## 2020-01-15 NOTE — ED Notes (Signed)
Pt AOX4, tearful. See triage note.

## 2020-01-15 NOTE — ED Notes (Signed)
Not for flex

## 2020-01-15 NOTE — ED Notes (Signed)
Pt alert and oriented X 4, stable for discharge. RR even and unlabored, color WNL. Discussed discharge instructions and follow-up as directed. Discharge medications discussed if provided. Pt had opportunity to ask questions if necessary and RN to provide patient/family eduction.  

## 2020-01-15 NOTE — ED Provider Notes (Signed)
Emergency Department Provider Note  ____________________________________________  Time seen: Approximately 10:48 PM  I have reviewed the triage vital signs and the nursing notes.   HISTORY  Chief Complaint Muscle Pain   Historian Patient   HPI Lisa Vasquez is a 49 y.o. female with a history of anxiety, depression, hypertension and GERD, presents to the emergency department with spasms of the bilateral lower extremities.  Patient has been seen and evaluated by her primary care provider and has tried muscle relaxers and gabapentin with no relief.  He states that spasms have kept her from sleeping at night.  She denies fever and chills at home.  No falls or mechanisms of trauma.  She denies numbness or tingling radiating down the lower extremities.  She denies chest pain, chest tightness or abdominal pain.  Patient has been referred by primary care provider to neurology for nerve conduction studies.   Past Medical History:  Diagnosis Date   Anemia    Anxiety    Arthritis    Blood transfusion without reported diagnosis    Depression    GERD (gastroesophageal reflux disease)    Heart murmur    Hypertension    Skin cancer      Immunizations up to date:  Yes.     Past Medical History:  Diagnosis Date   Anemia    Anxiety    Arthritis    Blood transfusion without reported diagnosis    Depression    GERD (gastroesophageal reflux disease)    Heart murmur    Hypertension    Skin cancer     Patient Active Problem List   Diagnosis Date Noted   Alcohol use disorder, moderate, in early remission ([place]) 02/10/2019   History of SCC (squamous cell carcinoma) of skin 12/02/2018   Bipolar I disorder ([place]) 12/02/2018   Anxiety 12/02/2018   Alcohol use disorder, moderate, dependence ([place])    Severe recurrent major depression without psychotic features ([place]) 07/02/2018   History of drug overdose 06/29/2018   HTN (hypertension) 06/29/2018   Iron  deficiency anemia 12/26/2015   Low vitamin B12 level 12/26/2015   Anemia 12/19/2015    Past Surgical History:  Procedure Laterality Date   BUNIONECTOMY Right 1998   CERVICAL ABLATION     EYE SURGERY     NASAL SINUS SURGERY  2001   STRABISMUS SURGERY Right    WISDOM TOOTH EXTRACTION      Prior to Admission medications   Medication Sig Start Date End Date Taking? Authorizing Provider  amLODipine (NORVASC) 10 MG tablet Take 1 tablet (10 mg total) by mouth at bedtime. 04/06/19   Bacigalupo, Dionne Bucy, MD  cloNIDine (CATAPRES) 0.1 MG tablet Take 1 tablet (0.1 mg total) by mouth 3 (three) times daily. 07/09/18   Salary, Avel Peace, MD  cyclobenzaprine (FLEXERIL) 10 MG tablet Take 1 tablet (10 mg total) by mouth at bedtime. 04/06/19   Virginia Crews, MD  ferrous sulfate 325 (65 FE) MG tablet Take 1 tablet (325 mg total) by mouth 2 (two) times daily with a meal. 12/02/18   Bacigalupo, Dionne Bucy, MD  fluticasone (FLONASE) 50 MCG/ACT nasal spray Place 2 sprays into both nostrils daily. 12/02/18   Virginia Crews, MD  hydrALAZINE (APRESOLINE) 50 MG tablet Take 1 tablet (50 mg total) by mouth 3 (three) times daily. 04/06/19   Virginia Crews, MD  lisinopril (ZESTRIL) 20 MG tablet Take 1 tablet (20 mg total) by mouth daily. 04/06/19   Virginia Crews, MD  lurasidone (LATUDA) 80 MG TABS tablet Take one tablet with dinner 04/08/19   Nevada Crane, MD  metoprolol succinate (TOPROL-XL) 50 MG 24 hr tablet Take 1 tablet (50 mg total) by mouth daily. Take with or immediately following a meal. 04/06/19   Bacigalupo, Dionne Bucy, MD  Multiple Vitamin (MULTIVITAMIN WITH MINERALS) TABS tablet Take 1 tablet by mouth daily. 07/09/18   Salary, Avel Peace, MD  omeprazole (PRILOSEC) 40 MG capsule Take 1 capsule (40 mg total) by mouth daily. 04/06/19   Virginia Crews, MD  sertraline (ZOLOFT) 100 MG tablet Take 2.5 tablets (250 mg total) by mouth daily. 04/08/19   Nevada Crane, MD  simvastatin  (ZOCOR) 40 MG tablet Take 1 tablet (40 mg total) by mouth daily. 04/06/19   Virginia Crews, MD  vitamin B-12 (CYANOCOBALAMIN) 500 MCG tablet Take 500 mcg by mouth daily.    [provider]    Allergies Ondansetron  Family History  Problem Relation Age of Onset   COPD Mother    Skin cancer Mother    Heart defect Mother    Heart attack Maternal Aunt    Cancer Maternal Aunt    Heart disease Maternal Aunt    Heart attack Maternal Uncle    Cancer Maternal Uncle    Heart disease Maternal Uncle    Breast cancer Maternal Grandmother    Melanoma Maternal Uncle    Depression Daughter    Bipolar disorder Daughter    Colon cancer Neg Hx     Social History Social History   Tobacco Use   Smoking status: Former Smoker    Packs/day: 0.20    Years: 15.00    Pack years: 3.00    Types: Cigarettes    Quit date: 11/20/2003    Years since quitting: 16.1   Smokeless tobacco: Never Used  Vaping Use   Vaping Use: Never used  Substance Use Topics   Alcohol use: Yes    Alcohol/week: 8.0 standard drinks    Types: 8 Glasses of wine per week   Drug use: Not Currently    Types: Cocaine     Review of Systems  Constitutional: No fever/chills Eyes:  No discharge ENT: No upper respiratory complaints. Respiratory: no cough. No SOB/ use of accessory muscles to breath Gastrointestinal:   No nausea, no vomiting.  No diarrhea.  No constipation. Musculoskeletal: Patient has lower extremity cramping.  Skin: Negative for rash, abrasions, lacerations, ecchymosis.   ____________________________________________   PHYSICAL EXAM:  VITAL SIGNS: ED Triage Vitals  Enc Vitals Group     BP 01/15/20 2035 124/88     Pulse Rate 01/15/20 2035 99     Resp 01/15/20 2035 18     Temp 01/15/20 2035 98.1 F (36.7 C)     Temp src --      SpO2 01/15/20 2035 97 %     Weight 01/15/20 2036 241 lb (109.3 kg)     Height 01/15/20 2036 5\' 6"  (1.676 m)     Head Circumference --       Peak Flow --      Pain Score 01/15/20 2035 10     Pain Loc --      Pain Edu? --      Excl. in [place]? --      Constitutional: Alert and oriented. Well appearing and in no acute distress. Eyes: Conjunctivae are normal. PERRL. EOMI. Head: Atraumatic. Cardiovascular: Normal rate, regular rhythm. Normal S1 and S2.  Good peripheral circulation. Respiratory: Normal respiratory  effort without tachypnea or retractions. Lungs CTAB. Good air entry to the bases with no decreased or absent breath sounds Gastrointestinal: Bowel sounds x 4 quadrants. Soft and nontender to palpation. No guarding or rigidity. No distention. Musculoskeletal: Full range of motion to all extremities. No obvious deformities noted Neurologic:  Normal for age. No gross focal neurologic deficits are appreciated.  Skin:  Skin is warm, dry and intact. No rash noted. Psychiatric: Mood and affect are normal for age. Speech and behavior are normal.   ____________________________________________   LABS (all labs ordered are listed, but only abnormal results are displayed)  Labs Reviewed  CBC WITH DIFFERENTIAL/PLATELET - Abnormal; Notable for the following components:      Result Value   Eosinophils Absolute 0.9 (*)    Abs Immature Granulocytes 0.10 (*)    All other components within normal limits  COMPREHENSIVE METABOLIC PANEL - Abnormal; Notable for the following components:   Alkaline Phosphatase 36 (*)    All other components within normal limits  CK   ____________________________________________  EKG   ____________________________________________  RADIOLOGY   No results found.  ____________________________________________    PROCEDURES  Procedure(s) performed:     Procedures     Medications  diazepam (VALIUM) tablet 2 mg (2 mg Oral Given 01/15/20 2205)     ____________________________________________   INITIAL IMPRESSION / ASSESSMENT AND PLAN / ED COURSE  Pertinent labs & imaging results  that were available during my care of the patient were reviewed by me and considered in my medical decision making (see chart for details).      Assessment and plan Lower extremity spasms 49 year old female presents to the emergency department with bilateral lower extremity spasms.  Vital signs are reassuring at triage.  On physical exam, patient was moving her legs back and forth rapidly but could not hold still when prompted.  I did not notice any involuntary spasms of muscles of the lower extremities.  CBC and CMP were reassuring.  CK was within reference range.  Patient was given Valium in the emergency department which did not relieve her symptoms significantly.  Patient was advised to seek care with neurology for recommendations from primary care.  Return precautions were given to return with new or worsening symptoms.     ____________________________________________  FINAL CLINICAL IMPRESSION(S) / ED DIAGNOSES  Final diagnoses:  Restless leg      NEW MEDICATIONS STARTED DURING THIS VISIT:  ED Discharge Orders    None          This chart was dictated using voice recognition software/Dragon. Despite best efforts to proofread, errors can occur which can change the meaning. Any change was purely unintentional.     Lannie Fields, PA-C 01/15/20 2253    Duffy Bruce, MD 01/20/20 [phone]

## 2020-01-15 NOTE — ED Triage Notes (Signed)
Patient c/o bilateral leg spasms beginning Saturday. Patient prescribed gabapentin and muscle relaxer's by PCP with no relief.

## 2020-01-19 ENCOUNTER — Ambulatory Visit
Admission: EM | Admit: 2020-01-19 | Discharge: 2020-01-19 | Disposition: A | Discharge status: Home / Self Care | Payer: BC Managed Care – PPO | Attending: Emergency Medicine | Admitting: Emergency Medicine

## 2020-01-19 ENCOUNTER — Encounter: Payer: Self-pay | Admitting: Emergency Medicine

## 2020-01-19 ENCOUNTER — Other Ambulatory Visit: Payer: Self-pay

## 2020-01-19 DIAGNOSIS — G2581 Restless legs syndrome: Secondary | ICD-10-CM | POA: Insufficient documentation

## 2020-01-19 DIAGNOSIS — R0789 Other chest pain: Secondary | ICD-10-CM | POA: Insufficient documentation

## 2020-01-19 DIAGNOSIS — F419 Anxiety disorder, unspecified: Secondary | ICD-10-CM | POA: Insufficient documentation

## 2020-01-19 LAB — COMPREHENSIVE METABOLIC PANEL
ALT: 15 U/L (ref 0–44)
AST: 21 U/L (ref 15–41)
Albumin: 3.1 g/dL — ABNORMAL LOW (ref 3.5–5.0)
Alkaline Phosphatase: 26 U/L — ABNORMAL LOW (ref 38–126)
Anion gap: 8 (ref 5–15)
BUN: 14 mg/dL (ref 6–20)
CO2: 25 mmol/L (ref 22–32)
Calcium: 8.1 mg/dL — ABNORMAL LOW (ref 8.9–10.3)
Chloride: 104 mmol/L (ref 98–111)
Creatinine, Ser: 0.88 mg/dL (ref 0.44–1.00)
GFR calc Af Amer: >60 mL/min (ref >60)
GFR calc non Af Amer: >60 mL/min (ref >60)
Glucose, Bld: 104 mg/dL — ABNORMAL HIGH (ref 70–99)
Potassium: 3.8 mmol/L (ref 3.5–5.1)
Sodium: 137 mmol/L (ref 135–145)
Total Bilirubin: 0.6 mg/dL (ref 0.3–1.2)
Total Protein: 5.6 g/dL — ABNORMAL LOW (ref 6.5–8.1)

## 2020-01-19 LAB — CBC WITH DIFFERENTIAL/PLATELET
Abs Immature Granulocytes: 0.05 10*3/uL (ref 0.00–0.07)
Basophils Absolute: 0.1 10*3/uL (ref 0.0–0.1)
Basophils Relative: 1 %
Eosinophils Absolute: 0.4 10*3/uL (ref 0.0–0.5)
Eosinophils Relative: 4 %
HCT: 39.5 % (ref 36.0–46.0)
Hemoglobin: 13 g/dL (ref 12.0–15.0)
Immature Granulocytes: 1 %
Lymphocytes Relative: 10 %
Lymphs Abs: 0.9 10*3/uL (ref 0.7–4.0)
MCH: 30.5 pg (ref 26.0–34.0)
MCHC: 32.9 g/dL (ref 30.0–36.0)
MCV: 92.7 fL (ref 80.0–100.0)
Monocytes Absolute: 0.7 10*3/uL (ref 0.1–1.0)
Monocytes Relative: 8 %
Neutro Abs: 6.7 10*3/uL (ref 1.7–7.7)
Neutrophils Relative %: 76 %
Platelets: 268 10*3/uL (ref 150–400)
RBC: 4.26 MIL/uL (ref 3.87–5.11)
RDW: 14.3 % (ref 11.5–15.5)
WBC: 8.7 10*3/uL (ref 4.0–10.5)
nRBC: 0 % (ref 0.0–0.2)

## 2020-01-19 LAB — TROPONIN I (HIGH SENSITIVITY): Troponin I (High Sensitivity): <2 ng/L (ref <18)

## 2020-01-19 MED ORDER — GABAPENTIN 600 MG PO TABS: 600.0000 mg | ORAL_TABLET | Freq: Every day | ORAL | 0 refills | Status: DC

## 2020-01-19 MED ORDER — HYDROXYZINE HCL 25 MG PO TABS: 50.0000 mg | ORAL_TABLET | Freq: Four times a day (QID) | ORAL | 0 refills | Status: DC | PRN

## 2020-01-19 NOTE — ED Triage Notes (Signed)
Pt c/o chest pain and restless legs. Pain is in the center of her chest. Started about 9 am this morning. She feels like she can not take a deep breath and has been nauseous and having sweats.

## 2020-01-19 NOTE — Discharge Instructions (Signed)
Your EKG and blood work did not show a heart attack and your calculated risk factor is very low.  Your symptoms may be anxiety related and associated with your restless legs impact on your ability to function and sleep.  Try Hydroxyzine 50 mg every 6 hours as needed.  We can try an increased dose of Gabapentin at bedtime for your restless legs. Do not take it with the Hydroxyzine.  Try compression hose to see if they help your restless leg symptoms. You can acquire some lower compression hose at any uniform shop.

## 2020-01-19 NOTE — ED Provider Notes (Signed)
[facility]    CSN: [id number] Arrival date & time: 01/19/20  1205      History   Chief Complaint Chief Complaint  Patient presents with   Chest Pain    HPI ENNIFER Vasquez is a 49 y.o. female.   49 yo female here for evaluation of chest pain.  She reports that her CP started this morning at 0900 and has been steady since. The pain does not radiate. She has had nausea, sweating,and some SOB. She has been dealing with RLS since stopping alcohol 4 months ago. She has been given various medications that have not helped and patient is very agitated and tearful because of the crawling semnsation in her legs that will not stop. She does not get much sleep because she has to pace and keep moving to combat the feeling in her legs. She ash a nerve conduction study coming up on 02/01/20.      Past Medical History:  Diagnosis Date   Anemia    Anxiety    Arthritis    Blood transfusion without reported diagnosis    Depression    GERD (gastroesophageal reflux disease)    Heart murmur    Hypertension    Skin cancer     Patient Active Problem List   Diagnosis Date Noted   Alcohol use disorder, moderate, in early remission ([place]) 02/10/2019   History of SCC (squamous cell carcinoma) of skin 12/02/2018   Bipolar I disorder ([place]) 12/02/2018   Anxiety 12/02/2018   Alcohol use disorder, moderate, dependence ([place])    Severe recurrent major depression without psychotic features ([place]) 07/02/2018   History of drug overdose 06/29/2018   HTN (hypertension) 06/29/2018   Iron deficiency anemia 12/26/2015   Low vitamin B12 level 12/26/2015   Anemia 12/19/2015    Past Surgical History:  Procedure Laterality Date   BUNIONECTOMY Right 1998   CERVICAL ABLATION     EYE SURGERY     NASAL SINUS SURGERY  2001   STRABISMUS SURGERY Right    WISDOM TOOTH EXTRACTION      OB History   No obstetric history on file.      Home Medications    Prior to  Admission medications   Medication Sig Start Date End Date Taking? Authorizing Provider  amLODipine (NORVASC) 10 MG tablet Take 1 tablet (10 mg total) by mouth at bedtime. 04/06/19  Yes Bacigalupo, Dionne Bucy, MD  cyclobenzaprine (FLEXERIL) 10 MG tablet Take 1 tablet (10 mg total) by mouth at bedtime. 04/06/19  Yes Bacigalupo, Dionne Bucy, MD  ferrous sulfate 325 (65 FE) MG tablet Take 1 tablet (325 mg total) by mouth 2 (two) times daily with a meal. 12/02/18  Yes Bacigalupo, Dionne Bucy, MD  fluticasone (FLONASE) 50 MCG/ACT nasal spray Place 2 sprays into both nostrils daily. 12/02/18  Yes Bacigalupo, Dionne Bucy, MD  hydrALAZINE (APRESOLINE) 50 MG tablet Take 1 tablet (50 mg total) by mouth 3 (three) times daily. 04/06/19  Yes Bacigalupo, Dionne Bucy, MD  lisinopril (ZESTRIL) 20 MG tablet Take 1 tablet (20 mg total) by mouth daily. 04/06/19  Yes Virginia Crews, MD  lurasidone (LATUDA) 80 MG TABS tablet Take one tablet with dinner 04/08/19  Yes [place] Crane, MD  metoprolol succinate (TOPROL-XL) 50 MG 24 hr tablet Take 1 tablet (50 mg total) by mouth daily. Take with or immediately following a meal. 04/06/19  Yes Bacigalupo, Dionne Bucy, MD  Multiple Vitamin (MULTIVITAMIN WITH MINERALS) TABS tablet Take 1 tablet by mouth  daily. 07/09/18  Yes Salary, Avel Peace, MD  omeprazole (PRILOSEC) 40 MG capsule Take 1 capsule (40 mg total) by mouth daily. 04/06/19  Yes Bacigalupo, Dionne Bucy, MD  simvastatin (ZOCOR) 40 MG tablet Take 1 tablet (40 mg total) by mouth daily. 04/06/19  Yes Bacigalupo, Dionne Bucy, MD  FLUoxetine (PROZAC) 40 MG capsule Take 40 mg by mouth daily. 11/29/19   [provider]  gabapentin (NEURONTIN) 600 MG tablet Take 1 tablet (600 mg total) by mouth at bedtime. 01/19/20   Margarette Canada, NP  hydrOXYzine (ATARAX/VISTARIL) 25 MG tablet Take 2 tablets (50 mg total) by mouth every 6 (six) hours as needed. 01/19/20   Margarette Canada, NP  pramipexole (MIRAPEX) 0.25 MG tablet Take 0.25 mg by mouth every 2  (two) hours. 01/17/20   [provider]  vitamin B-12 (CYANOCOBALAMIN) 500 MCG tablet Take 500 mcg by mouth daily.    [provider]  cloNIDine (CATAPRES) 0.1 MG tablet Take 1 tablet (0.1 mg total) by mouth 3 (three) times daily. 07/09/18 01/19/20  Salary, Holly Bodily D, MD  sertraline (ZOLOFT) 100 MG tablet Take 2.5 tablets (250 mg total) by mouth daily. 04/08/19 01/19/20  [place] Crane, MD    Family History Family History  Problem Relation Age of Onset   COPD Mother    Skin cancer Mother    Heart defect Mother    Heart attack Maternal Aunt    Cancer Maternal Aunt    Heart disease Maternal Aunt    Heart attack Maternal Uncle    Cancer Maternal Uncle    Heart disease Maternal Uncle    Breast cancer Maternal Grandmother    Melanoma Maternal Uncle    Depression Daughter    Bipolar disorder Daughter    Colon cancer Neg Hx     Social History Social History   Tobacco Use   Smoking status: Former Smoker    Packs/day: 0.20    Years: 15.00    Pack years: 3.00    Types: Cigarettes    Quit date: 11/20/2003    Years since quitting: 16.1   Smokeless tobacco: Never Used  Vaping Use   Vaping Use: Never used  Substance Use Topics   Alcohol use: Yes    Alcohol/week: 8.0 standard drinks    Types: 8 Glasses of wine per week   Drug use: Not Currently    Types: Cocaine     Allergies   Ondansetron   Review of Systems Review of Systems  Constitutional: Positive for diaphoresis. Negative for activity change, appetite change and fever.  HENT: Negative for congestion, rhinorrhea and sore throat.   Respiratory: Positive for shortness of breath. Negative for cough and wheezing.   Cardiovascular: Positive for chest pain. Negative for palpitations and leg swelling.  Gastrointestinal: Positive for nausea. Negative for diarrhea and vomiting.  Genitourinary: Negative for dysuria and frequency.  Musculoskeletal: Negative for arthralgias and myalgias.       A  sensation of things crawling under the skin of her legs.   Skin: Negative.   Neurological: Negative for dizziness, syncope and numbness.  Hematological: Negative.   Psychiatric/Behavioral: Positive for agitation. The patient is nervous/anxious.      Physical Exam Triage Vital Signs ED Triage Vitals  Enc Vitals Group     BP 01/19/20 1219 126/73     Pulse Rate 01/19/20 1219 (!) 104     Resp 01/19/20 1219 20     Temp 01/19/20 1219 98.3 F (36.8 C)     Temp  Source 01/19/20 1219 Oral     SpO2 01/19/20 1219 100 %     Weight 01/19/20 1214 240 lb 15.4 oz (109.3 kg)     Height 01/19/20 1214 5\' 6"  (1.676 m)     Head Circumference --      Peak Flow --      Pain Score 01/19/20 1213 9     Pain Loc --      Pain Edu? --      Excl. in [place]? --    No data found.  Updated Vital Signs BP 126/73 (BP Location: Right Arm)    Pulse (!) 104    Temp 98.3 F (36.8 C) (Oral)    Resp 20    Ht 5\' 6"  (1.676 m)    Wt 240 lb 15.4 oz (109.3 kg)    SpO2 100%    BMI 38.89 kg/m   Visual Acuity Right Eye Distance:   Left Eye Distance:   Bilateral Distance:    Right Eye Near:   Left Eye Near:    Bilateral Near:     Physical Exam Vitals and nursing note reviewed.  Constitutional:      General: She is in acute distress.     Appearance: She is well-developed. She is not ill-appearing, toxic-appearing or diaphoretic.  HENT:     Head: Normocephalic and atraumatic.  Eyes:     Extraocular Movements: Extraocular movements intact.     Pupils: Pupils are equal, round, and reactive to light.  Cardiovascular:     Rate and Rhythm: Normal rate and regular rhythm.     Chest Wall: PMI is not displaced.     Comments: There is a systolic click over the 5th intercostal space mid-clavicular line Pulmonary:     Effort: Pulmonary effort is normal.     Breath sounds: Normal breath sounds.  Musculoskeletal:        General: Normal range of motion.     Cervical back: Normal range of motion and neck supple.     Right  lower leg: No tenderness. No edema.     Left lower leg: Edema present.  Lymphadenopathy:     Cervical: No cervical adenopathy.  Skin:    General: Skin is warm and dry.     Capillary Refill: Capillary refill takes less than 2 seconds.  Neurological:     General: No focal deficit present.     Mental Status: She is alert and oriented to person, place, and time.  Psychiatric:        Mood and Affect: Mood is anxious.        Behavior: Behavior is agitated.      UC Treatments / Results  Labs (all labs ordered are listed, but only abnormal results are displayed) Labs Reviewed  COMPREHENSIVE METABOLIC PANEL - Abnormal; Notable for the following components:      Result Value   Glucose, Bld 104 (*)    Calcium 8.1 (*)    Total Protein 5.6 (*)    Albumin 3.1 (*)    Alkaline Phosphatase 26 (*)    All other components within normal limits  CBC WITH DIFFERENTIAL/PLATELET  TROPONIN I (HIGH SENSITIVITY)    EKG   Radiology No results found.  Procedures Procedures (including critical care time)  Medications Ordered in UC Medications - No data to display  Initial Impression / Assessment and Plan / UC Course  I have reviewed the triage vital signs and the nursing notes.  Pertinent labs & imaging results that  were available during my care of the patient were reviewed by me and considered in my medical decision making (see chart for details).   Patient is here for evaluation of CP that is in the lower middle of her chest and started this AM. There is associated SOB, sweating and nausea that have resolved. The pain is reproducible to palpation of the lower 3rd of the sternum.   The patient is also very tearful and has been experiencing RLS for 4 months since she stopped drinking, she drank 1 bottle of wine daily. Her symptoms have not responded to treatment with low dose gabapentin and ropinirole. She describes a constant feeling of something crawling under her skin.   EKG shows sinus  tach otherwise benign.  Will check blood work.  Cardiac enzymes are negative. HEART score is 3 and patient is at low risk of major cardiac event.  Patient reports she is feeling better and her pain has resolved. She think the issues in her legs triggered a panic attack.  Will D/C with hydroxyzine for anxiety and panic attack abortive therapy. Have her trial Gabapentin 600 mg at bedtime for the restless leg. I also discussed wit the patient that there is some evidence that RLS may be microvascular and that compression hose have been shown to help symptoms in some patients. I suggested she try some compression hose.   Final Clinical Impressions(s) / UC Diagnoses   Final diagnoses:  Atypical chest pain  Anxiety  Restless leg syndrome     Discharge Instructions     Your EKG and blood work did not show a heart attack and your calculated risk factor is very low.  Your symptoms may be anxiety related and associated with your restless legs impact on your ability to function and sleep.  Try Hydroxyzine 50 mg every 6 hours as needed.  We can try an increased dose of Gabapentin at bedtime for your restless legs. Do not take it with the Hydroxyzine.  Try compression hose to see if they help your restless leg symptoms. You can acquire some lower compression hose at any uniform shop.     ED Prescriptions    Medication Sig Dispense Auth. Provider   hydrOXYzine (ATARAX/VISTARIL) 25 MG tablet Take 2 tablets (50 mg total) by mouth every 6 (six) hours as needed. 30 tablet Margarette Canada, NP   gabapentin (NEURONTIN) 600 MG tablet Take 1 tablet (600 mg total) by mouth at bedtime. 30 tablet Margarette Canada, NP     PDMP not reviewed this encounter.   Margarette Canada, NP 01/19/20 1426

## 2020-08-27 ENCOUNTER — Other Ambulatory Visit (HOSPITAL_COMMUNITY): Payer: Self-pay | Admitting: Family Medicine

## 2020-08-27 ENCOUNTER — Other Ambulatory Visit: Payer: Self-pay | Admitting: Family Medicine

## 2020-08-27 DIAGNOSIS — G609 Hereditary and idiopathic neuropathy, unspecified: Secondary | ICD-10-CM

## 2020-08-27 DIAGNOSIS — N39490 Overflow incontinence: Secondary | ICD-10-CM

## 2020-08-27 DIAGNOSIS — R600 Localized edema: Secondary | ICD-10-CM

## 2020-09-04 ENCOUNTER — Ambulatory Visit: Payer: BC Managed Care – PPO

## 2020-09-04 ENCOUNTER — Ambulatory Visit: Admission: RE | Admit: 2020-09-04 | Payer: BC Managed Care – PPO | Source: Ambulatory Visit

## 2020-09-12 ENCOUNTER — Ambulatory Visit
Admission: RE | Admit: 2020-09-12 | Discharge: 2020-09-12 | Disposition: A | Discharge status: Home / Self Care | Payer: BC Managed Care – PPO | Source: Ambulatory Visit | Attending: Family Medicine | Admitting: Family Medicine

## 2020-09-12 ENCOUNTER — Other Ambulatory Visit: Payer: Self-pay

## 2020-09-12 DIAGNOSIS — G609 Hereditary and idiopathic neuropathy, unspecified: Secondary | ICD-10-CM | POA: Diagnosis present

## 2020-09-12 DIAGNOSIS — N39490 Overflow incontinence: Secondary | ICD-10-CM | POA: Insufficient documentation

## 2020-09-12 DIAGNOSIS — R600 Localized edema: Secondary | ICD-10-CM

## 2020-09-12 MED ORDER — GADOBUTROL 1 MMOL/ML IV SOLN
10.0000 mL | Freq: Once | INTRAVENOUS | Status: AC | PRN
  Administered 2020-09-12: 7.5 mL via INTRAVENOUS

## 2020-10-17 IMAGING — DX PORTABLE ABDOMEN - 1 VIEW
1 series · 1 of 1 positions shown · non-contrast
Comparison: Prior radiograph from earlier same day.

CLINICAL DATA: Initial evaluation for OG tube placement.

EXAM:
PORTABLE ABDOMEN - 1 VIEW

[abdomen supine]
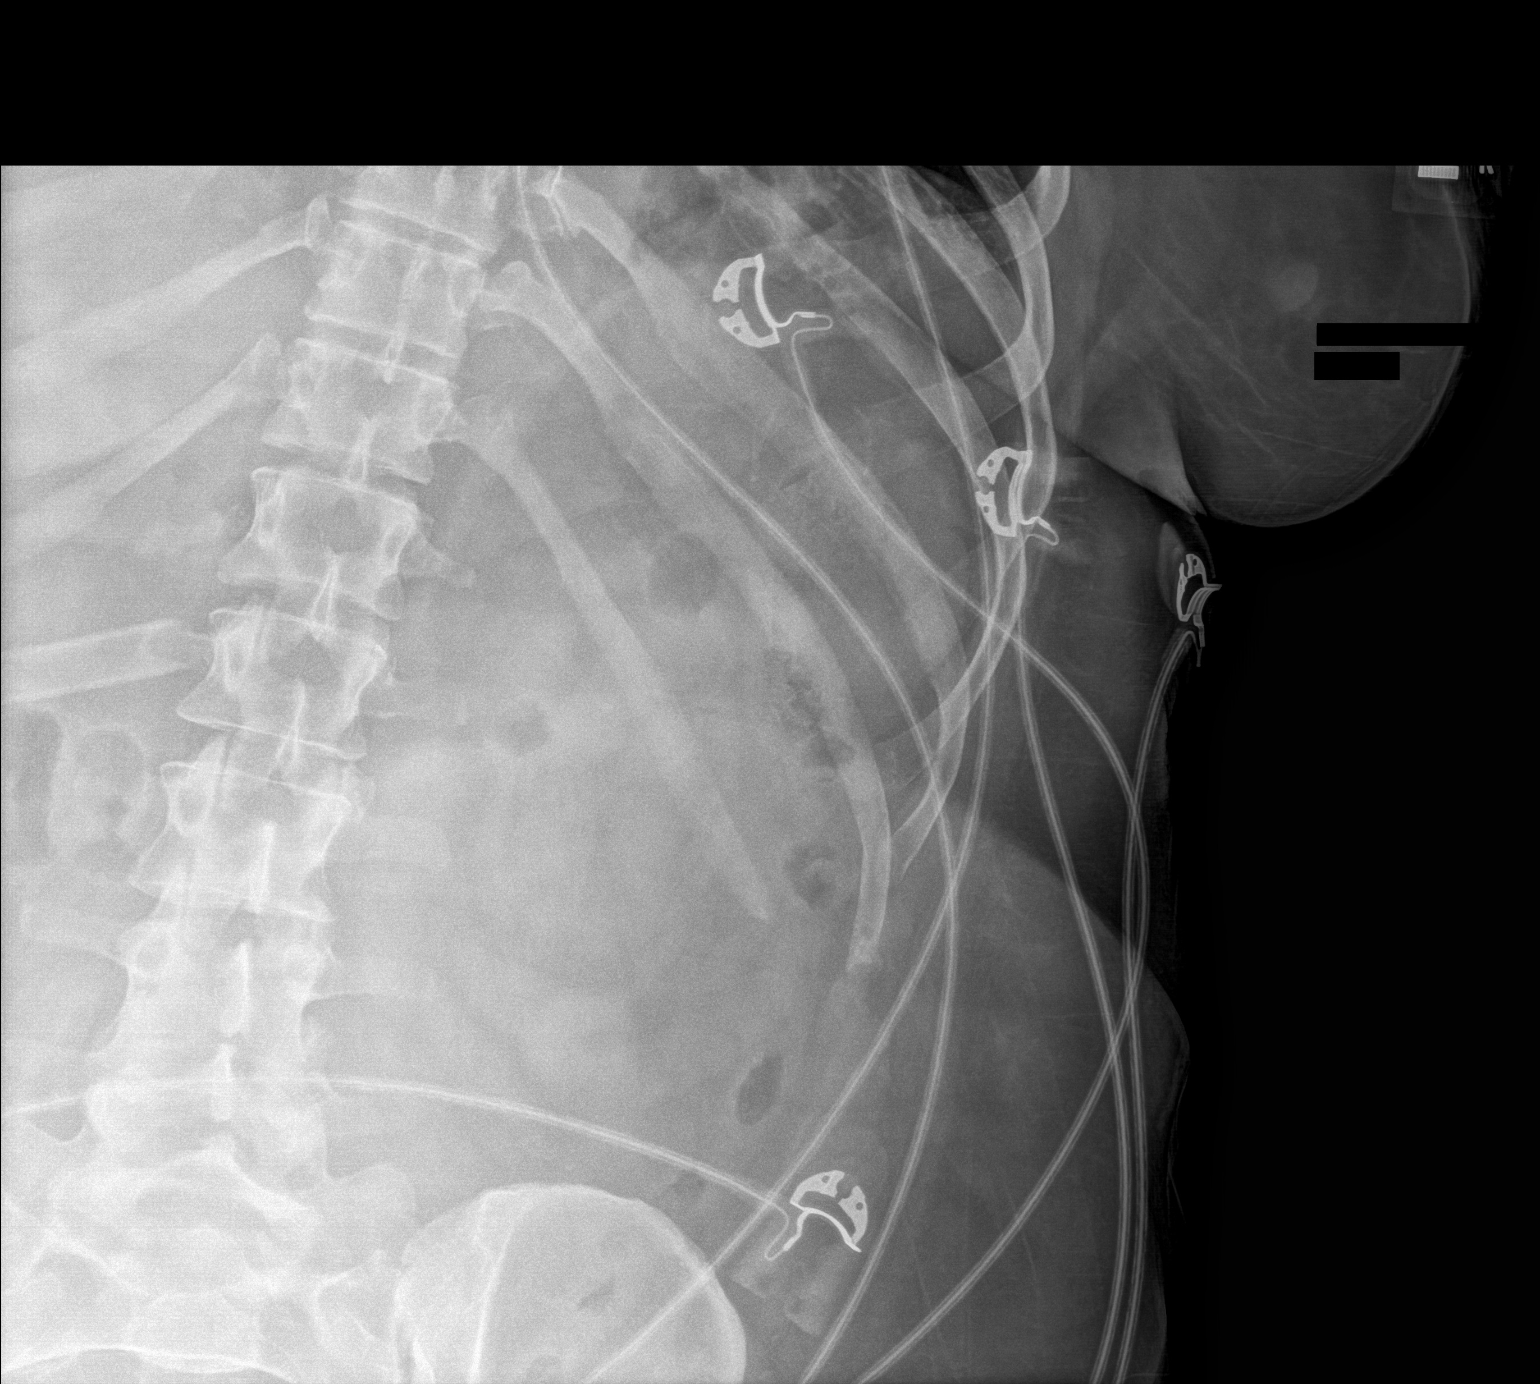

[1 of 1 positions shown; findings below may reference images not displayed]

FINDINGS: Distal aspect of a probable enteric tube seen overlying the GE
junction. Side hole not visualized, likely in the esophagus.

Paucity of gas limits evaluation of the bowels.
IMPRESSION: Tip of probable enteric tube overlying the GE junction. Side hole
not visualize, likely in the distal esophagus.

## 2020-10-17 IMAGING — DX PORTABLE CHEST - 1 VIEW
1 series · 1 of 1 positions shown · non-contrast
Comparison: 05/31/2011

CLINICAL DATA: Intubated

EXAM:
PORTABLE CHEST 1 VIEW

[chest ap]
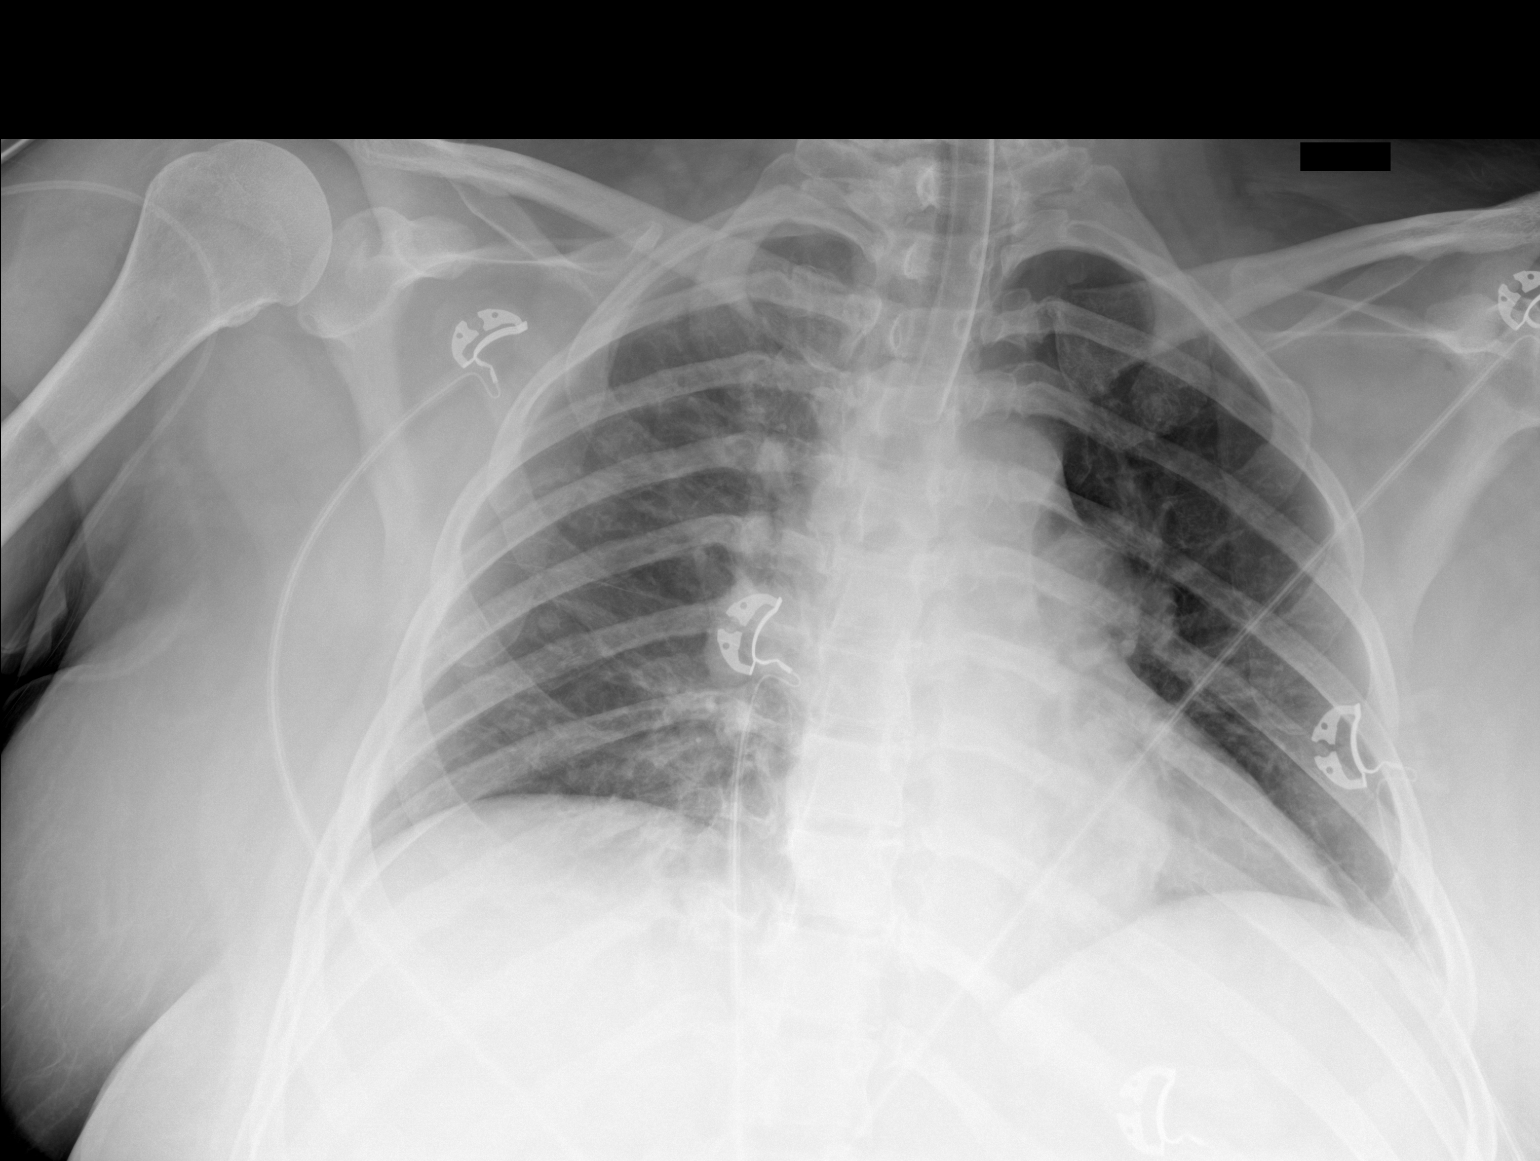

[1 of 1 positions shown; findings below may reference images not displayed]

FINDINGS: Endotracheal tube tip is about 2 cm superior to the carina. No acute
consolidation or pleural effusion. Stable cardiomediastinal
silhouette with prominence of the left greater than right hila.
Retrocardiac opacity, likely due to moderate hiatal hernia. No
pneumothorax
IMPRESSION: 1. Endotracheal tube tip about 2 cm superior to carina
2. No acute airspace disease

## 2020-10-17 IMAGING — DX PORTABLE ABDOMEN - 1 VIEW
1 series · 1 of 1 positions shown · non-contrast
Comparison: None.

CLINICAL DATA: OG tube placement

EXAM:
PORTABLE ABDOMEN - 1 VIEW

[abdomen supine]
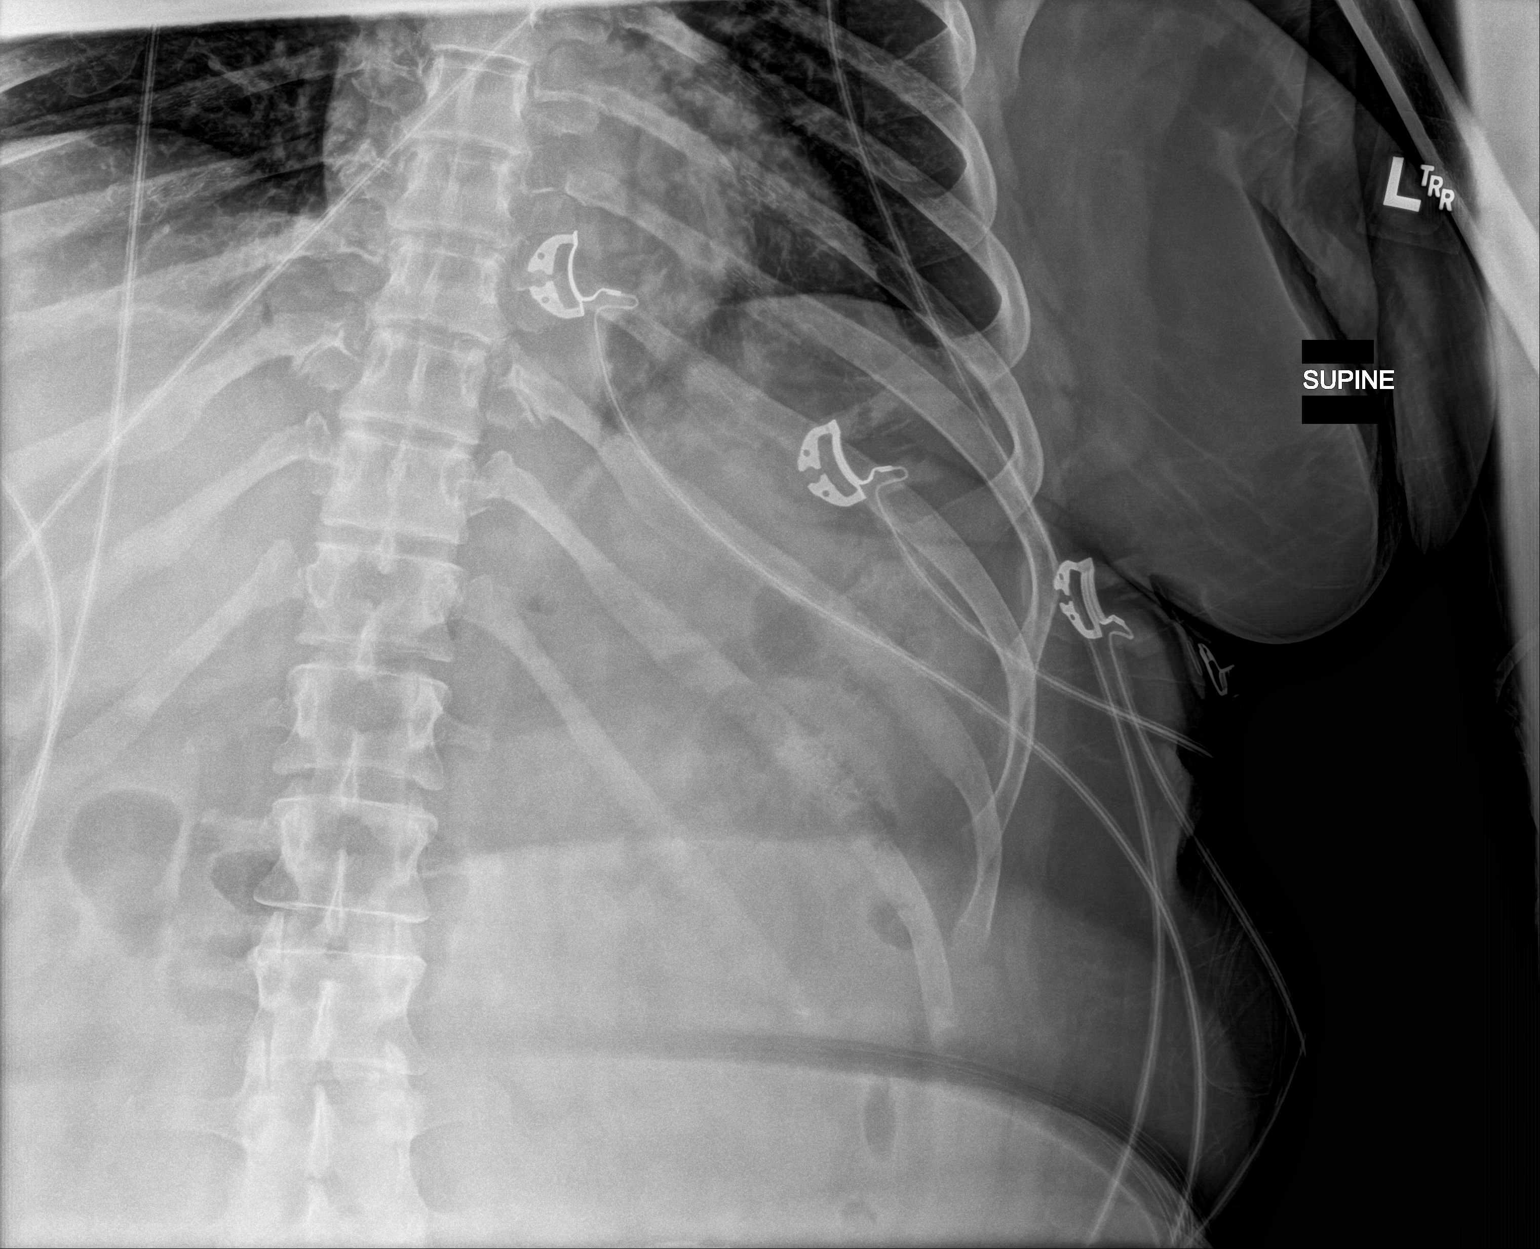

[1 of 1 positions shown; findings below may reference images not displayed]

FINDINGS: No radiopaque tubing visible over the lower mediastinum or left
upper quadrant of the abdomen.
IMPRESSION: No esophageal tube visualized over the lower chest or left upper
quadrant.

## 2020-10-18 IMAGING — DX PORTABLE CHEST - 1 VIEW
1 series · 1 of 1 positions shown · non-contrast
Comparison: June 29, 2018

CLINICAL DATA: Hypoxia

EXAM:
PORTABLE CHEST 1 VIEW

[chest ap]
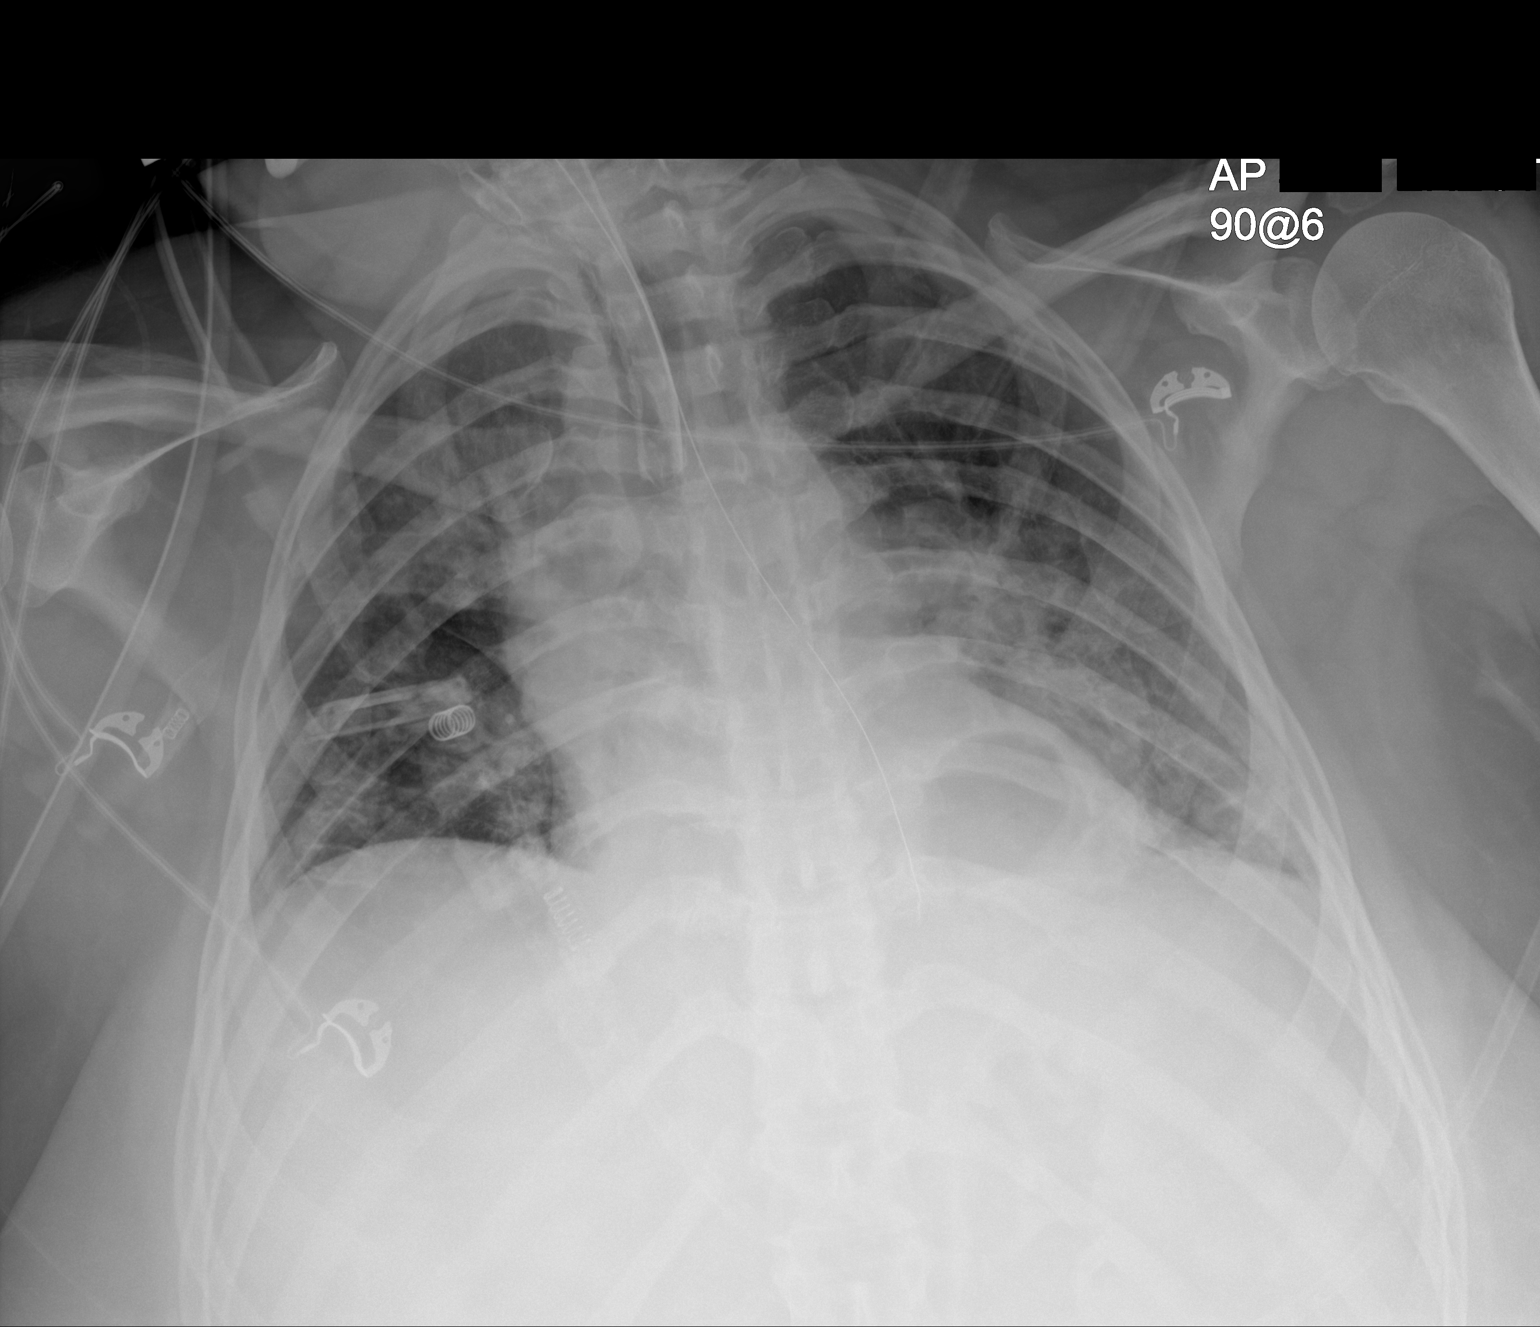

[1 of 1 positions shown; findings below may reference images not displayed]

FINDINGS: Endotracheal tube tip is 2.5 cm above the carina. Nasogastric tube
tip is at the gastroesophageal junction, apparently within a hiatal
type hernia. No pneumothorax.

There is atelectatic change in both lung bases. There is no frank
consolidation or edema. Heart is upper normal in size with pulmonary
vascularity normal. No adenopathy. No bone lesions.
IMPRESSION: Tube and catheter positions as described without pneumothorax. Note
that the endotracheal tube tip is at the gastroesophageal junction,
likely with an apparent hiatal hernia. The side port is above the
gastroesophageal junction. Advise advancing nasogastric tube
approximately 10 cm.

Bibasilar atelectasis. No frank edema or consolidation. Stable
cardiac silhouette.

## 2020-10-19 IMAGING — DX PORTABLE CHEST - 1 VIEW
1 series · 1 of 1 positions shown · non-contrast
Comparison: June 30, 2018

CLINICAL DATA: Respiratory failure

EXAM:
PORTABLE CHEST 1 VIEW

[chest ap]
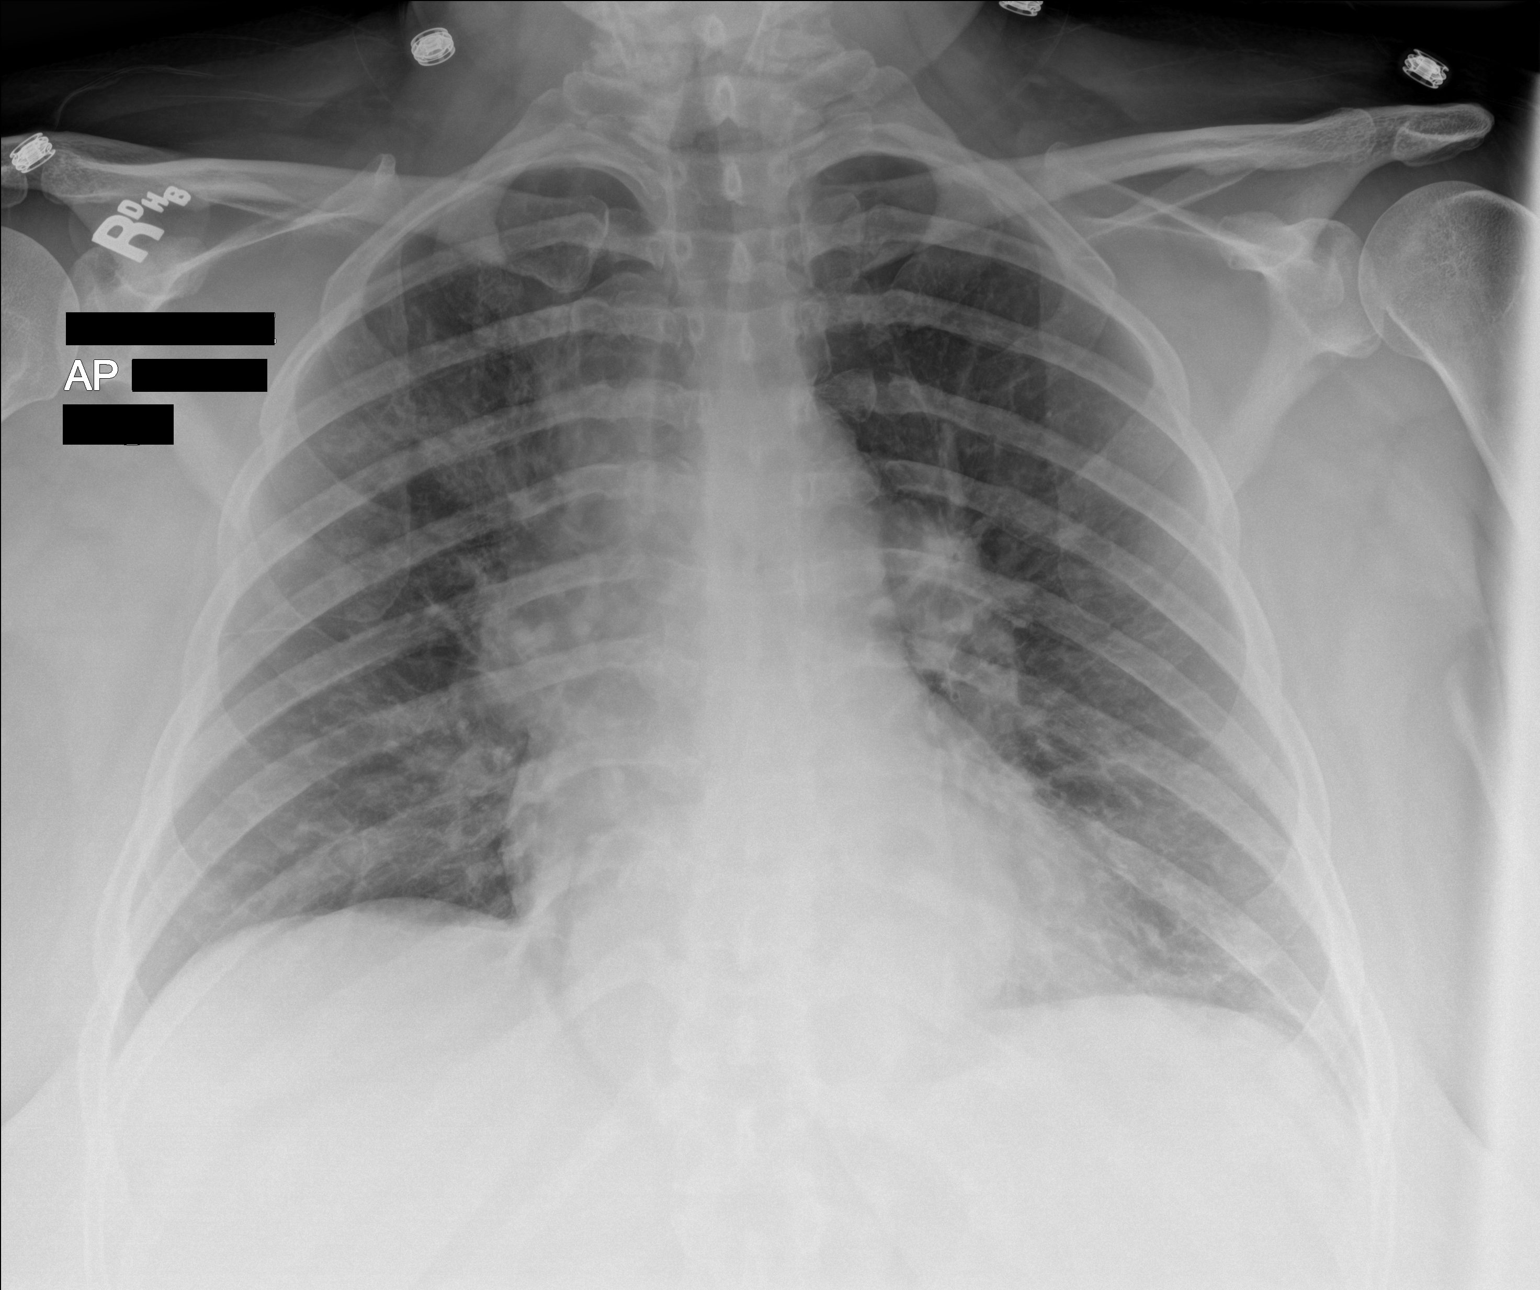

[1 of 1 positions shown; findings below may reference images not displayed]

FINDINGS: Endotracheal tube and nasogastric tube have been removed. No
pneumothorax. There is no edema or consolidation. Heart size and
pulmonary vascularity are normal. There is a moderate hiatal type
hernia. No appreciable adenopathy. No bone lesions.
IMPRESSION: Moderate hiatal hernia. No edema or consolidation. Stable cardiac
silhouette. No pneumothorax.

## 2021-02-11 ENCOUNTER — Ambulatory Visit (INDEPENDENT_AMBULATORY_CARE_PROVIDER_SITE_OTHER): Payer: BC Managed Care – PPO

## 2021-02-11 ENCOUNTER — Encounter: Payer: Self-pay | Admitting: Licensed Clinical Social Worker

## 2021-02-11 ENCOUNTER — Other Ambulatory Visit: Payer: Self-pay

## 2021-02-11 ENCOUNTER — Ambulatory Visit
Admission: EM | Admit: 2021-02-11 | Discharge: 2021-02-11 | Disposition: A | Discharge status: Home / Self Care | Payer: BC Managed Care – PPO | Attending: Internal Medicine | Admitting: Internal Medicine

## 2021-02-11 DIAGNOSIS — Z20822 Contact with and (suspected) exposure to covid-19: Secondary | ICD-10-CM | POA: Diagnosis not present

## 2021-02-11 DIAGNOSIS — J069 Acute upper respiratory infection, unspecified: Secondary | ICD-10-CM | POA: Diagnosis not present

## 2021-02-11 DIAGNOSIS — R062 Wheezing: Secondary | ICD-10-CM

## 2021-02-11 DIAGNOSIS — J40 Bronchitis, not specified as acute or chronic: Secondary | ICD-10-CM | POA: Diagnosis not present

## 2021-02-11 DIAGNOSIS — R059 Cough, unspecified: Secondary | ICD-10-CM

## 2021-02-11 DIAGNOSIS — R509 Fever, unspecified: Secondary | ICD-10-CM

## 2021-02-11 LAB — RESP PANEL BY RT-PCR (FLU A&B, COVID) ARPGX2
Influenza A by PCR: NEGATIVE
Influenza B by PCR: NEGATIVE
SARS Coronavirus 2 by RT PCR: NEGATIVE

## 2021-02-11 MED ORDER — ALBUTEROL SULFATE HFA 108 (90 BASE) MCG/ACT IN AERS: 2.0000 | INHALATION_SPRAY | RESPIRATORY_TRACT | 0 refills | Status: DC | PRN

## 2021-02-11 NOTE — ED Provider Notes (Signed)
[facility]    CSN: [id number] Arrival date & time: 02/11/21  0802      History   Chief Complaint Chief Complaint  Patient presents with   Cough   Fever   Nasal Congestion    HPI Lisa Vasquez is a 50 y.o. female who presents with onset of cough, chest congestion, diarrhea and fever of 101 yesterday.  Has been taking Micinex and Robitussin. Has one diarrhea episode and x 2 this am. Admits of having HA, fatigue, loss of appetite. Has chest pain with coughing on central substernal region and has been feeling SOB.  She is not a smoker and does not have hx of asthma. Has had covid shots and her flu shot this year.     Past Medical History:  Diagnosis Date   Anemia    Anxiety    Arthritis    Blood transfusion without reported diagnosis    Depression    GERD (gastroesophageal reflux disease)    Heart murmur    Hypertension    Skin cancer     Patient Active Problem List   Diagnosis Date Noted   Alcohol use disorder, moderate, in early remission ([place]) 02/10/2019   History of SCC (squamous cell carcinoma) of skin 12/02/2018   Bipolar I disorder ([place]) 12/02/2018   Anxiety 12/02/2018   Alcohol use disorder, moderate, dependence ([place])    Severe recurrent major depression without psychotic features ([place]) 07/02/2018   History of drug overdose 06/29/2018   HTN (hypertension) 06/29/2018   Iron deficiency anemia 12/26/2015   Low vitamin B12 level 12/26/2015   Anemia 12/19/2015    Past Surgical History:  Procedure Laterality Date   BUNIONECTOMY Right 1998   CERVICAL ABLATION     EYE SURGERY     NASAL SINUS SURGERY  2001   STRABISMUS SURGERY Right    WISDOM TOOTH EXTRACTION      OB History   No obstetric history on file.      Home Medications    Prior to Admission medications   Medication Sig Start Date End Date Taking? Authorizing Provider  albuterol (VENTOLIN HFA) 108 (90 Base) MCG/ACT inhaler Inhale 2 puffs into the lungs every 4 (four) hours as  needed for wheezing or shortness of breath. 02/11/21  Yes Rodriguez-Southworth, Sunday Spillers, PA-C  amLODipine (NORVASC) 10 MG tablet Take 1 tablet (10 mg total) by mouth at bedtime. 04/06/19  Yes Bacigalupo, Dionne Bucy, MD  ferrous sulfate 325 (65 FE) MG tablet Take 1 tablet (325 mg total) by mouth 2 (two) times daily with a meal. 12/02/18  Yes Bacigalupo, Dionne Bucy, MD  FLUoxetine (PROZAC) 40 MG capsule Take 40 mg by mouth daily. 11/29/19  Yes [provider]  fluticasone (FLONASE) 50 MCG/ACT nasal spray Place 2 sprays into both nostrils daily. 12/02/18  Yes Bacigalupo, Dionne Bucy, MD  hydrALAZINE (APRESOLINE) 50 MG tablet Take 1 tablet (50 mg total) by mouth 3 (three) times daily. 04/06/19  Yes Bacigalupo, Dionne Bucy, MD  lisinopril (ZESTRIL) 20 MG tablet Take 1 tablet (20 mg total) by mouth daily. 04/06/19  Yes Virginia Crews, MD  lurasidone (LATUDA) 80 MG TABS tablet Take one tablet with dinner 04/08/19  Yes [place] Crane, MD  metoprolol succinate (TOPROL-XL) 50 MG 24 hr tablet Take 1 tablet (50 mg total) by mouth daily. Take with or immediately following a meal. 04/06/19  Yes Bacigalupo, Dionne Bucy, MD  Multiple Vitamin (MULTIVITAMIN WITH MINERALS) TABS tablet Take 1 tablet by mouth daily. 07/09/18  Yes Salary, Avel Peace, MD  omeprazole (PRILOSEC) 40 MG capsule Take 1 capsule (40 mg total) by mouth daily. 04/06/19  Yes Bacigalupo, Dionne Bucy, MD  pramipexole (MIRAPEX) 0.25 MG tablet Take 0.25 mg by mouth every 2 (two) hours. 01/17/20  Yes [provider]  cloNIDine (CATAPRES) 0.1 MG tablet Take 1 tablet (0.1 mg total) by mouth 3 (three) times daily. 07/09/18 01/19/20  Salary, Holly Bodily D, MD  sertraline (ZOLOFT) 100 MG tablet Take 2.5 tablets (250 mg total) by mouth daily. 04/08/19 01/19/20  [place] Crane, MD    Family History Family History  Problem Relation Age of Onset   COPD Mother    Skin cancer Mother    Heart defect Mother    Heart attack Maternal Aunt    Cancer Maternal Aunt     Heart disease Maternal Aunt    Heart attack Maternal Uncle    Cancer Maternal Uncle    Heart disease Maternal Uncle    Breast cancer Maternal Grandmother    Melanoma Maternal Uncle    Depression Daughter    Bipolar disorder Daughter    Colon cancer Neg Hx     Social History Social History   Tobacco Use   Smoking status: Former    Packs/day: 0.20    Years: 15.00    Pack years: 3.00    Types: Cigarettes    Quit date: 11/20/2003    Years since quitting: 17.2   Smokeless tobacco: Never  Vaping Use   Vaping Use: Never used  Substance Use Topics   Alcohol use: Yes    Alcohol/week: 8.0 standard drinks    Types: 8 Glasses of wine per week   Drug use: Not Currently    Types: Cocaine, Marijuana     Allergies   Ondansetron   Review of Systems Review of Systems  Constitutional:  Positive for appetite change, chills, fatigue and fever. Negative for activity change.  HENT:  Positive for congestion and postnasal drip. Negative for ear discharge, ear pain, sore throat and trouble swallowing.   Eyes:  Negative for discharge.  Respiratory:  Positive for cough, shortness of breath and wheezing. Negative for chest tightness.   Cardiovascular:  Negative for chest pain.  Gastrointestinal:  Positive for diarrhea. Negative for abdominal pain, nausea and vomiting.  Musculoskeletal:  Positive for myalgias.  Skin:  Negative for rash.  Neurological:  Positive for headaches.  Hematological:  Negative for adenopathy.    Physical Exam Triage Vital Signs ED Triage Vitals  Enc Vitals Group     BP 02/11/21 0821 (!) 154/87     Pulse Rate 02/11/21 0821 64     Resp 02/11/21 0821 16     Temp 02/11/21 0821 98.5 F (36.9 C)     Temp Source 02/11/21 0821 Oral     SpO2 02/11/21 0821 97 %     Weight 02/11/21 0816 220 lb (99.8 kg)     Height 02/11/21 0816 5\' 5"  (1.651 m)     Head Circumference --      Peak Flow --      Pain Score 02/11/21 0816 0     Pain Loc --      Pain Edu? --      Excl. in  [place]? --    No data found.  Updated Vital Signs BP (!) 154/87 (BP Location: Left Arm)   Pulse 64   Temp 98.5 F (36.9 C) (Oral)   Resp 16   Ht 5\' 5"  (1.651 m)  Wt 220 lb (99.8 kg)   SpO2 97%   BMI 36.61 kg/m   Visual Acuity Right Eye Distance:   Left Eye Distance:   Bilateral Distance:    Right Eye Near:   Left Eye Near:    Bilateral Near:     Physical Exam Physical Exam Vitals signs and nursing note reviewed.  Constitutional:      General: She is not in acute distress.    Appearance: Normal appearance. She is looks ill-appearing, but  toxic-appearing or diaphoretic.  HENT:     Head: Normocephalic.     Right Ear: Tympanic membrane, ear canal and external ear normal.     Left Ear: Tympanic membrane, ear canal and external ear normal.     Nose: Nose normal.     Mouth/Throat:     Mouth: Mucous membranes are moist.  Eyes:     General: No scleral icterus.       Right eye: No discharge.        Left eye: No discharge.     Conjunctiva/sclera: Conjunctivae normal.  Neck:     Musculoskeletal: Neck supple. No neck rigidity.  Cardiovascular:     Rate and Rhythm: Normal rate and regular rhythm.     Heart sounds: No murmur.  Pulmonary:     Effort: Pulmonary effort is normal.     Breath sounds: with wheezing throughout    Musculoskeletal: Normal range of motion.  Lymphadenopathy:     Cervical: No cervical adenopathy.  Skin:    General: Skin is warm and dry.     Coloration: Skin is not jaundiced.     Findings: No rash.  Neurological:     Mental Status: She is alert and oriented to person, place, and time.     Gait: Gait normal.  Psychiatric:        Mood and Affect: Mood normal.        Behavior: Behavior normal.        Thought Content: Thought content normal.        Judgment: Judgment normal.    UC Treatments / Results  Labs (all labs ordered are listed, but only abnormal results are displayed) Labs Reviewed  RESP PANEL BY RT-PCR (FLU A&B, COVID) [id number]   COVID-19, FLU A+B NAA  Covid and Flu test are neg   EKG   Radiology DG Chest 2 View  Result Date: 02/11/2021 CLINICAL DATA:  wheezing, fever and cough EXAM: CHEST - 2 VIEW COMPARISON:  None. FINDINGS: The heart size and mediastinal contours are within normal limits. Both lungs are clear. No pleural effusion or pneumothorax. The visualized skeletal structures are unremarkable. IMPRESSION: No acute process in the chest. Electronically Signed   By: Macy Mis M.D.   On: 02/11/2021 09:15    Procedures Procedures (including critical care time)  Medications Ordered in UC Medications - No data to display  Initial Impression / Assessment and Plan / UC Course  I have reviewed the triage vital signs and the nursing notes. Pertinent labs & imaging results that were available during my care of the patient were reviewed by me and considered in my medical decision making (see chart for details). Has viral bronchitis  I placed her on Albuterol inhaler May take Mucinex or Robitusin as needed.  Needs to Fu with PCP for lung sound recheck this week.     Final Clinical Impressions(s) / UC Diagnoses   Final diagnoses:  Bronchitis  Upper respiratory tract infection, unspecified type  Discharge Instructions      Your chest xray is normal. I suspect you have viral bronchitis for which antibiotics do not help. But could be from flu, if that is what you may have.  Use your flonase to help with congestion You may also do saline nose rinses. The covid and flu test wont be ready for a couple of more hours     ED Prescriptions     Medication Sig Dispense Auth. Provider   albuterol (VENTOLIN HFA) 108 (90 Base) MCG/ACT inhaler Inhale 2 puffs into the lungs every 4 (four) hours as needed for wheezing or shortness of breath. 18 g Rodriguez-Southworth, Sunday Spillers, PA-C      PDMP not reviewed this encounter.   Shelby Mattocks, PA-C 02/11/21 1345

## 2021-02-11 NOTE — ED Triage Notes (Signed)
Pt c/o cough, chest congestion, fever, and diarrhea x 2 days. Tried mucinex and robitussion,

## 2021-02-11 NOTE — Discharge Instructions (Addendum)
Your chest xray is normal. I suspect you have viral bronchitis for which antibiotics do not help. But could be from flu, if that is what you may have.  Use your flonase to help with congestion You may also do saline nose rinses. The covid and flu test wont be ready for a couple of more hours

## 2021-05-26 ENCOUNTER — Other Ambulatory Visit: Payer: Self-pay

## 2021-05-26 ENCOUNTER — Ambulatory Visit
Admission: EM | Admit: 2021-05-26 | Discharge: 2021-05-26 | Disposition: A | Discharge status: Home / Self Care | Payer: BC Managed Care – PPO | Attending: Internal Medicine | Admitting: Internal Medicine

## 2021-05-26 DIAGNOSIS — J0100 Acute maxillary sinusitis, unspecified: Secondary | ICD-10-CM | POA: Diagnosis not present

## 2021-05-26 MED ORDER — FLUCONAZOLE 150 MG PO TABS: 150.0000 mg | ORAL_TABLET | Freq: Every day | ORAL | 0 refills | Status: DC

## 2021-05-26 MED ORDER — AMOXICILLIN-POT CLAVULANATE 875-125 MG PO TABS: 1.0000 | ORAL_TABLET | Freq: Two times a day (BID) | ORAL | 0 refills | Status: DC

## 2021-05-26 MED ORDER — HYDROCOD POLI-CHLORPHE POLI ER 10-8 MG/5ML PO SUER: 5.0000 mL | Freq: Every evening | ORAL | 0 refills | Status: DC | PRN

## 2021-05-26 NOTE — ED Provider Notes (Signed)
[facility]    CSN: [id number] Arrival date & time: 05/26/21  0802      History   Chief Complaint Chief Complaint  Patient presents with   Cough   Nasal Congestion    HPI Lisa Vasquez is a 51 y.o. female who presents with  a couple of complaints Has had URI that started 2 weeks ago and continues havins cough and sinus congestion with green mucous. Since last night developed a fever of 100.9 and body aches. Has had prior sinus surgeries. Has not been doing saline sinus rinses.      Past Medical History:  Diagnosis Date   Anemia    Anxiety    Arthritis    Blood transfusion without reported diagnosis    Depression    GERD (gastroesophageal reflux disease)    Heart murmur    Hypertension    Skin cancer     Patient Active Problem List   Diagnosis Date Noted   Alcohol use disorder, moderate, in early remission ([place]) 02/10/2019   History of SCC (squamous cell carcinoma) of skin 12/02/2018   Bipolar I disorder ([place]) 12/02/2018   Anxiety 12/02/2018   Alcohol use disorder, moderate, dependence ([place])    Severe recurrent major depression without psychotic features ([place]) 07/02/2018   History of drug overdose 06/29/2018   HTN (hypertension) 06/29/2018   Iron deficiency anemia 12/26/2015   Low vitamin B12 level 12/26/2015   Anemia 12/19/2015    Past Surgical History:  Procedure Laterality Date   BUNIONECTOMY Right 1998   CERVICAL ABLATION     EYE SURGERY     NASAL SINUS SURGERY  2001   STRABISMUS SURGERY Right    WISDOM TOOTH EXTRACTION      OB History   No obstetric history on file.      Home Medications    Prior to Admission medications   Medication Sig Start Date End Date Taking? Authorizing Provider  albuterol (VENTOLIN HFA) 108 (90 Base) MCG/ACT inhaler Inhale 2 puffs into the lungs every 4 (four) hours as needed for wheezing or shortness of breath. 02/11/21  Yes Rodriguez-Southworth, Sunday Spillers, PA-C  amLODipine (NORVASC) 10 MG tablet Take 1  tablet (10 mg total) by mouth at bedtime. 04/06/19  Yes Bacigalupo, Dionne Bucy, MD  amoxicillin-clavulanate (AUGMENTIN) 875-125 MG tablet Take 1 tablet by mouth every 12 (twelve) hours. 05/26/21  Yes Rodriguez-Southworth, Sunday Spillers, PA-C  chlorpheniramine-HYDROcodone (TUSSIONEX PENNKINETIC ER) 10-8 MG/5ML Take 5 mLs by mouth at bedtime as needed for cough. 05/26/21  Yes Rodriguez-Southworth, Sunday Spillers, PA-C  ferrous sulfate 325 (65 FE) MG tablet Take 1 tablet (325 mg total) by mouth 2 (two) times daily with a meal. 12/02/18  Yes Bacigalupo, Dionne Bucy, MD  fluconazole (DIFLUCAN) 150 MG tablet Take 1 tablet (150 mg total) by mouth daily. 05/26/21  Yes Rodriguez-Southworth, Sunday Spillers, PA-C  FLUoxetine (PROZAC) 40 MG capsule Take 40 mg by mouth daily. 11/29/19  Yes [provider]  fluticasone (FLONASE) 50 MCG/ACT nasal spray Place 2 sprays into both nostrils daily. 12/02/18  Yes Bacigalupo, Dionne Bucy, MD  hydrALAZINE (APRESOLINE) 50 MG tablet Take 1 tablet (50 mg total) by mouth 3 (three) times daily. 04/06/19  Yes Bacigalupo, Dionne Bucy, MD  lisinopril (ZESTRIL) 20 MG tablet Take 1 tablet (20 mg total) by mouth daily. 04/06/19  Yes Virginia Crews, MD  lurasidone (LATUDA) 80 MG TABS tablet Take one tablet with dinner 04/08/19  Yes [place] Crane, MD  metoprolol succinate (TOPROL-XL) 50 MG 24 hr tablet Take  1 tablet (50 mg total) by mouth daily. Take with or immediately following a meal. 04/06/19  Yes Bacigalupo, Dionne Bucy, MD  Multiple Vitamin (MULTIVITAMIN WITH MINERALS) TABS tablet Take 1 tablet by mouth daily. 07/09/18  Yes Salary, Avel Peace, MD  omeprazole (PRILOSEC) 40 MG capsule Take 1 capsule (40 mg total) by mouth daily. 04/06/19  Yes Bacigalupo, Dionne Bucy, MD  pramipexole (MIRAPEX) 0.25 MG tablet Take 0.25 mg by mouth every 2 (two) hours. 01/17/20  Yes [provider]  cloNIDine (CATAPRES) 0.1 MG tablet Take 1 tablet (0.1 mg total) by mouth 3 (three) times daily. 07/09/18 01/19/20  Salary, Holly Bodily D, MD   sertraline (ZOLOFT) 100 MG tablet Take 2.5 tablets (250 mg total) by mouth daily. 04/08/19 01/19/20  [place] Crane, MD    Family History Family History  Problem Relation Age of Onset   COPD Mother    Skin cancer Mother    Heart defect Mother    Heart attack Maternal Aunt    Cancer Maternal Aunt    Heart disease Maternal Aunt    Heart attack Maternal Uncle    Cancer Maternal Uncle    Heart disease Maternal Uncle    Breast cancer Maternal Grandmother    Melanoma Maternal Uncle    Depression Daughter    Bipolar disorder Daughter    Colon cancer Neg Hx     Social History Social History   Tobacco Use   Smoking status: Former    Packs/day: 0.20    Years: 15.00    Pack years: 3.00    Types: Cigarettes    Quit date: 11/20/2003    Years since quitting: 17.5   Smokeless tobacco: Never  Vaping Use   Vaping Use: Never used  Substance Use Topics   Alcohol use: Yes    Alcohol/week: 8.0 standard drinks    Types: 8 Glasses of wine per week   Drug use: Not Currently    Types: Cocaine, Marijuana     Allergies   Ondansetron   Review of Systems Review of Systems  Constitutional:  Positive for chills, fatigue and fever. Negative for appetite change and diaphoresis.  HENT:  Positive for congestion, postnasal drip, rhinorrhea, sinus pressure and sinus pain. Negative for ear discharge, ear pain, sore throat and trouble swallowing.   Eyes:  Negative for discharge.  Respiratory:  Positive for cough and wheezing. Negative for chest tightness and shortness of breath.   Cardiovascular:  Negative for chest pain.  Genitourinary:  Negative for dysuria, frequency and urgency.  Musculoskeletal:  Positive for myalgias.  Skin:  Negative for rash.  Neurological:  Negative for headaches.  Hematological:  Negative for adenopathy.    Physical Exam Triage Vital Signs ED Triage Vitals  Enc Vitals Group     BP 05/26/21 0812 110/77     Pulse Rate 05/26/21 0812 66     Resp 05/26/21 0812 18      Temp 05/26/21 0812 98.6 F (37 C)     Temp Source 05/26/21 0812 Oral     SpO2 05/26/21 0812 98 %     Weight 05/26/21 0810 180 lb (81.6 kg)     Height 05/26/21 0810 5\' 6"  (1.676 m)     Head Circumference --      Peak Flow --      Pain Score 05/26/21 0810 7     Pain Loc --      Pain Edu? --      Excl. in [place]? --  No data found.  Updated Vital Signs BP 110/77 (BP Location: Left Arm)    Pulse 66    Temp 98.6 F (37 C) (Oral)    Resp 18    Ht 5\' 6"  (1.676 m)    Wt 180 lb (81.6 kg)    SpO2 98%    BMI 29.05 kg/m   Visual Acuity Right Eye Distance:   Left Eye Distance:   Bilateral Distance:    Right Eye Near:   Left Eye Near:    Bilateral Near:      Physical Exam Vitals signs and nursing note reviewed.  Constitutional:      General: She is not in acute distress.    Appearance: Normal appearance. She is not ill-appearing, toxic-appearing or diaphoretic.  HENT:     Head: Normocephalic.     Right Ear: Tympanic membrane, ear canal and external ear normal.     Left Ear: Tympanic membrane, ear canal and external ear normal.     Nose: with moderate swelling of mucosa and light green mucous. Both maxillary sinuses are tender, and R face cheek is a little swollen compared to the L.     Mouth/Throat: clear    Mouth: Mucous membranes are moist.  Eyes:     General: No scleral icterus.       Right eye: No discharge.        Left eye: No discharge.     Conjunctiva/sclera: Conjunctivae normal.  Neck:     Musculoskeletal: Neck supple. No neck rigidity.  Cardiovascular:     Rate and Rhythm: Normal rate and regular rhythm.     Heart sounds: No murmur.  Pulmonary:     Effort: Pulmonary effort is normal.     Breath sounds: Normal breath sounds.   Musculoskeletal: Normal range of motion.  Lymphadenopathy:     Cervical: No cervical adenopathy.  Skin:    General: Skin is warm and dry.     Coloration: Skin is not jaundiced.     Findings: No rash.  Neurological:     Mental Status: She  is alert and oriented to person, place, and time.     Gait: Gait normal.  Psychiatric:        Mood and Affect: Mood normal.        Behavior: Behavior normal.        Thought Content: Thought content normal.        Judgment: Judgment normal.    UC Treatments / Results  Labs (all labs ordered are listed, but only abnormal results are displayed) Labs Reviewed - No data to display   EKG   Radiology No results found.  Procedures Procedures (including critical care time)  Medications Ordered in UC Medications - No data to display  Initial Impression / Assessment and Plan / UC Course  I have reviewed the triage vital signs and the nursing notes. Acute maxillary sinusitis I placed her on Augmentin as noted which is what she responds. Also Tussionex and since she is prone to yeast infections Diflucan as noted. Advised to do saline rinses twice a day for a few days.   Final Clinical Impressions(s) / UC Diagnoses   Final diagnoses:  Acute non-recurrent maxillary sinusitis   Discharge Instructions   None    ED Prescriptions     Medication Sig Dispense Auth. Provider   amoxicillin-clavulanate (AUGMENTIN) 875-125 MG tablet Take 1 tablet by mouth every 12 (twelve) hours. 20 tablet Rodriguez-Southworth, Sunday Spillers, PA-C   fluconazole (DIFLUCAN) 150  MG tablet Take 1 tablet (150 mg total) by mouth daily. 2 tablet Rodriguez-Southworth, Sunday Spillers, PA-C   chlorpheniramine-HYDROcodone (TUSSIONEX PENNKINETIC ER) 10-8 MG/5ML Take 5 mLs by mouth at bedtime as needed for cough. 115 mL Rodriguez-Southworth, Sunday Spillers, PA-C      PDMP not reviewed this encounter.   Shelby Mattocks, [place] 05/26/21 [phone]

## 2021-05-26 NOTE — ED Triage Notes (Signed)
Pt c/o possible sinus infection. Pt is having coughing with green phlegm , nasal congestion x2weeks.

## 2021-10-04 ENCOUNTER — Other Ambulatory Visit: Payer: Self-pay | Admitting: Family Medicine

## 2021-10-04 DIAGNOSIS — R634 Abnormal weight loss: Secondary | ICD-10-CM

## 2021-10-14 ENCOUNTER — Ambulatory Visit: Payer: BC Managed Care – PPO

## 2021-10-24 ENCOUNTER — Ambulatory Visit: Admission: RE | Admit: 2021-10-24 | Payer: BC Managed Care – PPO | Source: Ambulatory Visit

## 2021-11-06 ENCOUNTER — Ambulatory Visit
Admission: RE | Admit: 2021-11-06 | Discharge: 2021-11-06 | Disposition: A | Discharge status: Home / Self Care | Payer: BC Managed Care – PPO | Source: Ambulatory Visit | Attending: Family Medicine | Admitting: Family Medicine

## 2021-11-06 DIAGNOSIS — R634 Abnormal weight loss: Secondary | ICD-10-CM | POA: Diagnosis present

## 2021-11-06 MED ORDER — IOHEXOL 300 MG/ML  SOLN
100.0000 mL | Freq: Once | INTRAMUSCULAR | Status: AC | PRN
  Administered 2021-11-06: 100 mL via INTRAVENOUS

## 2021-11-14 ENCOUNTER — Encounter: Payer: Self-pay | Admitting: Dermatology

## 2021-11-14 ENCOUNTER — Ambulatory Visit (INDEPENDENT_AMBULATORY_CARE_PROVIDER_SITE_OTHER): Payer: BC Managed Care – PPO | Admitting: Dermatology

## 2021-11-14 DIAGNOSIS — L720 Epidermal cyst: Secondary | ICD-10-CM

## 2021-11-14 DIAGNOSIS — C44319 Basal cell carcinoma of skin of other parts of face: Secondary | ICD-10-CM | POA: Diagnosis not present

## 2021-11-14 DIAGNOSIS — Z85828 Personal history of other malignant neoplasm of skin: Secondary | ICD-10-CM | POA: Diagnosis not present

## 2021-11-14 DIAGNOSIS — D492 Neoplasm of unspecified behavior of bone, soft tissue, and skin: Secondary | ICD-10-CM

## 2021-11-14 DIAGNOSIS — C4491 Basal cell carcinoma of skin, unspecified: Secondary | ICD-10-CM

## 2021-11-14 DIAGNOSIS — L578 Other skin changes due to chronic exposure to nonionizing radiation: Secondary | ICD-10-CM | POA: Diagnosis not present

## 2021-11-14 DIAGNOSIS — C44529 Squamous cell carcinoma of skin of other part of trunk: Secondary | ICD-10-CM

## 2021-11-14 DIAGNOSIS — C4492 Squamous cell carcinoma of skin, unspecified: Secondary | ICD-10-CM

## 2021-11-14 HISTORY — DX: Basal cell carcinoma of skin, unspecified: C44.91

## 2021-11-14 HISTORY — DX: Squamous cell carcinoma of skin, unspecified: C44.92

## 2021-11-14 NOTE — Patient Instructions (Addendum)
Wound Care Instructions  Cleanse wound gently with soap and water once a day then pat dry with clean gauze. Apply a thing coat of Petrolatum (petroleum jelly, "Vaseline") over the wound (unless you have an allergy to this). We recommend that you use a new, sterile tube of Vaseline. Do not pick or remove scabs. Do not remove the yellow or white "healing tissue" from the base of the wound.  Cover the wound with fresh, clean, nonstick gauze and secure with paper tape. You may use Band-Aids in place of gauze and tape if the would is small enough, but would recommend trimming much of the tape off as there is often too much. Sometimes Band-Aids can irritate the skin.  You should call the office for your biopsy report after 1 week if you have not already been contacted.  If you experience any problems, such as abnormal amounts of bleeding, swelling, significant bruising, significant pain, or evidence of infection, please call the office immediately.  FOR ADULT SURGERY PATIENTS: If you need something for pain relief you may take 1 extra strength Tylenol (acetaminophen) AND 2 Ibuprofen (200mg each) together every 4 hours as needed for pain. (do not take these if you are allergic to them or if you have a reason you should not take them.) Typically, you may only need pain medication for 1 to 3 days.    Due to recent changes in healthcare laws, you may see results of your pathology and/or laboratory studies on MyChart before the doctors have had a chance to review them. We understand that in some cases there may be results that are confusing or concerning to you. Please understand that not all results are received at the same time and often the doctors may need to interpret multiple results in order to provide you with the best plan of care or course of treatment. Therefore, we ask that you please give us 2 business days to thoroughly review all your results before contacting the office for clarification. Should we  see a critical lab result, you will be contacted sooner.   If You Need Anything After Your Visit  If you have any questions or concerns for your doctor, please call our main line at [phone] and press option 4 to reach your doctor's medical assistant. If no one answers, please leave a voicemail as directed and we will return your call as soon as possible. Messages left after 4 pm will be answered the following business day.   You may also send us a message via MyChart. We typically respond to MyChart messages within 1-2 business days.  For prescription refills, please ask your pharmacy to contact our office. Our fax number is [phone].  If you have an urgent issue when the clinic is closed that cannot wait until the next business day, you can page your doctor at the number below.    Please note that while we do our best to be available for urgent issues outside of office hours, we are not available 24/7.   If you have an urgent issue and are unable to reach us, you may choose to seek medical care at your doctor's office, retail clinic, urgent care center, or emergency room.  If you have a medical emergency, please immediately call 911 or go to the emergency department.  Pager Numbers  - Dr. Kowalski: [phone]  - Dr. Moye: [phone]  - Dr. Stewart: [phone]  In the event of inclement weather, please call our main line at [phone]   for an update on the status of any delays or closures.  Dermatology Medication Tips: Please keep the boxes that topical medications come in in order to help keep track of the instructions about where and how to use these. Pharmacies typically print the medication instructions only on the boxes and not directly on the medication tubes.   If your medication is too expensive, please contact our office at [phone] option 4 or send us a message through MyChart.   We are unable to tell what your co-pay for medications will be in advance  as this is different depending on your insurance coverage. However, we may be able to find a substitute medication at lower cost or fill out paperwork to get insurance to cover a needed medication.   If a prior authorization is required to get your medication covered by your insurance company, please allow us 1-2 business days to complete this process.  Drug prices often vary depending on where the prescription is filled and some pharmacies may offer cheaper prices.  The website www.goodrx.com contains coupons for medications through different pharmacies. The prices here do not account for what the cost may be with help from insurance (it may be cheaper with your insurance), but the website can give you the price if you did not use any insurance.  - You can print the associated coupon and take it with your prescription to the pharmacy.  - You may also stop by our office during regular business hours and pick up a GoodRx coupon card.  - If you need your prescription sent electronically to a different pharmacy, notify our office through [facility] MyChart or by phone at [phone] option 4.     Si Usted Necesita Algo Despus de Su Visita  Tambin puede enviarnos un mensaje a travs de MyChart. Por lo general respondemos a los mensajes de MyChart en el transcurso de 1 a 2 das hbiles.  Para renovar recetas, por favor pida a su farmacia que se ponga en contacto con nuestra oficina. Nuestro nmero de fax es el [phone].  Si tiene un asunto urgente cuando la clnica est cerrada y que no puede esperar hasta el siguiente da hbil, puede llamar/localizar a su doctor(a) al nmero que aparece a continuacin.   Por favor, tenga en cuenta que aunque hacemos todo lo posible para estar disponibles para asuntos urgentes fuera del horario de oficina, no estamos disponibles las 24 horas del da, los 7 das de la semana.   Si tiene un problema urgente y no puede comunicarse con nosotros, puede optar  por buscar atencin mdica  en el consultorio de su doctor(a), en una clnica privada, en un centro de atencin urgente o en una sala de emergencias.  Si tiene una emergencia mdica, por favor llame inmediatamente al 911 o vaya a la sala de emergencias.  Nmeros de bper  - Dr. Kowalski: [phone]  - Dra. Moye: [phone]  - Dra. Stewart: [phone]  En caso de inclemencias del tiempo, por favor llame a nuestra lnea principal al [phone] para una actualizacin sobre el estado de cualquier retraso o cierre.  Consejos para la medicacin en dermatologa: Por favor, guarde las cajas en las que vienen los medicamentos de uso tpico para ayudarle a seguir las instrucciones sobre dnde y cmo usarlos. Las farmacias generalmente imprimen las instrucciones del medicamento slo en las cajas y no directamente en los tubos del medicamento.   Si su medicamento es muy caro, por favor, pngase en contacto con nuestra   oficina llamando al [phone] y presione la opcin 4 o envenos un mensaje a travs de MyChart.   No podemos decirle cul ser su copago por los medicamentos por adelantado ya que esto es diferente dependiendo de la cobertura de su seguro. Sin embargo, es posible que podamos encontrar un medicamento sustituto a menor costo o llenar un formulario para que el seguro cubra el medicamento que se considera necesario.   Si se requiere una autorizacin previa para que su compaa de seguros cubra su medicamento, por favor permtanos de 1 a 2 das hbiles para completar este proceso.  Los precios de los medicamentos varan con frecuencia dependiendo del lugar de dnde se surte la receta y alguna farmacias pueden ofrecer precios ms baratos.  El sitio web www.goodrx.com tiene cupones para medicamentos de diferentes farmacias. Los precios aqu no tienen en cuenta lo que podra costar con la ayuda del seguro (puede ser ms barato con su seguro), pero el sitio web puede darle el precio si  no utiliz ningn seguro.  - Puede imprimir el cupn correspondiente y llevarlo con su receta a la farmacia.  - Tambin puede pasar por nuestra oficina durante el horario de atencin regular y recoger una tarjeta de cupones de GoodRx.  - Si necesita que su receta se enve electrnicamente a una farmacia diferente, informe a nuestra oficina a travs de MyChart de [facility] o por telfono llamando al [phone] y presione la opcin 4.  

## 2021-11-14 NOTE — Progress Notes (Unsigned)
New Patient Visit  Subjective  Lisa Vasquez is a 51 y.o. female who presents for the following: check spots (L cheek, 11m itchy prn/Chest, 2wks, painful, itchy/Hx of BCC). The patient has spots, moles and lesions to be evaluated, some may be new or changing and the patient has concerns that these could be cancer. The patient declines total-body skin exam today.  The following portions of the chart were reviewed this encounter and updated as appropriate:   Tobacco  Allergies  Meds  Problems  Med Hx  Surg Hx  Fam Hx     Review of Systems:  No other skin or systemic complaints except as noted in HPI or Assessment and Plan.  Objective  Well appearing patient in no apparent distress; mood and affect are within normal limits.  A focused examination was performed including face, chest. Relevant physical exam findings are noted in the Assessment and Plan.  L medial cheek 0.7cm pearly pap     R chest 0.8cm hyperkeratotic pap      Assessment & Plan   Milia - tiny firm white papules - type of cyst - benign - may be extracted if symptomatic - observe   Neoplasm of skin (2) L medial cheek Epidermal / dermal shaving  Lesion diameter (cm):  0.7 Informed consent: discussed and consent obtained   Timeout: patient name, date of birth, surgical site, and procedure verified   Procedure prep:  Patient was prepped and draped in usual sterile fashion Prep type:  Isopropyl alcohol Anesthesia: the lesion was anesthetized in a standard fashion   Anesthetic:  1% lidocaine w/ epinephrine 1-100,000 buffered w/ 8.4% NaHCO3 Instrument used: flexible razor blade   Hemostasis achieved with: pressure, aluminum chloride and electrodesiccation   Outcome: patient tolerated procedure well   Post-procedure details: sterile dressing applied and wound care instructions given   Dressing type: bandage and bacitracin    Destruction of lesion Complexity: extensive   Destruction method:  electrodesiccation and curettage   Informed consent: discussed and consent obtained   Timeout:  patient name, date of birth, surgical site, and procedure verified Procedure prep:  Patient was prepped and draped in usual sterile fashion Prep type:  Isopropyl alcohol Anesthesia: the lesion was anesthetized in a standard fashion   Anesthetic:  1% lidocaine w/ epinephrine 1-100,000 buffered w/ 8.4% NaHCO3 Curettage performed in three different directions: Yes   Electrodesiccation performed over the curetted area: Yes   Lesion length (cm):  0.7 Lesion width (cm):  0.7 Margin per side (cm):  0.2 Final wound size (cm):  1.1 Hemostasis achieved with:  pressure, aluminum chloride and electrodesiccation Outcome: patient tolerated procedure well with no complications   Post-procedure details: sterile dressing applied and wound care instructions given   Dressing type: bandage and bacitracin    Specimen 1 - Surgical pathology Differential Diagnosis: D48.5 R/O BCC Check Margins: yes 0.7cm pearly pap EDC  R chest Epidermal / dermal shaving  Lesion diameter (cm):  0.8 Informed consent: discussed and consent obtained   Timeout: patient name, date of birth, surgical site, and procedure verified   Procedure prep:  Patient was prepped and draped in usual sterile fashion Prep type:  Isopropyl alcohol Anesthesia: the lesion was anesthetized in a standard fashion   Anesthetic:  1% lidocaine w/ epinephrine 1-100,000 buffered w/ 8.4% NaHCO3 Instrument used: flexible razor blade   Hemostasis achieved with: pressure, aluminum chloride and electrodesiccation   Outcome: patient tolerated procedure well   Post-procedure details: sterile dressing applied and  wound care instructions given   Dressing type: bandage and bacitracin    Destruction of lesion Complexity: extensive   Destruction method: electrodesiccation and curettage   Informed consent: discussed and consent obtained   Timeout:  patient name,  date of birth, surgical site, and procedure verified Procedure prep:  Patient was prepped and draped in usual sterile fashion Prep type:  Isopropyl alcohol Anesthesia: the lesion was anesthetized in a standard fashion   Anesthetic:  1% lidocaine w/ epinephrine 1-100,000 buffered w/ 8.4% NaHCO3 Curettage performed in three different directions: Yes   Electrodesiccation performed over the curetted area: Yes   Lesion length (cm):  0.8 Lesion width (cm):  0.8 Margin per side (cm):  0.2 Final wound size (cm):  1.2 Hemostasis achieved with:  pressure, aluminum chloride and electrodesiccation Outcome: patient tolerated procedure well with no complications   Post-procedure details: sterile dressing applied and wound care instructions given   Dressing type: bandage and bacitracin    Specimen 2 - Surgical pathology Differential Diagnosis: D48.5 ISK r/o SCC  Check Margins: yes 0.8cm hyperkeratotic pap EDC  L medial cheek R/O BCC, shave removal and EDC today Discussed if txt options EDC today vs Mohs if positive, pt prefers EDC today R chest, ISK r/o SCC, shave removal and EDC today  History of Basal Cell Carcinoma of the Skin - No evidence of recurrence today - Recommend regular full body skin exams - Recommend daily broad spectrum sunscreen SPF 30+ to sun-exposed areas, reapply every 2 hours as needed.  - Call if any new or changing lesions are noted between office visits  - R arm, chest, treated in past by a dermatologist in [place].  Actinic Damage - chronic, secondary to cumulative UV radiation exposure/sun exposure over time - diffuse scaly erythematous macules with underlying dyspigmentation - Recommend daily broad spectrum sunscreen SPF 30+ to sun-exposed areas, reapply every 2 hours as needed.  - Recommend staying in the shade or wearing long sleeves, sun glasses (UVA+UVB protection) and wide brim hats (4-inch brim around the entire circumference of the hat). - Call for new or  changing lesions.   Return in about 6 months (around 05/17/2022) for TBSE, Hx of BCC.  I, Lisa Vasquez, RMA, am acting as scribe for Sarina Ser, MD . Documentation: I have reviewed the above documentation for accuracy and completeness, and I agree with the above.  Sarina Ser, MD

## 2021-11-17 ENCOUNTER — Encounter: Payer: Self-pay | Admitting: Dermatology

## 2021-11-19 ENCOUNTER — Telehealth: Payer: Self-pay

## 2021-11-19 NOTE — Telephone Encounter (Signed)
-----   Message from Ralene Bathe, MD sent at 11/18/2021  5:33 PM EDT ----- Diagnosis 1. Skin , left medial cheek BASAL CELL CARCINOMA, NODULAR AND INFILTRATIVE PATTERNS, BASE INVOLVED 2. 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

## 2021-11-19 NOTE — Telephone Encounter (Signed)
Patient informed of pathology results 

## 2022-03-20 ENCOUNTER — Ambulatory Visit
Admission: EM | Admit: 2022-03-20 | Discharge: 2022-03-20 | Disposition: A | Discharge status: Home / Self Care | Payer: BC Managed Care – PPO | Attending: Physician Assistant | Admitting: Physician Assistant

## 2022-03-20 ENCOUNTER — Encounter: Payer: Self-pay | Admitting: Emergency Medicine

## 2022-03-20 DIAGNOSIS — J208 Acute bronchitis due to other specified organisms: Secondary | ICD-10-CM | POA: Insufficient documentation

## 2022-03-20 DIAGNOSIS — J09X2 Influenza due to identified novel influenza A virus with other respiratory manifestations: Secondary | ICD-10-CM | POA: Diagnosis not present

## 2022-03-20 DIAGNOSIS — Z79899 Other long term (current) drug therapy: Secondary | ICD-10-CM | POA: Diagnosis not present

## 2022-03-20 DIAGNOSIS — Z1152 Encounter for screening for COVID-19: Secondary | ICD-10-CM | POA: Insufficient documentation

## 2022-03-20 LAB — RESP PANEL BY RT-PCR (FLU A&B, COVID) ARPGX2
Influenza A by PCR: POSITIVE — AB
Influenza B by PCR: NEGATIVE
SARS Coronavirus 2 by RT PCR: NEGATIVE

## 2022-03-20 MED ORDER — XOFLUZA (80 MG DOSE) 1 X 80 MG PO TBPK: 1.0000 | ORAL_TABLET | Freq: Once | ORAL | 0 refills | Status: AC

## 2022-03-20 MED ORDER — ALBUTEROL SULFATE (2.5 MG/3ML) 0.083% IN NEBU
2.5000 mg | INHALATION_SOLUTION | Freq: Once | RESPIRATORY_TRACT | Status: AC
  Administered 2022-03-20: 2.5 mg via RESPIRATORY_TRACT

## 2022-03-20 MED ORDER — ALBUTEROL SULFATE HFA 108 (90 BASE) MCG/ACT IN AERS: 2.0000 | INHALATION_SPRAY | Freq: Four times a day (QID) | RESPIRATORY_TRACT | 0 refills | Status: DC | PRN

## 2022-03-20 NOTE — Discharge Instructions (Addendum)
You have influenza A. Please take the Xofluza as prescribed, 1 single dose today. Alternate Tylenol and ibuprofen for body aches. You may take over-the-counter Oscillococcinum to also help with the body aches. Rest and hydrate.  To help with your bronchitis, I have prescribed albuterol.  Please take 2 puffs every 4-6 hours as needed.

## 2022-03-20 NOTE — ED Provider Notes (Signed)
[facility]    CSN: [id number] Arrival date & time: 03/20/22  0800      History   Chief Complaint Chief Complaint  Patient presents with   Cough    HPI Lisa Vasquez is a 52 y.o. female.   51 year old female presents today due to concerns of chest congestion, nasal congestion, headache, body aches, dry cough, and 3 episodes of diarrhea.  She states that her symptoms started Monday evening, diarrhea started yesterday.  She denies any abdominal pain, abdominal cramping, nausea, vomiting.  She denies any rash.  She denies a known fever.  She does have a history of bronchitis, but no other chronic pulmonary issues.  She works at the [employer], denies any known sick contacts.  Has been taking over-the-counter Robitussin and Mucinex without symptomatic relief. Denies sore throat or lymphadenopathy. Took home covid test when sx started which was negative.   Cough Associated symptoms: chills and myalgias     Past Medical History:  Diagnosis Date   Anemia    Anxiety    Arthritis    Basal cell carcinoma    hx of BCCs arms and chest txted in [place] cell carcinoma (BCC) 11/14/2021   L med cheek - ED&C   Blood transfusion without reported diagnosis    Depression    GERD (gastroesophageal reflux disease)    Heart murmur    Hypertension    Skin cancer    Squamous cell carcinoma of skin 11/14/2021   R chest - [facility]    Patient Active Problem List   Diagnosis Date Noted   Alcohol use disorder, moderate, in early remission ([place]) 02/10/2019   History of SCC (squamous cell carcinoma) of skin 12/02/2018   Bipolar I disorder ([place]) 12/02/2018   Anxiety 12/02/2018   Alcohol use disorder, moderate, dependence ([place])    Severe recurrent major depression without psychotic features ([place]) 07/02/2018   History of drug overdose 06/29/2018   HTN (hypertension) 06/29/2018   Iron deficiency anemia 12/26/2015   Low vitamin B12 level 12/26/2015   Anemia 12/19/2015     Past Surgical History:  Procedure Laterality Date   BUNIONECTOMY Right 1998   CERVICAL ABLATION     EYE SURGERY     NASAL SINUS SURGERY  2001   STRABISMUS SURGERY Right    WISDOM TOOTH EXTRACTION      OB History   No obstetric history on file.      Home Medications    Prior to Admission medications   Medication Sig Start Date End Date Taking? Authorizing Provider  Baloxavir Marboxil,80 MG Dose, (XOFLUZA, 80 MG DOSE,) 1 x 80 MG TBPK Take 1 tablet by mouth once for 1 dose. 03/20/22 03/20/22 Yes Vinisha Faxon L, PA  ferrous sulfate 325 (65 FE) MG tablet Take 1 tablet (325 mg total) by mouth 2 (two) times daily with a meal. 12/02/18  Yes Bacigalupo, Dionne Bucy, MD  FLUoxetine (PROZAC) 40 MG capsule Take 40 mg by mouth daily. 11/29/19  Yes [provider]  hydrALAZINE (APRESOLINE) 50 MG tablet Take 1 tablet (50 mg total) by mouth 3 (three) times daily. 04/06/19  Yes Bacigalupo, Dionne Bucy, MD  lisinopril (ZESTRIL) 20 MG tablet Take 1 tablet (20 mg total) by mouth daily. 04/06/19  Yes Virginia Crews, MD  lurasidone (LATUDA) 80 MG TABS tablet Take one tablet with dinner 04/08/19  Yes [place] Crane, MD  metoprolol succinate (TOPROL-XL) 50 MG 24 hr tablet Take 1 tablet (50  mg total) by mouth daily. Take with or immediately following a meal. 04/06/19  Yes Bacigalupo, Dionne Bucy, MD  omeprazole (PRILOSEC) 40 MG capsule Take 1 capsule (40 mg total) by mouth daily. 04/06/19  Yes Bacigalupo, Dionne Bucy, MD  pramipexole (MIRAPEX) 0.25 MG tablet Take 0.25 mg by mouth every 2 (two) hours. 01/17/20  Yes [provider]  albuterol (VENTOLIN HFA) 108 (90 Base) MCG/ACT inhaler Inhale 2 puffs into the lungs every 6 (six) hours as needed for wheezing or shortness of breath. 03/20/22   Jshawn Hurta L, PA  amLODipine (NORVASC) 10 MG tablet Take 1 tablet (10 mg total) by mouth at bedtime. 04/06/19   Bacigalupo, Dionne Bucy, MD  cloNIDine (CATAPRES) 0.1 MG tablet Take 1 tablet (0.1 mg total)  by mouth 3 (three) times daily. 07/09/18 01/19/20  Salary, Holly Bodily D, MD  sertraline (ZOLOFT) 100 MG tablet Take 2.5 tablets (250 mg total) by mouth daily. 04/08/19 01/19/20  [place] Crane, MD    Family History Family History  Problem Relation Age of Onset   COPD Mother    Skin cancer Mother    Heart defect Mother    Heart attack Maternal Aunt    Cancer Maternal Aunt    Heart disease Maternal Aunt    Heart attack Maternal Uncle    Cancer Maternal Uncle    Heart disease Maternal Uncle    Breast cancer Maternal Grandmother    Melanoma Maternal Uncle    Depression Daughter    Bipolar disorder Daughter    Colon cancer Neg Hx     Social History Social History   Tobacco Use   Smoking status: Former    Packs/day: 0.20    Years: 15.00    Total pack years: 3.00    Types: Cigarettes    Quit date: 11/20/2003    Years since quitting: 18.3   Smokeless tobacco: Never  Vaping Use   Vaping Use: Never used  Substance Use Topics   Alcohol use: Yes    Alcohol/week: 8.0 standard drinks of alcohol    Types: 8 Glasses of wine per week   Drug use: Yes    Types: Cocaine, Marijuana     Allergies   Ondansetron   Review of Systems Review of Systems  Constitutional:  Positive for chills and fatigue.  HENT:  Positive for congestion.   Respiratory:  Positive for cough.   Musculoskeletal:  Positive for myalgias.  All other systems reviewed and are negative.    Physical Exam Triage Vital Signs ED Triage Vitals  Enc Vitals Group     BP 03/20/22 0821 92/65     Pulse Rate 03/20/22 0821 84     Resp 03/20/22 0821 18     Temp 03/20/22 0821 97.9 F (36.6 C)     Temp Source 03/20/22 0821 Oral     SpO2 03/20/22 0821 95 %     Weight 03/20/22 0819 179 lb 14.3 oz (81.6 kg)     Height 03/20/22 0819 '5\' 6"'$  (1.676 m)     Head Circumference --      Peak Flow --      Pain Score 03/20/22 0818 0     Pain Loc --      Pain Edu? --      Excl. in [place]? --    No data found.  Updated Vital Signs BP  92/65 (BP Location: Right Arm)   Pulse 84   Temp 97.9 F (36.6 C) (Oral)   Resp 18  Ht '5\' 6"'$  (1.676 m)   Wt 179 lb 14.3 oz (81.6 kg)   SpO2 95%   BMI 29.04 kg/m   Visual Acuity Right Eye Distance:   Left Eye Distance:   Bilateral Distance:    Right Eye Near:   Left Eye Near:    Bilateral Near:     Physical Exam Vitals and nursing note reviewed.  Constitutional:      General: She is not in acute distress.    Appearance: Normal appearance. She is well-developed. She is ill-appearing. She is not toxic-appearing or diaphoretic.  HENT:     Head: Normocephalic and atraumatic.     Right Ear: Tympanic membrane, ear canal and external ear normal. There is no impacted cerumen.     Left Ear: Tympanic membrane, ear canal and external ear normal. There is no impacted cerumen.     Nose: Nose normal. No congestion or rhinorrhea.     Mouth/Throat:     Mouth: Mucous membranes are moist.     Pharynx: Oropharynx is clear. No oropharyngeal exudate or posterior oropharyngeal erythema.  Eyes:     General: No scleral icterus.       Right eye: No discharge.        Left eye: No discharge.     Extraocular Movements: Extraocular movements intact.     Conjunctiva/sclera: Conjunctivae normal.     Pupils: Pupils are equal, round, and reactive to light.  Cardiovascular:     Rate and Rhythm: Normal rate and regular rhythm.     Heart sounds: No murmur heard. Pulmonary:     Effort: Pulmonary effort is normal. No accessory muscle usage, respiratory distress or retractions.     Breath sounds: Normal air entry. No stridor, decreased air movement or transmitted upper airway sounds. Wheezing (mild wheezing posterior lung fields) present. No decreased breath sounds, rhonchi or rales.  Abdominal:     Palpations: Abdomen is soft.     Tenderness: There is no abdominal tenderness.  Musculoskeletal:        General: No swelling.     Cervical back: Normal range of motion and neck supple. No rigidity or  tenderness.  Lymphadenopathy:     Cervical: No cervical adenopathy.  Skin:    General: Skin is warm and dry.     Capillary Refill: Capillary refill takes less than 2 seconds.     Coloration: Skin is not jaundiced.     Findings: No bruising, erythema or rash.  Neurological:     General: No focal deficit present.     Mental Status: She is alert and oriented to person, place, and time.  Psychiatric:        Mood and Affect: Mood normal.      UC Treatments / Results  Labs (all labs ordered are listed, but only abnormal results are displayed) Labs Reviewed  RESP PANEL BY RT-PCR (FLU A&B, COVID) ARPGX2 - Abnormal; Notable for the following components:      Result Value   Influenza A by PCR POSITIVE (*)    All other components within normal limits    EKG   Radiology No results found.  Procedures Procedures (including critical care time)  Medications Ordered in UC Medications  albuterol (PROVENTIL) (2.5 MG/3ML) 0.083% nebulizer solution 2.5 mg (2.5 mg Nebulization Given 03/20/22 0842)    Initial Impression / Assessment and Plan / UC Course  I have reviewed the triage vital signs and the nursing notes.  Pertinent labs & imaging results that were available  during my care of the patient were reviewed by me and considered in my medical decision making (see chart for details).     Influenza A -will start single dose Xofluza today as patient still within the treatment window.  Additional supportive care measures discussed.  Maintain adequate hydration, monitor blood pressure at home.  It is on the lower end today, consider holding blood pressure medication if it remains this low. Acute bronchitis -patient with history of the same.  Patient was provided with albuterol via nebulizer in office, reported significant improvement to her chest tightness and shortness of breath.  Will discharge home with albuterol inhaler, patient has used in the past.  Final diagnoses:  Influenza due to  identified novel influenza A virus with other respiratory manifestations  Acute bronchitis due to other specified organisms     Discharge Instructions      You have influenza A. Please take the Xofluza as prescribed, 1 single dose today. Alternate Tylenol and ibuprofen for body aches. You may take over-the-counter Oscillococcinum to also help with the body aches. Rest and hydrate.  To help with your bronchitis, I have prescribed albuterol.  Please take 2 puffs every 4-6 hours as needed.     ED Prescriptions     Medication Sig Dispense Auth. Provider   Baloxavir Marboxil,80 MG Dose, (XOFLUZA, 80 MG DOSE,) 1 x 80 MG TBPK Take 1 tablet by mouth once for 1 dose. 1 each Denette Hass L, PA   albuterol (VENTOLIN HFA) 108 (90 Base) MCG/ACT inhaler Inhale 2 puffs into the lungs every 6 (six) hours as needed for wheezing or shortness of breath. 18 g Jw Covin L, [place]      PDMP not reviewed this encounter.   Chaney Malling, [place] 03/20/22 [phone]

## 2022-03-20 NOTE — ED Triage Notes (Signed)
Pt c/o nasal congestion, weakness, diarrhea, cough, chills and body aches. Started about 2 days ago. Unsure if she has had a fever. She has been taking mucinex and robitussin without relief.

## 2022-05-19 ENCOUNTER — Encounter: Payer: Self-pay | Admitting: Dermatology

## 2022-05-19 ENCOUNTER — Ambulatory Visit (INDEPENDENT_AMBULATORY_CARE_PROVIDER_SITE_OTHER): Payer: BC Managed Care – PPO | Admitting: Dermatology

## 2022-05-19 VITALS — BP 77/53 | HR 63

## 2022-05-19 DIAGNOSIS — L578 Other skin changes due to chronic exposure to nonionizing radiation: Secondary | ICD-10-CM | POA: Diagnosis not present

## 2022-05-19 DIAGNOSIS — L814 Other melanin hyperpigmentation: Secondary | ICD-10-CM | POA: Diagnosis not present

## 2022-05-19 DIAGNOSIS — Z85828 Personal history of other malignant neoplasm of skin: Secondary | ICD-10-CM

## 2022-05-19 DIAGNOSIS — Z1283 Encounter for screening for malignant neoplasm of skin: Secondary | ICD-10-CM | POA: Diagnosis not present

## 2022-05-19 DIAGNOSIS — D229 Melanocytic nevi, unspecified: Secondary | ICD-10-CM

## 2022-05-19 DIAGNOSIS — L820 Inflamed seborrheic keratosis: Secondary | ICD-10-CM | POA: Diagnosis not present

## 2022-05-19 DIAGNOSIS — L821 Other seborrheic keratosis: Secondary | ICD-10-CM

## 2022-05-19 DIAGNOSIS — D692 Other nonthrombocytopenic purpura: Secondary | ICD-10-CM

## 2022-05-19 DIAGNOSIS — D1801 Hemangioma of skin and subcutaneous tissue: Secondary | ICD-10-CM

## 2022-05-19 DIAGNOSIS — L570 Actinic keratosis: Secondary | ICD-10-CM

## 2022-05-19 DIAGNOSIS — Z8589 Personal history of malignant neoplasm of other organs and systems: Secondary | ICD-10-CM

## 2022-05-19 NOTE — Progress Notes (Signed)
Follow-Up Visit   Subjective  Lisa Vasquez is a 52 y.o. female who presents for the following: Annual Exam (6 month tbse, spot at left upper arm 2 months she would like treated.  ). The patient presents for Total-Body Skin Exam (TBSE) for skin cancer screening and mole check.  The patient has spots, moles and lesions to be evaluated, some may be new or changing and the patient has concerns that these could be cancer.  The following portions of the chart were reviewed this encounter and updated as appropriate:  Tobacco  Allergies  Meds  Problems  Med Hx  Surg Hx  Fam Hx     Review of Systems: No other skin or systemic complaints except as noted in HPI or Assessment and Plan.  Objective  Well appearing patient in no apparent distress; mood and affect are within normal limits.  A full examination was performed including scalp, head, eyes, ears, nose, lips, neck, chest, axillae, abdomen, back, buttocks, bilateral upper extremities, bilateral lower extremities, hands, feet, fingers, toes, fingernails, and toenails. All findings within normal limits unless otherwise noted below.  left deltoid x 1, left popliteal x 1 (2) Erythematous stuck-on, waxy papule or plaque  left cheek x 1 Erythematous thin papules/macules with gritty scale.    Assessment & Plan  Inflamed seborrheic keratosis (2) left deltoid x 1, left popliteal x 1 Symptomatic, irritating, patient would like treated. Destruction of lesion - left deltoid x 1, left popliteal x 1 Complexity: simple   Destruction method: cryotherapy   Informed consent: discussed and consent obtained   Timeout:  patient name, date of birth, surgical site, and procedure verified Lesion destroyed using liquid nitrogen: Yes   Region frozen until ice ball extended beyond lesion: Yes   Outcome: patient tolerated procedure well with no complications   Post-procedure details: wound care instructions given   Additional details:  Prior to  procedure, discussed risks of blister formation, small wound, skin dyspigmentation, or rare scar following cryotherapy. Recommend Vaseline ointment to treated areas while healing.  Actinic keratosis left cheek x 1 Actinic keratoses are precancerous spots that appear secondary to cumulative UV radiation exposure/sun exposure over time. They are chronic with expected duration over 1 year. A portion of actinic keratoses will progress to squamous cell carcinoma of the skin. It is not possible to reliably predict which spots will progress to skin cancer and so treatment is recommended to prevent development of skin cancer.  Recommend daily broad spectrum sunscreen SPF 30+ to sun-exposed areas, reapply every 2 hours as needed.  Recommend staying in the shade or wearing long sleeves, sun glasses (UVA+UVB protection) and wide brim hats (4-inch brim around the entire circumference of the hat). Call for new or changing lesions.  Destruction of lesion - left cheek x 1 Complexity: simple   Destruction method: cryotherapy   Informed consent: discussed and consent obtained   Timeout:  patient name, date of birth, surgical site, and procedure verified Lesion destroyed using liquid nitrogen: Yes   Region frozen until ice ball extended beyond lesion: Yes   Outcome: patient tolerated procedure well with no complications   Post-procedure details: wound care instructions given   Additional details:  Prior to procedure, discussed risks of blister formation, small wound, skin dyspigmentation, or rare scar following cryotherapy. Recommend Vaseline ointment to treated areas while healing.  Lentigines - Scattered tan macules - Due to sun exposure - Benign-appearing, observe - Recommend daily broad spectrum sunscreen SPF 30+ to sun-exposed areas,  reapply every 2 hours as needed. - Call for any changes  Seborrheic Keratoses - Stuck-on, waxy, tan-brown papules and/or plaques  - Benign-appearing - Discussed benign  etiology and prognosis. - Observe - Call for any changes  Melanocytic Nevi - Tan-brown and/or pink-flesh-colored symmetric macules and papules - Benign appearing on exam today - Observation - Call clinic for new or changing moles - Recommend daily use of broad spectrum spf 30+ sunscreen to sun-exposed areas.   Purpura - Chronic; persistent and recurrent.  Treatable, but not curable. - Violaceous macules and patches - Benign - Related to trauma, age, sun damage and/or use of blood thinners, chronic use of topical and/or oral steroids - Observe - Can use OTC arnica containing moisturizer such as Dermend Bruise Formula if desired - Call for worsening or other concerns  Hemangiomas - Red papules - Discussed benign nature - Observe - Call for any changes  Actinic Damage - Chronic condition, secondary to cumulative UV/sun exposure - diffuse scaly erythematous macules with underlying dyspigmentation - Recommend daily broad spectrum sunscreen SPF 30+ to sun-exposed areas, reapply every 2 hours as needed.  - Staying in the shade or wearing long sleeves, sun glasses (UVA+UVB protection) and wide brim hats (4-inch brim around the entire circumference of the hat) are also recommended for sun protection.  - Call for new or changing lesions.  History of Basal Cell Carcinoma of the Skin - No evidence of recurrence today - Recommend regular full body skin exams - Recommend daily broad spectrum sunscreen SPF 30+ to sun-exposed areas, reapply every 2 hours as needed.  - Call if any new or changing lesions are noted between office visits  History of Squamous Cell Carcinoma of the Skin - No evidence of recurrence today - No lymphadenopathy - Recommend regular full body skin exams - Recommend daily broad spectrum sunscreen SPF 30+ to sun-exposed areas, reapply every 2 hours as needed.  - Call if any new or changing lesions are noted between office visits  Skin cancer screening performed  today. Return in about 6 months (around 11/17/2022) for tbse. IRuthell Rummage, CMA, am acting as scribe for Sarina Ser, MD. Documentation: I have reviewed the above documentation for accuracy and completeness, and I agree with the above.  Sarina Ser, MD

## 2022-05-19 NOTE — Patient Instructions (Addendum)
Seborrheic Keratosis  What causes seborrheic keratoses? Seborrheic keratoses are harmless, common skin growths that first appear during adult life.  As time goes by, more growths appear.  Some people may develop a large number of them.  Seborrheic keratoses appear on both covered and uncovered body parts.  They are not caused by sunlight.  The tendency to develop seborrheic keratoses can be inherited.  They vary in color from skin-colored to gray, brown, or even black.  They can be either smooth or have a rough, warty surface.   Seborrheic keratoses are superficial and look as if they were stuck on the skin.  Under the microscope this type of keratosis looks like layers upon layers of skin.  That is why at times the top layer may seem to fall off, but the rest of the growth remains and re-grows.    Treatment Seborrheic keratoses do not need to be treated, but can easily be removed in the office.  Seborrheic keratoses often cause symptoms when they rub on clothing or jewelry.  Lesions can be in the way of shaving.  If they become inflamed, they can cause itching, soreness, or burning.  Removal of a seborrheic keratosis can be accomplished by freezing, burning, or surgery. If any spot bleeds, scabs, or grows rapidly, please return to have it checked, as these can be an indication of a skin cancer.  Cryotherapy Aftercare  Wash gently with soap and water everyday.   Apply Vaseline and Band-Aid daily until healed.   Actinic keratoses are precancerous spots that appear secondary to cumulative UV radiation exposure/sun exposure over time. They are chronic with expected duration over 1 year. A portion of actinic keratoses will progress to squamous cell carcinoma of the skin. It is not possible to reliably predict which spots will progress to skin cancer and so treatment is recommended to prevent development of skin cancer.  Recommend daily broad spectrum sunscreen SPF 30+ to sun-exposed areas, reapply every  2 hours as needed.  Recommend staying in the shade or wearing long sleeves, sun glasses (UVA+UVB protection) and wide brim hats (4-inch brim around the entire circumference of the hat). Call for new or changing lesions.    Melanoma ABCDEs  Melanoma is the most dangerous type of skin cancer, and is the leading cause of death from skin disease.  You are more likely to develop melanoma if you: Have light-colored skin, light-colored eyes, or red or blond hair Spend a lot of time in the sun Tan regularly, either outdoors or in a tanning bed Have had blistering sunburns, especially during childhood Have a close family member who has had a melanoma Have atypical moles or large birthmarks  Early detection of melanoma is key since treatment is typically straightforward and cure rates are extremely high if we catch it early.   The first sign of melanoma is often a change in a mole or a new dark spot.  The ABCDE system is a way of remembering the signs of melanoma.  A for asymmetry:  The two halves do not match. B for border:  The edges of the growth are irregular. C for color:  A mixture of colors are present instead of an even brown color. D for diameter:  Melanomas are usually (but not always) greater than 6mm - the size of a pencil eraser. E for evolution:  The spot keeps changing in size, shape, and color.  Please check your skin once per month between visits. You can use a small   mirror in front and a large mirror behind you to keep an eye on the back side or your body.   If you see any new or changing lesions before your next follow-up, please call to schedule a visit.  Please continue daily skin protection including broad spectrum sunscreen SPF 30+ to sun-exposed areas, reapplying every 2 hours as needed when you're outdoors.   Staying in the shade or wearing long sleeves, sun glasses (UVA+UVB protection) and wide brim hats (4-inch brim around the entire circumference of the hat) are also  recommended for sun protection.     Due to recent changes in healthcare laws, you may see results of your pathology and/or laboratory studies on MyChart before the doctors have had a chance to review them. We understand that in some cases there may be results that are confusing or concerning to you. Please understand that not all results are received at the same time and often the doctors may need to interpret multiple results in order to provide you with the best plan of care or course of treatment. Therefore, we ask that you please give us 2 business days to thoroughly review all your results before contacting the office for clarification. Should we see a critical lab result, you will be contacted sooner.   If You Need Anything After Your Visit  If you have any questions or concerns for your doctor, please call our main line at [phone] and press option 4 to reach your doctor's medical assistant. If no one answers, please leave a voicemail as directed and we will return your call as soon as possible. Messages left after 4 pm will be answered the following business day.   You may also send us a message via MyChart. We typically respond to MyChart messages within 1-2 business days.  For prescription refills, please ask your pharmacy to contact our office. Our fax number is [phone].  If you have an urgent issue when the clinic is closed that cannot wait until the next business day, you can page your doctor at the number below.    Please note that while we do our best to be available for urgent issues outside of office hours, we are not available 24/7.   If you have an urgent issue and are unable to reach us, you may choose to seek medical care at your doctor's office, retail clinic, urgent care center, or emergency room.  If you have a medical emergency, please immediately call 911 or go to the emergency department.  Pager Numbers  - Dr. Kowalski: [phone]  - Dr. Moye:  [phone]  - Dr. Stewart: [phone]  In the event of inclement weather, please call our main line at [phone] for an update on the status of any delays or closures.  Dermatology Medication Tips: Please keep the boxes that topical medications come in in order to help keep track of the instructions about where and how to use these. Pharmacies typically print the medication instructions only on the boxes and not directly on the medication tubes.   If your medication is too expensive, please contact our office at [phone] option 4 or send us a message through MyChart.   We are unable to tell what your co-pay for medications will be in advance as this is different depending on your insurance coverage. However, we may be able to find a substitute medication at lower cost or fill out paperwork to get insurance to cover a needed medication.   If a prior   is required to get your medication covered by your insurance company, please allow us 1-2 business days to complete this process.  Drug prices often vary depending on where the prescription is filled and some pharmacies may offer cheaper prices.  The website www.goodrx.com contains coupons for medications through different pharmacies. The prices here do not account for what the cost may be with help from insurance (it may be cheaper with your insurance), but the website can give you the price if you did not use any insurance.  - You can print the associated coupon and take it with your prescription to the pharmacy.  - You may also stop by our office during regular business hours and pick up a GoodRx coupon card.  - If you need your prescription sent electronically to a different pharmacy, notify our office through [facility] MyChart or by phone at [phone] option 4.     Si Usted Necesita Algo Despus de Su Visita  Tambin puede enviarnos un mensaje a travs de MyChart. Por lo general respondemos a los mensajes de  MyChart en el transcurso de 1 a 2 das hbiles.  Para renovar recetas, por favor pida a su farmacia que se ponga en contacto con nuestra oficina. Nuestro nmero de fax es el [phone].  Si tiene un asunto urgente cuando la clnica est cerrada y que no puede esperar hasta el siguiente da hbil, puede llamar/localizar a su doctor(a) al nmero que aparece a continuacin.   Por favor, tenga en cuenta que aunque hacemos todo lo posible para estar disponibles para asuntos urgentes fuera del horario de oficina, no estamos disponibles las 24 horas del da, los 7 das de la semana.   Si tiene un problema urgente y no puede comunicarse con nosotros, puede optar por buscar atencin mdica  en el consultorio de su doctor(a), en una clnica privada, en un centro de atencin urgente o en una sala de emergencias.  Si tiene una emergencia mdica, por favor llame inmediatamente al 911 o vaya a la sala de emergencias.  Nmeros de bper  - Dr. Kowalski: [phone]  - Dra. Moye: [phone]  - Dra. Stewart: [phone]  En caso de inclemencias del tiempo, por favor llame a nuestra lnea principal al [phone] para una actualizacin sobre el estado de cualquier retraso o cierre.  Consejos para la medicacin en dermatologa: Por favor, guarde las cajas en las que vienen los medicamentos de uso tpico para ayudarle a seguir las instrucciones sobre dnde y cmo usarlos. Las farmacias generalmente imprimen las instrucciones del medicamento slo en las cajas y no directamente en los tubos del medicamento.   Si su medicamento es muy caro, por favor, pngase en contacto con nuestra oficina llamando al [phone] y presione la opcin 4 o envenos un mensaje a travs de MyChart.   No podemos decirle cul ser su copago por los medicamentos por adelantado ya que esto es diferente dependiendo de la cobertura de su seguro. Sin embargo, es posible que podamos encontrar un medicamento sustituto a menor costo o  llenar un formulario para que el seguro cubra el medicamento que se considera necesario.   Si se requiere una autorizacin previa para que su compaa de seguros cubra su medicamento, por favor permtanos de 1 a 2 das hbiles para completar este proceso.  Los precios de los medicamentos varan con frecuencia dependiendo del lugar de dnde se surte la receta y alguna farmacias pueden ofrecer precios ms baratos.  El sitio web www.goodrx.com tiene cupones para medicamentos   de diferentes farmacias. Los precios aqu no tienen en cuenta lo que podra costar con la ayuda del seguro (puede ser ms barato con su seguro), pero el sitio web puede darle el precio si no utiliz ningn seguro.  - Puede imprimir el cupn correspondiente y llevarlo con su receta a la farmacia.  - Tambin puede pasar por nuestra oficina durante el horario de atencin regular y recoger una tarjeta de cupones de GoodRx.  - Si necesita que su receta se enve electrnicamente a una farmacia diferente, informe a nuestra oficina a travs de MyChart de [place] o por telfono llamando al [phone] y presione la opcin 4.  

## 2022-05-23 ENCOUNTER — Encounter: Payer: Self-pay | Admitting: Dermatology

## 2022-05-28 ENCOUNTER — Encounter: Payer: Self-pay | Admitting: Hematology and Oncology

## 2022-06-09 ENCOUNTER — Encounter: Payer: Self-pay | Admitting: Hematology and Oncology

## 2022-06-09 ENCOUNTER — Ambulatory Visit (INDEPENDENT_AMBULATORY_CARE_PROVIDER_SITE_OTHER): Payer: BC Managed Care – PPO

## 2022-06-09 DIAGNOSIS — K3189 Other diseases of stomach and duodenum: Secondary | ICD-10-CM

## 2022-06-09 DIAGNOSIS — K5730 Diverticulosis of large intestine without perforation or abscess without bleeding: Secondary | ICD-10-CM

## 2022-06-09 DIAGNOSIS — K449 Diaphragmatic hernia without obstruction or gangrene: Secondary | ICD-10-CM

## 2022-06-09 DIAGNOSIS — K640 First degree hemorrhoids: Secondary | ICD-10-CM

## 2022-06-09 DIAGNOSIS — K2950 Unspecified chronic gastritis without bleeding: Secondary | ICD-10-CM

## 2022-06-09 DIAGNOSIS — Z1211 Encounter for screening for malignant neoplasm of colon: Secondary | ICD-10-CM

## 2022-08-06 ENCOUNTER — Other Ambulatory Visit: Payer: Self-pay | Admitting: Anesthesiology

## 2022-08-06 DIAGNOSIS — M5412 Radiculopathy, cervical region: Secondary | ICD-10-CM

## 2022-08-27 ENCOUNTER — Ambulatory Visit
Admission: RE | Admit: 2022-08-27 | Discharge: 2022-08-27 | Disposition: A | Discharge status: Home / Self Care | Payer: BC Managed Care – PPO | Source: Ambulatory Visit | Attending: Anesthesiology | Admitting: Anesthesiology

## 2022-08-27 DIAGNOSIS — M5412 Radiculopathy, cervical region: Secondary | ICD-10-CM | POA: Diagnosis not present

## 2022-10-29 ENCOUNTER — Ambulatory Visit: Payer: BC Managed Care – PPO | Admitting: Dermatology

## 2023-01-14 ENCOUNTER — Encounter: Payer: Self-pay | Admitting: Hematology and Oncology

## 2023-01-21 ENCOUNTER — Ambulatory Visit
Admission: EM | Admit: 2023-01-21 | Discharge: 2023-01-21 | Disposition: A | Discharge status: Home / Self Care | Payer: BC Managed Care – PPO | Attending: Physician Assistant | Admitting: Physician Assistant

## 2023-01-21 ENCOUNTER — Ambulatory Visit (INDEPENDENT_AMBULATORY_CARE_PROVIDER_SITE_OTHER): Payer: BC Managed Care – PPO

## 2023-01-21 DIAGNOSIS — R0602 Shortness of breath: Secondary | ICD-10-CM

## 2023-01-21 DIAGNOSIS — Z87891 Personal history of nicotine dependence: Secondary | ICD-10-CM

## 2023-01-21 DIAGNOSIS — J209 Acute bronchitis, unspecified: Secondary | ICD-10-CM

## 2023-01-21 DIAGNOSIS — K219 Gastro-esophageal reflux disease without esophagitis: Secondary | ICD-10-CM | POA: Diagnosis not present

## 2023-01-21 DIAGNOSIS — I10 Essential (primary) hypertension: Secondary | ICD-10-CM | POA: Insufficient documentation

## 2023-01-21 DIAGNOSIS — R051 Acute cough: Secondary | ICD-10-CM | POA: Diagnosis present

## 2023-01-21 DIAGNOSIS — R079 Chest pain, unspecified: Secondary | ICD-10-CM | POA: Insufficient documentation

## 2023-01-21 DIAGNOSIS — Z1152 Encounter for screening for COVID-19: Secondary | ICD-10-CM | POA: Insufficient documentation

## 2023-01-21 LAB — SARS CORONAVIRUS 2 BY RT PCR: SARS Coronavirus 2 by RT PCR: NEGATIVE

## 2023-01-21 MED ORDER — PREDNISONE 20 MG PO TABS: 40.0000 mg | ORAL_TABLET | Freq: Every day | ORAL | 0 refills | Status: AC

## 2023-01-21 MED ORDER — PROMETHAZINE-DM 6.25-15 MG/5ML PO SYRP: 5.0000 mL | ORAL_SOLUTION | Freq: Four times a day (QID) | ORAL | 0 refills | Status: DC | PRN

## 2023-01-21 MED ORDER — ALBUTEROL SULFATE (2.5 MG/3ML) 0.083% IN NEBU
2.5000 mg | INHALATION_SOLUTION | Freq: Once | RESPIRATORY_TRACT | Status: AC
  Administered 2023-01-21: 2.5 mg via RESPIRATORY_TRACT

## 2023-01-21 MED ORDER — ALBUTEROL SULFATE HFA 108 (90 BASE) MCG/ACT IN AERS: 1.0000 | INHALATION_SPRAY | Freq: Four times a day (QID) | RESPIRATORY_TRACT | 0 refills | Status: DC | PRN

## 2023-01-21 NOTE — ED Provider Notes (Signed)
[facility]    CSN: [id number] Arrival date & time: 01/21/23  1531      History   Chief Complaint Chief Complaint  Patient presents with   Cough    HPI Lisa Vasquez is a 52 y.o. female with history of hypertension, hyperlipidemia, GERD, anemia, anxiety, Bipolar disorder, basal cell carcinoma, squamous cell carcinoma, and history of alcohol abuse.   Patient presents today for productive cough, fatigue, headaches, nasal congestion, scratchy throat, chest pain with coughing and shortness of breath since yesterday. Denies fever, sore throat, abdominal pain, n/v. Has been in contact with sick co-workers. No history of asthma, COPD. Former smoker. Has been taking Mucinex and OTC Sudafed.  HPI  Past Medical History:  Diagnosis Date   Anemia    Anxiety    Arthritis    Basal cell carcinoma    hx of BCCs arms and chest txted in [facility]   Basal cell carcinoma (BCC) 11/14/2021   L med cheek - ED&C   Blood transfusion without reported diagnosis    Depression    GERD (gastroesophageal reflux disease)    Heart murmur    Hypertension    Skin cancer    Squamous cell carcinoma of skin 11/14/2021   R chest - [facility]    Patient Active Problem List   Diagnosis Date Noted   Alcohol use disorder, moderate, in early remission (HCC) 02/10/2019   History of SCC (squamous cell carcinoma) of skin 12/02/2018   Bipolar I disorder (HCC) 12/02/2018   Anxiety 12/02/2018   Alcohol use disorder, moderate, dependence (HCC)    Severe recurrent major depression without psychotic features (HCC) 07/02/2018   History of drug overdose 06/29/2018   HTN (hypertension) 06/29/2018   Iron deficiency anemia 12/26/2015   Low vitamin B12 level 12/26/2015   Anemia 12/19/2015    Past Surgical History:  Procedure Laterality Date   BUNIONECTOMY Right 1998   CERVICAL ABLATION     EYE SURGERY     NASAL SINUS SURGERY  2001   STRABISMUS SURGERY Right    WISDOM TOOTH EXTRACTION      OB History    No obstetric history on file.      Home Medications    Prior to Admission medications   Medication Sig Start Date End Date Taking? Authorizing Provider  albuterol (VENTOLIN HFA) 108 (90 Base) MCG/ACT inhaler Inhale 1-2 puffs into the lungs every 6 (six) hours as needed for wheezing or shortness of breath. 01/21/23  Yes Eusebio Friendly B, PA-C  amLODipine (NORVASC) 10 MG tablet Take 1 tablet (10 mg total) by mouth at bedtime. 04/06/19  Yes Bacigalupo, Marzella Schlein, MD  ferrous sulfate 325 (65 FE) MG tablet Take 1 tablet (325 mg total) by mouth 2 (two) times daily with a meal. 12/02/18  Yes Bacigalupo, Marzella Schlein, MD  FLUoxetine (PROZAC) 40 MG capsule Take 40 mg by mouth daily. 11/29/19  Yes [provider]  lisinopril (ZESTRIL) 20 MG tablet Take 1 tablet (20 mg total) by mouth daily. 04/06/19  Yes Erasmo Downer, MD  lurasidone (LATUDA) 80 MG TABS tablet Take one tablet with dinner 04/08/19  Yes Zena Amos, MD  metoprolol succinate (TOPROL-XL) 50 MG 24 hr tablet Take 1 tablet (50 mg total) by mouth daily. Take with or immediately following a meal. 04/06/19  Yes Bacigalupo, Marzella Schlein, MD  omeprazole (PRILOSEC) 40 MG capsule Take 1 capsule (40 mg total) by mouth daily. 04/06/19  Yes Bacigalupo, Marzella Schlein, MD  pramipexole (MIRAPEX) 0.25  MG tablet Take 0.25 mg by mouth every 2 (two) hours. 01/17/20  Yes [provider]  predniSONE (DELTASONE) 20 MG tablet Take 2 tablets (40 mg total) by mouth daily for 5 days. 01/21/23 01/26/23 Yes Shirlee Latch, PA-C  promethazine-dextromethorphan (PROMETHAZINE-DM) 6.25-15 MG/5ML syrup Take 5 mLs by mouth 4 (four) times daily as needed. 01/21/23  Yes Shirlee Latch, PA-C  albuterol (VENTOLIN HFA) 108 (90 Base) MCG/ACT inhaler Inhale 2 puffs into the lungs every 6 (six) hours as needed for wheezing or shortness of breath. 03/20/22   Crain, Whitney L, PA  hydrALAZINE (APRESOLINE) 50 MG tablet Take 1 tablet (50 mg total) by mouth 3 (three) times daily.  04/06/19   Bacigalupo, Marzella Schlein, MD  cloNIDine (CATAPRES) 0.1 MG tablet Take 1 tablet (0.1 mg total) by mouth 3 (three) times daily. 07/09/18 01/19/20  Salary, Jetty Duhamel D, MD  sertraline (ZOLOFT) 100 MG tablet Take 2.5 tablets (250 mg total) by mouth daily. 04/08/19 01/19/20  Zena Amos, MD    Family History Family History  Problem Relation Age of Onset   COPD Mother    Skin cancer Mother    Heart defect Mother    Heart attack Maternal Aunt    Cancer Maternal Aunt    Heart disease Maternal Aunt    Heart attack Maternal Uncle    Cancer Maternal Uncle    Heart disease Maternal Uncle    Breast cancer Maternal Grandmother    Melanoma Maternal Uncle    Depression Daughter    Bipolar disorder Daughter    Colon cancer Neg Hx     Social History Social History   Tobacco Use   Smoking status: Former    Current packs/day: 0.00    Average packs/day: 0.2 packs/day for 15.0 years (3.0 ttl pk-yrs)    Types: Cigarettes    Start date: 11/19/1988    Quit date: 11/20/2003    Years since quitting: 19.1   Smokeless tobacco: Never  Vaping Use   Vaping status: Never Used  Substance Use Topics   Alcohol use: Yes    Alcohol/week: 8.0 standard drinks of alcohol    Types: 8 Glasses of wine per week   Drug use: Yes    Types: Cocaine, Marijuana     Allergies   Ondansetron   Review of Systems Review of Systems  Constitutional:  Positive for fatigue. Negative for chills, diaphoresis and fever.  HENT:  Positive for congestion and rhinorrhea. Negative for ear pain, sinus pain and sore throat.   Respiratory:  Positive for cough and shortness of breath.   Cardiovascular:  Positive for chest pain.  Gastrointestinal:  Negative for abdominal pain, nausea and vomiting.  Musculoskeletal:  Negative for myalgias.  Skin:  Negative for rash.  Neurological:  Positive for headaches. Negative for weakness.  Hematological:  Negative for adenopathy.     Physical Exam Triage Vital Signs ED Triage Vitals   Encounter Vitals Group     BP      Systolic BP Percentile      Diastolic BP Percentile      Pulse      Resp      Temp      Temp src      SpO2      Weight      Height      Head Circumference      Peak Flow      Pain Score      Pain Loc  Pain Education      Exclude from Growth Chart    No data found.  Updated Vital Signs BP (!) 145/96 (BP Location: Left Arm)   Pulse 74   Temp 98.9 F (37.2 C) (Oral)   SpO2 94%       Physical Exam Vitals and nursing note reviewed.  Constitutional:      General: She is not in acute distress.    Appearance: Normal appearance. She is ill-appearing. She is not toxic-appearing.  HENT:     Head: Normocephalic and atraumatic.     Nose: Congestion present.     Mouth/Throat:     Mouth: Mucous membranes are moist.     Pharynx: Oropharynx is clear.  Eyes:     General: No scleral icterus.       Right eye: No discharge.        Left eye: No discharge.     Conjunctiva/sclera: Conjunctivae normal.  Cardiovascular:     Rate and Rhythm: Normal rate and regular rhythm.     Heart sounds: Normal heart sounds.  Pulmonary:     Effort: Pulmonary effort is normal. No respiratory distress.     Breath sounds: Wheezing (expiratory wheezes throughout) present.  Musculoskeletal:     Cervical back: Neck supple.  Skin:    General: Skin is dry.  Neurological:     General: No focal deficit present.     Mental Status: She is alert. Mental status is at baseline.     Motor: No weakness.     Gait: Gait normal.  Psychiatric:        Mood and Affect: Mood normal.        Behavior: Behavior normal.        Thought Content: Thought content normal.      UC Treatments / Results  Labs (all labs ordered are listed, but only abnormal results are displayed) Labs Reviewed  SARS CORONAVIRUS 2 BY RT PCR    EKG   Radiology DG Chest 2 View  Result Date: 01/21/2023 CLINICAL DATA:  Cough, wheezing and shortness of breath EXAM: CHEST - 2 VIEW COMPARISON:   02/11/2021 FINDINGS: The heart size and mediastinal contours are within normal limits. Both lungs are clear. Stable moderate-sized hiatal hernia. The visualized skeletal structures are unremarkable. IMPRESSION: No active cardiopulmonary disease. Electronically Signed   By: Judie Petit.  Shick M.D.   On: 01/21/2023 16:47    Procedures Procedures (including critical care time)  Medications Ordered in UC Medications  albuterol (PROVENTIL) (2.5 MG/3ML) 0.083% nebulizer solution 2.5 mg (2.5 mg Nebulization Given 01/21/23 1702)    Initial Impression / Assessment and Plan / UC Course  I have reviewed the triage vital signs and the nursing notes.  Pertinent labs & imaging results that were available during my care of the patient were reviewed by me and considered in my medical decision making (see chart for details).   52 y/o female with history of hypertension, hyperlipidemia, skin cancer, Bipolar disorder, and previous tobacco abuse. Patient reports onset of fatigue, productive cough and SOB yesterday. No history of asthma or COPD.   BP 162/97. O2 94%. She is afebrile. Ill appearing but non toxic. No distress.  On exam, patient has nasal congestion and expiratory wheezes throughout.  COVID test and CXR ordered.  COVID test negative.  Chest x-ray without any acute abnormality.  Reviewed all results with patient.  She was given albuterol nebulizer.  She reported improvement in her symptoms with the nebulizer.  Will prescribe albuterol  inhaler.  Viral bronchitis.  Sent prednisone to pharmacy as well as Promethazine DM.  Encouraged increasing rest and fluids.  Reviewed return precautions.   Final Clinical Impressions(s) / UC Diagnoses   Final diagnoses:  Acute bronchitis, unspecified organism  Acute cough  Shortness of breath  Former smoker     Discharge Instructions      -COVID test is negative. - Chest x-ray is negative for pneumonia. - You have bronchitis.  I sent cough medication,  prednisone and an inhaler for you. - Bronchitis can last for a few weeks.  You should be seen again if you develop fever, have worsening cough or increased shortness of breath.     ED Prescriptions     Medication Sig Dispense Auth. Provider   predniSONE (DELTASONE) 20 MG tablet Take 2 tablets (40 mg total) by mouth daily for 5 days. 10 tablet Shirlee Latch, PA-C   promethazine-dextromethorphan (PROMETHAZINE-DM) 6.25-15 MG/5ML syrup Take 5 mLs by mouth 4 (four) times daily as needed. 118 mL Eusebio Friendly B, PA-C   albuterol (VENTOLIN HFA) 108 (90 Base) MCG/ACT inhaler Inhale 1-2 puffs into the lungs every 6 (six) hours as needed for wheezing or shortness of breath. 1 g Shirlee Latch, PA-C      PDMP not reviewed this encounter.   Shirlee Latch, PA-C 01/21/23 1744

## 2023-01-21 NOTE — ED Triage Notes (Signed)
Pt presents to UC c/o cough, SOB onset today and cough onset yesterday. Pt states cough is productive. Pt has been taking mucinex OTC and sinus allergy relief with no relief.

## 2023-01-21 NOTE — Discharge Instructions (Addendum)
-  COVID test is negative. - Chest x-ray is negative for pneumonia. - You have bronchitis.  I sent cough medication, prednisone and an inhaler for you. - Bronchitis can last for a few weeks.  You should be seen again if you develop fever, have worsening cough or increased shortness of breath.

## 2024-01-07 ENCOUNTER — Other Ambulatory Visit: Payer: Self-pay | Admitting: Family Medicine

## 2024-01-07 DIAGNOSIS — Z1231 Encounter for screening mammogram for malignant neoplasm of breast: Secondary | ICD-10-CM

## 2024-01-17 NOTE — H&P (View-Only) (Signed)
 HISTORY OF PRESENT ILLNESS:   Lisa Vasquez is a 53 y.o. female that is presenting to clinic for their follow up of DX:   Encounter Diagnoses  Name Primary?  . Bunion, right foot   . Bunion, left foot Yes    Current Pain Scale: 5/10 - 20% better   Prior Pain Scale: 8/10    BACKGROUND: PAINFUL BUNIONS LEFT>>RIGHT x 1 year progressive pain  At prior appointment at our offices the following treatment was recommended / prescribed to the patient:   - Activity: awactivity: WBAT  - Immobilization / Support: HOKA / rockerbottom shoe  ;  **Has goodfeet orthotics that she is to continue using at all times x 3 weeks OR use HOKA at all times - discussed with patient.  - PO Medication: NSAID only  - Injection at last visit: no ; Any improvement noted: no  - Topical Medication: Voltaren  Has Patient Been Compliant with Recommendations?:  YES  Overall, the patient perceives their condition course has: IMPROVED  No interval health changes.  No other acute pedal complaints at this time.    Of note: - Patient IS NOT diabetic.  - Patient HAS history of smoking. Recreational - daily (helps with some of the pain)  - Patient WORKS. (Work: [employer])    Objective   Physical Exam  BP 109/73   Ht 167.6 cm (5' 5.98)   Wt 87.8 kg (193 lb 9 oz)   BMI 31.26 kg/m   Constitutional: Patient appears of normal development and good nutritional status for stated age, well-groomed, and of normal body habitus. Phonating appropriately.  Lymphatic: On examination of both lower extremities, there is no lymphadenopathy, lymphangitis, or lymphedema on either the right or the left feet or legs.  Psychiatric: Alert and oriented to time, place, person and situation. The patient is noted to have good judgment and insight into the reason for today's visit and treatments.  FOCUSED LOWER EXTREMITY EXAMINATION:  Neurological: Gross protective sensation intact bilaterally. No focal motor or  sensory deficits identified bilaterally. No Tinels or Valliexs sign bilaterally. + Joplins neuritis noted to bilateral foot. LEFT>>Right   Vascular: Dorsalis Pedis artery on palpation: 2 Posterior Tibial artery on palpation: 2 Capillary Filling Times: WNL Peripheral edema: none  Dependency Rubor: mild Varicosities: mild/ mod  Dermatological: Skin quality: WNL bilaterally. No ulcerations, open wounds noted bilaterally. Interdigital spaces: C/D/I bilaterally. Hyperkeratotic FOCAL tissue noted: sub 1st on the Left - medial pinch callus   Musculoskeletal: Overall foot type: HAV bilaterally -  Toes abutting/ over-ridding: left abutting the 2nd digit  Global foot - TTP: 1st MPJ  Tendons: Achilles, Posterior Tibialis, Anterior Tibialis, Peroneals, EDL/ FDL, EHL/ FDL. Palpated and felt to be intact and without pain along tendon course. Muscle strength: 5/5 in all 4 quadrants Ankle Joint ROM: 80 - cannot get to 90  STJ ROM: WNL   1st TMT ROM: WNL  1st MPJ: ROM: < 20 degrees total on the RIGHT ; fairly rigid < 10 on the LEFT ; HIPJ with uptake of additional ROM for patient on ambulation DF - (N = 70-90 degrees) PF - (N = 45 degrees) Medial bump TTP - ++++ LEFT >> RIGHT  Trackbound deformity ++    XR IMAGING: 3 views of the BILATERAL foot obtained. DATE: 12/29/2023  Calcaneal Inclination Angle (N = 20-30): WNL Cyma Line (N = Congruous ; A= pes planus): CONGRUOUS  Intermetatarsal Angle (N = 8-12): increased L > R  Hallux ABDuctus Angle (  N = 15): increased Hallux ABDuctus INTERphalangeus Angle (N= 8- 10) WNL  1st MPJ joint space: Obliterate 1st MPJ alignment: subluxed   There is significant dorsomedial spurring of the RIGHT hallux at MPJ and dorsal spurring moderate noted bilaterally.   Impression:  3 views of the foot with noted abnormal deviation within the first ray (as described above) that clinically correlates to Hallux Abductovalgus deformity of the foot with significant  arthritis noted at the 1st MPJ. The impression of the radiographs were discussed with patient with additional reference to clinical symptoms and potential treatment options.     Assessment/Plan:   Assessment & Plan  Encounter Diagnoses  Name Primary?  . Bunion, right foot   . Bunion, left foot Yes     This visit today consisted of multiple elements: Number and complexity of problems addressed and the Risk of Morbidity/Mortality are both: LOW  Number and complexity of problems addressed: 1 acute uncomplicated injury  I have determined the complexity of the problem and Condition(s), etiologies, and options for care, treatment plan, and prognosis were reviewed with the patient. Upon discussion of above, treatment plan was devised and agreed upon by provider and patient. Both conservative and surgical options for care were discussed. All questions were answered in detail.  - Radiographs Obtained: 3 Views taken and reviewed of the BILATERAL FOOT: AP, Lat, Oblique Please see PE for full read. Impression discussed with patient with additional correlation to clinical symptoms and potential treatment options.  Conservative treatment options discussed with patient, including but not limited to: ORTHOTICS/ SHOE GEAR / STRETCHING / MEDICATIONS / PHYSICAL THERAPY / INJECTION  - Discussed usual escalation of treatment for this current issue, primarily the conservative modalities we are able to employ, including: shoe gear, orthotics, contrast baths, icing, topical NSAIDs, PO NSAID, corticosteroid injection, Physical therapy, and last resort of surgical intervention.  STRETCHING - continue* - Discussed importance of stretching and role played with limited ankle DF / tightness of the gastroc complex on the increased pressure to the 1st MPJ. Educated on appropriate GS / calf stretches.   ORTHOTICS/ SHOEGEAR- continue* - Educated on importance of proper / supportive shoe gear. Demonstrated / discussed  shoe stability test and relationship to pressure distribution in the foot. - Recommended initial trial with OTC orthotics. Should patient identify benefit of orthotic but desire a more aggressive support apparatus may discuss custom orthotics at that time. **Has goodfeet orthotics that she is to continue using at all times x 3 weeks OR use HOKA at all times - discussed with patient.  - Educated that patient may gain symptomatic relief with these modalities, as well as potentially delaying progression of deformity, however this will not correct the physical deformity that already exists.  MEDICATION - RX Meloxicam 15mg  PO QD x 30 days - PRN USE. To take with meal. Take the same time every day.  - Patient has been educated about the Moderate risks associated with oral anti-inflammatories. Patient instructed on appropriate use (described above). Patient educated about possible side effects/ adverse reactions and advised to stop medication and call office if any arise. A prescription was written today and will be medically managed. - Discussed benefit of local injection to reduce inflammation/ gain symptomatic relief for joint related bunion pain. This injection can be employed during any point during treatment and patient was offered with caveat of only 4 injections a year are permitted as well as associated r/b/a to injections.    Patient has expressed that though the  measures employed are assisting with her current acute symptoms, she believes that the problem will continue and has become worse given the progressive deformity. She is unclear currently on dates that would work for surgical intervention but she would like to start that process and have surgery done prior to end of December if possible but after Oct 11th (daughters baby shower).     ASSESSMENT AND PLAN: This visit today consisted of multiple elements: Number and complexity of problems addressed: Moderate risk  Risk of  morbidity\mortality from additional diagnostic testing or treatment: Moderate risk   I have determined the complexity of the problem and Condition(s), etiologies, and options for care, treatment plan, and prognosis were reviewed with the patient. Condition, etiologies, and options for care, treatment plan, and prognosis have all been reviewed with the patient. Conservative and surgical options for care have been discussed and patient was advised of the different treatment options available. R/B/A discussion held. All questions were answered in detail to patient satisfaction.   Given the inability for prior conservative treatment to adequately address the patients condition, patient has opted to discuss surgical intervention.  Patient educated about continued conservative care vs. surgical intervention, and the patient has elected for surgical management of this condition. Patient has been advised of the approximate disability period involved for these procedures. In addition, the patient has been advised as to the alternatives of care including but not limited to; continued conservative care as well as surgical procedures (Lapidus + 1st MPJ arthrodesis ; Implant / 1st MPJ arthroplasty ; Austin decompression / Akin ; +/- weil osteotomies and hammer toe corrections) Patient understands disability period, post-operative restrictions, and recovery timeline. Patient aware that joint-sparing options may preserve motion but are unlikely to relieve pain. Informed of possible need for grafting due to anticipated shortening and potential multi-procedure approach.  The patient verbalized their understanding of the Moderate risks and complications that could occur, including to but not limited to: numbness, nerve-related issues (i.e. transient neuritis vs. CRPS), pain (acute & chronic), injury to adjacent tissues / vessels / structures, infection, edema, DVT, scar formation, the possible need for additional surgical  intervention, as well as potential anesthesia related complications.  If hardware is associated with the case - there may be risk for avascular necrosis, non-union, &/or mal-union.   Below details some of the information specific to this case that has been discussed and reviewed by both the patient and the provider:   [x]  Planned procedure:  Proceed with surgical planning for first MPJ arthrodesis with possible grafting. Consider adjunctive Weil osteotomy and hammer toe correction (digits 2 and 3) if indicated intraoperatively. Surgery tentatively scheduled between October 11 and December 31 per patient's preference  [x]  Imaging Obtained:  Date: 12/29/2023 Type: XR  [discussed] Planned WB course: NWB x 4 weeks at minimum   [no] Pre-op - Done Today. Additional medical clearance needed: yes - form to be faxed.  Diagnostics & Optimization Obtain medical clearance from primary care provider (form to be faxed).  [need] Labs:  Order pre-operative labs including CBC, CMP, coagulation profile. Check Vitamin D levels to ensure optimal bone health and fusion potential.  [TBD] Surgical Consent: tbd pending clearance/ labs     Conservative Measures (Interim) Continue current symptom management with shoe gear modifications, padding, activity modification, and anti-inflammatories as tolerated. Use of supportive bracing/orthoses as needed for pain relief. Patient Education & Counseling Patient counseled extensively on risks, benefits, alternatives of both conservative and surgical management.   Follow-up Begin pre-operative  clearance process today. Schedule pre-op appointment once clearance and labs obtained. Continue close follow-up until surgery date; return sooner for worsening pain or new concerns.

## 2024-01-29 ENCOUNTER — Other Ambulatory Visit: Payer: Self-pay | Admitting: Medical Genetics

## 2024-02-01 ENCOUNTER — Encounter

## 2024-02-09 NOTE — Anesthesia Preprocedure Evaluation (Addendum)
 Anesthesia Evaluation  Patient identified by MRN, date of birth, ID band Patient awake    Reviewed: Allergy & Precautions, H&P , NPO status , Patient's Chart, lab work & pertinent test results  Airway Mallampati: II  TM Distance: <3 FB Neck ROM: Full    Dental no notable dental hx.    Pulmonary former smoker   Pulmonary exam normal breath sounds clear to auscultation       Cardiovascular hypertension, + Valvular Problems/Murmurs  Rhythm:Regular Rate:Normal + Systolic murmurs    Neuro/Psych  PSYCHIATRIC DISORDERS Anxiety Depression Bipolar Disorder   negative neurological ROS  negative psych ROS   GI/Hepatic negative GI ROS, Neg liver ROS,GERD  ,,  Endo/Other  negative endocrine ROS    Renal/GU negative Renal ROS  negative genitourinary   Musculoskeletal negative musculoskeletal ROS (+) Arthritis ,    Abdominal   Peds negative pediatric ROS (+)  Hematology negative hematology ROS (+) Blood dyscrasia, anemia   Anesthesia Other Findings Surgeon requests pop/saph block preop  Hypertension  Skin cancer Anemia  Anxiety Arthritis  Blood transfusion without reported diagnosis Depression  GERD (gastroesophageal reflux disease) Heart murmur  Basal cell carcinoma Basal cell carcinoma (BCC)  Squamous cell carcinoma of skin Restless leg syndrome  Former smoker Generalized anxiety disorder  Hiatal hernia   Reproductive/Obstetrics negative OB ROS                              Anesthesia Physical Anesthesia Plan  ASA: 2  Anesthesia Plan: General ETT   Post-op Pain Management:    Induction: Intravenous, Cricoid pressure planned and Rapid sequence  PONV Risk Score and Plan:   Airway Management Planned: Oral ETT  Additional Equipment:   Intra-op Plan:   Post-operative Plan: Extubation in OR  Informed Consent: I have reviewed the patients History and Physical, chart, labs and  discussed the procedure including the risks, benefits and alternatives for the proposed anesthesia with the patient or authorized representative who has indicated his/her understanding and acceptance.     Dental Advisory Given  Plan Discussed with: Anesthesiologist, CRNA and Surgeon  Anesthesia Plan Comments: (Patient consented for risks of anesthesia including but not limited to:  - adverse reactions to medications - damage to eyes, teeth, lips or other oral mucosa - nerve damage due to positioning  - sore throat or hoarseness - Damage to heart, brain, nerves, lungs, other parts of body or loss of life  Patient voiced understanding and assent.)         Anesthesia Quick Evaluation

## 2024-02-15 ENCOUNTER — Other Ambulatory Visit: Payer: Self-pay

## 2024-02-17 ENCOUNTER — Other Ambulatory Visit: Payer: Self-pay

## 2024-02-17 NOTE — Progress Notes (Signed)
 Below details some of the information specific to this case that has been discussed and reviewed by both the patient and the provider:   [x]  Planned procedure: Left first metatarsal phalangeal joint arthrodesis  [x]  Imaging Obtained:  Date: Type:   [x]  Planned WB course: NWB in cam boot  [x]  Surgical Consent: Signed and documented in chart.    [x]  POST-OP PREPPING:   - Pain Medication:  [x]  Hydrocodone /APAP [NORCO] 5/325mg  Tab #24 Q4-6H prn pain ; advised not to take APAP products concurrently. + Gabapentin  300mg QHS x 3 days; to initiate 1st day post-op.   - DVT ppx: [x]  ASA 81mg  BID - Bowel Regimen: Senna 15mg  QD x 5 days prn constipation  -Anti-Nausea Regimen: Ondansetron  4mg  BID x 5 days prn nausea - Vitamin D - TBD OTC currently - will discuss with patient to review options The patient was given the opportunity to ask questions, which were answered to the best of my ability. The patient is to call if any further questions arise

## 2024-02-17 NOTE — Discharge Instructions (Signed)

## 2024-02-18 ENCOUNTER — Ambulatory Visit: Payer: Self-pay | Admitting: Anesthesiology

## 2024-02-18 ENCOUNTER — Encounter: Admission: RE | Disposition: A | Discharge status: Home / Self Care | Payer: Self-pay | Source: Home / Self Care

## 2024-02-18 ENCOUNTER — Ambulatory Visit: Payer: Self-pay

## 2024-02-18 ENCOUNTER — Ambulatory Visit: Admission: RE | Admit: 2024-02-18 | Discharge: 2024-02-18 | Disposition: A | Discharge status: Home / Self Care

## 2024-02-18 ENCOUNTER — Other Ambulatory Visit: Payer: Self-pay

## 2024-02-18 DIAGNOSIS — Z87891 Personal history of nicotine dependence: Secondary | ICD-10-CM | POA: Diagnosis not present

## 2024-02-18 DIAGNOSIS — I10 Essential (primary) hypertension: Secondary | ICD-10-CM | POA: Diagnosis not present

## 2024-02-18 DIAGNOSIS — F319 Bipolar disorder, unspecified: Secondary | ICD-10-CM | POA: Insufficient documentation

## 2024-02-18 DIAGNOSIS — M21612 Bunion of left foot: Secondary | ICD-10-CM | POA: Insufficient documentation

## 2024-02-18 DIAGNOSIS — M2042 Other hammer toe(s) (acquired), left foot: Secondary | ICD-10-CM | POA: Diagnosis present

## 2024-02-18 DIAGNOSIS — M2012 Hallux valgus (acquired), left foot: Secondary | ICD-10-CM | POA: Insufficient documentation

## 2024-02-18 DIAGNOSIS — M2022 Hallux rigidus, left foot: Secondary | ICD-10-CM | POA: Insufficient documentation

## 2024-02-18 DIAGNOSIS — K219 Gastro-esophageal reflux disease without esophagitis: Secondary | ICD-10-CM | POA: Diagnosis not present

## 2024-02-18 DIAGNOSIS — Z85828 Personal history of other malignant neoplasm of skin: Secondary | ICD-10-CM | POA: Diagnosis not present

## 2024-02-18 HISTORY — PX: WEIL OSTEOTOMY: SHX5044

## 2024-02-18 HISTORY — DX: Restless legs syndrome: G25.81

## 2024-02-18 HISTORY — PX: FOOT ARTHRODESIS: SHX1655

## 2024-02-18 HISTORY — DX: Generalized anxiety disorder: F41.1

## 2024-02-18 HISTORY — DX: Personal history of nicotine dependence: Z87.891

## 2024-02-18 HISTORY — DX: Diaphragmatic hernia without obstruction or gangrene: K44.9

## 2024-02-18 SURGERY — FUSION, JOINT, FOOT
Anesthesia: General | Site: Foot | Laterality: Left

## 2024-02-18 MED ORDER — DEXAMETHASONE SODIUM PHOSPHATE 4 MG/ML IJ SOLN
INTRAMUSCULAR | Status: DC | PRN
  Administered 2024-02-18: 4 mg via INTRAVENOUS

## 2024-02-18 MED ORDER — DEXAMETHASONE SOD PHOSPHATE PF 10 MG/ML IJ SOLN
INTRAMUSCULAR | Status: DC | PRN
Start: 2024-02-18 — End: 2024-02-18
  Administered 2024-02-18 (×2): 5 mg via PERINEURAL

## 2024-02-18 MED ORDER — EPHEDRINE SULFATE-NACL 50-0.9 MG/10ML-% IV SOSY
PREFILLED_SYRINGE | INTRAVENOUS | Status: DC | PRN
Start: 2024-02-18 — End: 2024-02-18
  Administered 2024-02-18: 10 mg via INTRAVENOUS

## 2024-02-18 MED ORDER — ONDANSETRON HCL 4 MG/2ML IJ SOLN
INTRAMUSCULAR | Status: DC | PRN
  Administered 2024-02-18: 4 mg via INTRAVENOUS

## 2024-02-18 MED ORDER — BUPIVACAINE HCL (PF) 0.25 % IJ SOLN
INTRAMUSCULAR | Status: DC | PRN
  Administered 2024-02-18 (×2): 10 mL via PERINEURAL

## 2024-02-18 MED ORDER — BUPIVACAINE LIPOSOME 1.3 % IJ SUSP
INTRAMUSCULAR | Status: AC
  Filled 2024-02-18: qty 10

## 2024-02-18 MED ORDER — MIDAZOLAM HCL (PF) 2 MG/2ML IJ SOLN
2.0000 mg | INTRAMUSCULAR | Status: DC | PRN
  Administered 2024-02-18: 2 mg via INTRAVENOUS

## 2024-02-18 MED ORDER — FENTANYL CITRATE (PF) 100 MCG/2ML IJ SOLN
100.0000 ug | Freq: Once | INTRAMUSCULAR | Status: AC
  Administered 2024-02-18: 100 ug via INTRAVENOUS

## 2024-02-18 MED ORDER — FENTANYL CITRATE (PF) 100 MCG/2ML IJ SOLN
INTRAMUSCULAR | Status: AC
  Filled 2024-02-18: qty 2

## 2024-02-18 MED ORDER — PROPOFOL 1000 MG/100ML IV EMUL
INTRAVENOUS | Status: AC
  Filled 2024-02-18: qty 100

## 2024-02-18 MED ORDER — CEFAZOLIN SODIUM-DEXTROSE 2-3 GM-%(50ML) IV SOLR
INTRAVENOUS | Status: AC
  Filled 2024-02-18: qty 50

## 2024-02-18 MED ORDER — PROPOFOL 10 MG/ML IV BOLUS
INTRAVENOUS | Status: AC
  Filled 2024-02-18: qty 20

## 2024-02-18 MED ORDER — MIDAZOLAM HCL 2 MG/2ML IJ SOLN
INTRAMUSCULAR | Status: AC
  Filled 2024-02-18: qty 2

## 2024-02-18 MED ORDER — BUPIVACAINE LIPOSOME 1.3 % IJ SUSP
INTRAMUSCULAR | Status: DC | PRN
  Administered 2024-02-18: 8 mL

## 2024-02-18 MED ORDER — LACTATED RINGERS IV SOLN: INTRAVENOUS | Status: DC

## 2024-02-18 MED ORDER — LIDOCAINE HCL (PF) 2 % IJ SOLN
INTRAMUSCULAR | Status: AC
  Filled 2024-02-18: qty 2

## 2024-02-18 MED ORDER — LIDOCAINE HCL (CARDIAC) PF 100 MG/5ML IV SOSY
PREFILLED_SYRINGE | INTRAVENOUS | Status: DC | PRN
  Administered 2024-02-18: 60 mg via INTRATRACHEAL

## 2024-02-18 MED ORDER — 0.9 % SODIUM CHLORIDE (POUR BTL) OPTIME
TOPICAL | Status: DC | PRN
Start: 2024-02-18 — End: 2024-02-18
  Administered 2024-02-18: 1000 mL

## 2024-02-18 MED ORDER — SUCCINYLCHOLINE CHLORIDE 200 MG/10ML IV SOSY
PREFILLED_SYRINGE | INTRAVENOUS | Status: DC | PRN
  Administered 2024-02-18: 140 mg via INTRAVENOUS

## 2024-02-18 MED ORDER — CEFAZOLIN SODIUM-DEXTROSE 2-4 GM/100ML-% IV SOLN
2.0000 g | INTRAVENOUS | Status: AC
  Administered 2024-02-18: 2 g via INTRAVENOUS

## 2024-02-18 MED ORDER — BUPIVACAINE HCL (PF) 0.5 % IJ SOLN
INTRAMUSCULAR | Status: DC | PRN
  Administered 2024-02-18 (×2): 10 mL via PERINEURAL

## 2024-02-18 MED ORDER — PHENYLEPHRINE HCL (PRESSORS) 10 MG/ML IV SOLN
INTRAVENOUS | Status: DC | PRN
  Administered 2024-02-18: 80 ug via INTRAVENOUS

## 2024-02-18 MED ORDER — LIDOCAINE HCL (PF) 2 % IJ SOLN
INTRAMUSCULAR | Status: DC | PRN
Start: 2024-02-18 — End: 2024-02-18
  Administered 2024-02-18 (×2): .5 mL via INTRADERMAL

## 2024-02-18 MED ORDER — PROPOFOL 10 MG/ML IV BOLUS
INTRAVENOUS | Status: DC | PRN
  Administered 2024-02-18: 150 mg via INTRAVENOUS

## 2024-02-18 SURGICAL SUPPLY — 44 items
BIT DRILL 2 FENESTRATED (MISCELLANEOUS) IMPLANT
BIT DRILL CANNULATED 2.3MM (DRILL) IMPLANT
BLADE MED AGGRESSIVE (BLADE) IMPLANT
BNDG COMPR 6X5.8 VLCR NS LF (GAUZE/BANDAGES/DRESSINGS) ×2 IMPLANT
BNDG ELASTIC 4X5.8 VLCR NS LF (GAUZE/BANDAGES/DRESSINGS) ×2 IMPLANT
BNDG ESMARCH 4X12 STRL LF (GAUZE/BANDAGES/DRESSINGS) ×2 IMPLANT
BNDG GAUZE DERMACEA FLUFF 4 (GAUZE/BANDAGES/DRESSINGS) ×2 IMPLANT
BOOT WALKER MEDIUM (SOFTGOODS) IMPLANT
CANISTER SUCT 1200ML W/VALVE (MISCELLANEOUS) ×2 IMPLANT
CONE REAMER 21 (MISCELLANEOUS) IMPLANT
COVER LIGHT HANDLE UNIVERSAL (MISCELLANEOUS) ×4 IMPLANT
CUFF TOURN SGL QUICK 18X4 (TOURNIQUET CUFF) IMPLANT
DRAPE FLUOR MINI C-ARM 54X84 (DRAPES) ×2 IMPLANT
DRSG EMULSION OIL 3X3 NADH (GAUZE/BANDAGES/DRESSINGS) IMPLANT
DURAPREP 26ML APPLICATOR (WOUND CARE) ×2 IMPLANT
ELECTRODE REM PT RTRN 9FT ADLT (ELECTROSURGICAL) ×2 IMPLANT
GAUZE SPONGE 4X4 12PLY STRL (GAUZE/BANDAGES/DRESSINGS) ×2 IMPLANT
GAUZE XEROFORM 1X8 LF (GAUZE/BANDAGES/DRESSINGS) ×2 IMPLANT
GLOVE BIOGEL PI IND STRL 6.5 (GLOVE) ×2 IMPLANT
GLOVE SURG SS PI 6.5 STRL IVOR (GLOVE) ×2 IMPLANT
GOWN STRL REUS W/ TWL LRG LVL3 (GOWN DISPOSABLE) ×4 IMPLANT
GRAFT DMB PUTTY 1 OPTIUM FD (Bone Implant) IMPLANT
KIT TURNOVER KIT A (KITS) ×2 IMPLANT
KWIRE SMOOTH 1.6X150MM (WIRE) IMPLANT
KWIRE SNGL END 1.2X150 (MISCELLANEOUS) IMPLANT
NS IRRIG 500ML POUR BTL (IV SOLUTION) ×2 IMPLANT
PACK EXTREMITY ARMC (MISCELLANEOUS) ×2 IMPLANT
PAD ABD DERMACEA PRESS 5X9 (GAUZE/BANDAGES/DRESSINGS) IMPLANT
PADDING CAST BLEND 4X4 NS (MISCELLANEOUS) ×6 IMPLANT
PENCIL SMOKE EVACUATOR (MISCELLANEOUS) ×2 IMPLANT
PLATE MTP MED L AERO DEGREE (Plate) IMPLANT
SCREW 3.5X16 NONLOCKING (Screw) IMPLANT
SCREW CANN ST 3.5X26 (Screw) IMPLANT
SCREW COUNTERSINK HEADED 3.5 (ORTHOPEDIC DISPOSABLE SUPPLIES) IMPLANT
SCREW LOCK PLATE R3 2.7X10 (Screw) IMPLANT
SCREW LOCK PLATE R3 2.7X12 (Screw) IMPLANT
SCREW LOCK PLATE R3 2.7X14 (Screw) IMPLANT
SCREW LOCK PLATE R3 2.7X15 (Screw) IMPLANT
SCREW NON LOCKING 2.7X18 (Screw) IMPLANT
SPLINT CAST 1 STEP 4X30 (MISCELLANEOUS) ×2 IMPLANT
STOCKINETTE IMPERVIOUS LG (DRAPES) ×2 IMPLANT
SUT VIC AB 3-0 SH 27X BRD (SUTURE) IMPLANT
SUT VIC AB 4-0 FS2 27 (SUTURE) IMPLANT
WIRE OLIVE SMOOTH 1.4MMX60MM (WIRE) IMPLANT

## 2024-02-18 NOTE — Addendum Note (Signed)
 Addendum  created 02/18/24 1420 by Ola Donny BROCKS, MD   Child order released for a procedure order, Clinical Note Signed, Intraprocedure Blocks edited, Intraprocedure Meds edited, SmartForm saved

## 2024-02-18 NOTE — Anesthesia Procedure Notes (Signed)
 Anesthesia Regional Block: Adductor canal block   Pre-Anesthetic Checklist: , timeout performed,  Correct Patient, Correct Site, Correct Laterality,  Correct Procedure, Correct Position, site marked,  Risks and benefits discussed,  Surgical consent,  Pre-op evaluation,  At surgeon's request and post-op pain management  Laterality: Lower and Left  Prep: chloraprep       Needles:  Injection technique: Single-shot  Needle Type: Echogenic Needle     Needle Length: 9cm  Needle Gauge: 21     Additional Needles:   Procedures:,,,, ultrasound used (permanent image in chart),,    Narrative:  Start time: 02/18/2024 7:33 AM End time: 02/18/2024 7:41 AM Injection made incrementally with aspirations every 5 mL.  Performed by: Personally  Anesthesiologist: Ola Donny BROCKS, MD  Additional Notes: Patient consented for risk and benefits of nerve block including but not limited to nerve damage, failed block, bleeding and infection.  Patient voiced understanding.  Functioning IV was confirmed and monitors were applied.  Timeout done prior to procedure and prior to any sedation being given to the patient.  Patient confirmed procedure site prior to any sedation given to the patient.  A 50mm 22ga Stimuplex needle was used. Sterile prep,hand hygiene and sterile gloves were used.  Minimal sedation used for procedure.  No paresthesia endorsed by patient during the procedure.  Negative aspiration and negative test dose prior to incremental administration of local anesthetic. The patient tolerated the procedure well with no immediate complications. See also note for popliteal block.

## 2024-02-18 NOTE — Op Note (Addendum)
 FOOT AND ANKLE SURGERY  OPERATIVE REPORT  SURGEON: Jini Horiuchi G. Tanda, DPM  ASSISTANT(S): Dr. Prentice Lee  PRE-OPERATIVE DIAGNOSIS:  Hallux Rigidus, left foot Hallux Abductovalgus, left foot  POST-OPERATIVE DIAGNOSIS: same  PROCEDURE(S): First metatarsophalangeal joint arthrodesis, left foot.   HEMOSTASIS: Ankle tourniquet set at 250 mmHg  ANESTHESIA: general + popliteal / saphenous block, + local block.   ESTIMATED BLOOD LOSS: 10 cc  FINDING(S): The first metatarsophalangeal joint was inspected and noted to be 85 % void of healthy cartilage. Excellent compression across the first metatarsophalangeal joint. Stable hardware construct.  Improved alignment / rectus positioning of the hallux. Reduction in first intermetatarsal angle.    PATHOLOGY/SPECIMEN(S):   None  INDICATIONS FOR PROCEDURE:  Lisa Vasquez is a 53 y.o. female with a past medical history significant for bipolar I, anxiety, HTN, h/o SCC, low B12 level that presented with painful big toe joint of the left foot that has been recalcitrant to conservative treatment. Both medical and surgical treatment options were presented to the patient, of which the patient elected to undergo surgical intervention of stated deformity. The procedure, alternatives, risks, and limitations in this individual case have been carefully discussed with the patient. All questions have been thoroughly answered and the patient understands the surgery indicated. The patient has requested that this surgical intervention be undertaken and a consent form was signed.  PROCEDURAL DETAILS: The patient was brought to the operating room and was transitioned to the operating room table in the supine position. A standard awake time-out was conducted to confirm the correct patient, procedure, side, and site. All team members were in agreement. Anesthesia team initiated general with preoperative popliteal / saphenous block to the surgical limb and administered  2g Ancef for prophylactic antibiosis.  An ankle tourniquet was then applied and set to . The extremity was then scrubbed, prepped and draped in the usual aseptic manner. After elevating and exsanguinating the operative extremity, the tourniquet was inflated to 250 mmHg.  Attention was directed to the dorsal aspect of the left first metatarsophalangeal joint where a dorsal longitudinal incision was made medial to the long extensor tendon of the hallux. Dissection was carried through skin and subcutaneous tissue to the level of the first metatarsophalangeal joint. All visible neurovascular structures were identified, retracted and protected throughout the duration of the procedure.  A dorsal linear capsulotomy was made and capsular tissue was reflected off the head of the metatarsal and the base of the proximal phalanx. The joint was 85 % void of healthy cartilage with notable significant cystic changes to the first metatarsal head. It exhibited peri-articular arthritic changes and dorsal osteophytosis. The extensor tendon was retracted medially and a standard lateral release was conducted. Rongeurs and sagittal saw were used to resect the medial and associated joint osteophytes; these were removed from the surgical site.   Using a combination of rongeur, curettes, and a hand held cup and cone reamer system - all cartilage and subchondral bone was systematically removed from the joint surfaces of the metatarsal head and proximal phalanx base with care taken to retain the joint contour. Cystic degeneration of the first metatarsal head was significant. The associated proximal phalanx had moderate cystic changes but integrity of the base shape was retained post joint prep. The surgical site was then flushed with copious amounts of NS saline. A K wire was used to fenestrate the subchondral plate and stimulate healthy subchondral bleeding. The joint was then further prepped with application of demineralized bone  matrix.  The foot was loaded plantigrade on a flat instrument tray to assess for proper hallux positioning. A K-wire was used for temporary fixation of hallux in rectus position, which clinically appeared to seat well.   A guidewire for the 3.59mm cannulated headed screw was placed from medial proximal phalanx base to lateral aspect of the first metatarsal head. Intraoperative fluoro confirmed appropriate positioning of wire and optimal position of hallux was confirmed clinically. The plate was then placed with temporary fixation all of wires at the dorsal aspect of the joint.  This was checked under fluoroscopic guidance and clinically appeared to be sitting in a good position overall.  The distal plate was then filled with 2.88mm locking, all the temporary fixation was then removed. The proximal compression hole in the plate was then filled with a nonlocking 2.30mm screw and good interfragmentary compression was achieved. The remaining 2 proximal screw holes were then filled with 2.45mm locking screws utilizing standard AO principles and techniques. Given the significant cystic changes, we opted to use a positional cross screw for additional support of the construct and the guidewire was used to insert a 3.11mm x 26mm cannulated screw. Guidewire removed. The first metatarsophalangeal joint construct was noted to be stable with the placed fixation. Alignment and screw position were verified under fluoroscopy and felt to be optimized.  The surgical site was then closed in layered fashion using 3-0 and 4-0 vicryl and 4-0 nylon for skin closure. Further local anesthesia of 8cc Exparel administered postoperatively to assist with pain control. The tourniquet was released and immediate hyperemia was noted in all the toes of the operative foot. A dry sterile dressing was applied consisting of betadine soaked adaptic, 4 x 4 gauze, ABD pad, Kerlix, and a 4-inch ACE.  Overall, the patient tolerated the procedure and  anesthesia well. They were transported to the recovery room with vital signs stable and vascular status intact to the operative foot. Once the patient was deemed appropriate by the recovery room staff, the patient was discharged to home in stable condition with the following physical and verbal post-operative instructions (below).   POST-OPERATIVE PLAN:  Patient was instructed to be NWB on the operative foot in a CAM boot. If necessary, she can put heel touch down for transfer / safety.  Activity: Limit excessive ambulation and dependency to the operative extremity. Elevate as much as possible until your post-operative appointment.  Pain control: A prescription was sent to the pharmacy. To be taken only as prescribed.  DVT ppx: ASA 81mg  BID  Follow up appointment: 1 week Podiatry clinic for incision check.

## 2024-02-18 NOTE — Anesthesia Postprocedure Evaluation (Signed)
 Anesthesia Post Note  Patient: Lisa Vasquez  Procedure(s) Performed: FUSION, JOINT, FOOT (Left: Foot) OSTEOTOMY, WEIL (Left)  Patient location during evaluation: PACU Anesthesia Type: General Level of consciousness: awake and alert Pain management: pain level controlled Vital Signs Assessment: post-procedure vital signs reviewed and stable Respiratory status: spontaneous breathing, nonlabored ventilation, respiratory function stable and patient connected to nasal cannula oxygen Cardiovascular status: blood pressure returned to baseline and stable Postop Assessment: no apparent nausea or vomiting Anesthetic complications: no Comments: Block working well   No notable events documented.   Last Vitals:  Vitals:   02/18/24 1015 02/18/24 1030  BP: 130/76 123/70  Pulse: 92 84  Resp: 10 14  Temp:    SpO2: 94% 96%    Last Pain:  Vitals:   02/18/24 1030  TempSrc:   PainSc: 0-No pain                 Cairo Lingenfelter C Camela Wich

## 2024-02-18 NOTE — Transfer of Care (Signed)
 Immediate Anesthesia Transfer of Care Note  Patient: Lisa Vasquez  Procedure(s) Performed: FUSION, JOINT, FOOT (Left: Foot) OSTEOTOMY, WEIL (Left)  Patient Location: PACU  Anesthesia Type: General ETT  Level of Consciousness: awake, alert  and patient cooperative  Airway and Oxygen Therapy: Patient Spontanous Breathing and Patient connected to supplemental oxygen  Post-op Assessment: Post-op Vital signs reviewed, Patient's Cardiovascular Status Stable, Respiratory Function Stable, Patent Airway and No signs of Nausea or vomiting  Post-op Vital Signs: Reviewed and stable  Complications: No notable events documented.

## 2024-02-18 NOTE — Brief Op Note (Signed)
 02/18/2024  10:26 AM  PATIENT:  Lisa Vasquez  53 y.o. female  PRE-OPERATIVE DIAGNOSIS:  Hallux rigidus of left foot M20.22 Hallux valgus, left M20.12 Hammer toe of left foot M20.42  POST-OPERATIVE DIAGNOSIS:  Hallux rigidus of left foot M20.22Hallux valgus, left M20.12Hammer toe of left foot M20.42  PROCEDURE:  Procedure(s): FUSION, JOINT, FOOT (Left) OSTEOTOMY, WEIL (Left)  SURGEON:  Surgeons and Role:    DEWAINE Blush, Greig MATSU, DPM - Primary    * Lennie Barter, DPM - Assisting   ANESTHESIA:   general  EBL:  minimal   BLOOD ADMINISTERED:none  DRAINS: none   LOCAL MEDICATIONS USED:  OTHER  SPECIMEN:  No Specimen  DISPOSITION OF SPECIMEN:  N/A  COUNTS:  YES  TOURNIQUET:   Total Tourniquet Time Documented: Ankle (Left) - 101 minutes Total: Ankle (Left) - 101 minutes   DICTATION: .Note written in EPIC  PLAN OF CARE: Discharge to home after PACU  PATIENT DISPOSITION:  PACU - hemodynamically stable.   Delay start of Pharmacological VTE agent (>24hrs) due to surgical blood loss or risk of bleeding: no

## 2024-02-18 NOTE — Interval H&P Note (Signed)
 History and Physical Interval Note:  02/18/2024 7:28 AM  Lisa Vasquez  has presented today for surgery, with the diagnosis of Hallux rigidus of left foot M20.22 Hallux valgus, left M20.12 Hammer toe of left foot M20.42.  The various methods of treatment have been discussed with the patient and family. After consideration of risks, benefits and other options for treatment, the patient has consented to  Procedure(s): FUSION, JOINT, FOOT (Left) OSTEOTOMY, WEIL (Left) as a surgical intervention.  The patient's history has been reviewed, patient examined, no change in status, stable for surgery.  I have reviewed the patient's chart and labs. Last H&P by primary care 01/07/2024 -  no changes noted.  Questions were answered to the patient's satisfaction.     Katheryne Gorr KANDICE Blush

## 2024-02-18 NOTE — Anesthesia Procedure Notes (Signed)
 Anesthesia Regional Block: Interscalene brachial plexus block   Pre-Anesthetic Checklist: , timeout performed,  Correct Patient, Correct Site, Correct Laterality,  Correct Procedure, Correct Position, site marked,  Risks and benefits discussed,  Surgical consent,  Pre-op evaluation,  At surgeon's request and post-op pain management  Laterality: Lower and Left  Prep: chloraprep       Needles:  Injection technique: Single-shot  Needle Type: Echogenic Needle     Needle Length: 9cm  Needle Gauge: 21     Additional Needles:   Procedures:,,,, ultrasound used (permanent image in chart),,    Narrative:  Start time: 02/18/2024 7:25 AM End time: 02/18/2024 7:33 AM Injection made incrementally with aspirations every 5 mL.  Performed by: Personally  Anesthesiologist: Ola Donny BROCKS, MD  Additional Notes: Patient consented for risk and benefits of nerve block including but not limited to nerve damage, failed block, bleeding and infection.  Patient voiced understanding.  Functioning IV was confirmed and monitors were applied.  Timeout done prior to procedure and prior to any sedation being given to the patient.  Patient confirmed procedure site prior to any sedation given to the patient.  A 50mm 22ga Stimuplex needle was used. Sterile prep,hand hygiene and sterile gloves were used.  Minimal sedation used for procedure.  No paresthesia endorsed by patient during the procedure.  Negative aspiration and negative test dose prior to incremental administration of local anesthetic. The patient tolerated the procedure well with no immediate complications. See also adductor canal note.

## 2024-02-18 NOTE — Anesthesia Procedure Notes (Signed)
 Procedure Name: Intubation Date/Time: 02/18/2024 8:06 AM  Performed by: Levy Harvey, CRNAPre-anesthesia Checklist: Patient identified, Emergency Drugs available, Suction available, Patient being monitored and Timeout performed Patient Re-evaluated:Patient Re-evaluated prior to induction Oxygen Delivery Method: Circle system utilized Preoxygenation: Pre-oxygenation with 100% oxygen Induction Type: IV induction Ventilation: Mask ventilation without difficulty Laryngoscope Size: McGrath and 3 Grade View: Grade I Tube type: Oral Tube size: 7.0 mm Number of attempts: 1 Airway Equipment and Method: LTA kit utilized Placement Confirmation: ETT inserted through vocal cords under direct vision, positive ETCO2 and breath sounds checked- equal and bilateral Tube secured with: Tape Dental Injury: Teeth and Oropharynx as per pre-operative assessment

## 2024-03-02 ENCOUNTER — Encounter

## 2024-04-01 ENCOUNTER — Ambulatory Visit
Admission: RE | Admit: 2024-04-01 | Discharge: 2024-04-01 | Disposition: A | Source: Ambulatory Visit | Attending: Family Medicine | Admitting: Family Medicine

## 2024-04-01 DIAGNOSIS — Z1231 Encounter for screening mammogram for malignant neoplasm of breast: Secondary | ICD-10-CM
